# Patient Record
Sex: Male | Born: 1950
Health system: Southern US, Community
[De-identification: ages and names within clinical notes are randomized; demographics above are authoritative.]

## PROBLEM LIST (undated history)

## (undated) DIAGNOSIS — I2510 Atherosclerotic heart disease of native coronary artery without angina pectoris: Secondary | ICD-10-CM

## (undated) DIAGNOSIS — K589 Irritable bowel syndrome without diarrhea: Secondary | ICD-10-CM

## (undated) DIAGNOSIS — C801 Malignant (primary) neoplasm, unspecified: Secondary | ICD-10-CM

## (undated) DIAGNOSIS — D126 Benign neoplasm of colon, unspecified: Secondary | ICD-10-CM

## (undated) DIAGNOSIS — Z8042 Family history of malignant neoplasm of prostate: Secondary | ICD-10-CM

## (undated) DIAGNOSIS — R911 Solitary pulmonary nodule: Secondary | ICD-10-CM

## (undated) DIAGNOSIS — I10 Essential (primary) hypertension: Secondary | ICD-10-CM

## (undated) DIAGNOSIS — K648 Other hemorrhoids: Secondary | ICD-10-CM

## (undated) DIAGNOSIS — E785 Hyperlipidemia, unspecified: Secondary | ICD-10-CM

## (undated) DIAGNOSIS — Z8052 Family history of malignant neoplasm of bladder: Secondary | ICD-10-CM

## (undated) DIAGNOSIS — Z8679 Personal history of other diseases of the circulatory system: Secondary | ICD-10-CM

## (undated) DIAGNOSIS — K219 Gastro-esophageal reflux disease without esophagitis: Secondary | ICD-10-CM

## (undated) DIAGNOSIS — N400 Enlarged prostate without lower urinary tract symptoms: Secondary | ICD-10-CM

## (undated) DIAGNOSIS — R3129 Other microscopic hematuria: Secondary | ICD-10-CM

## (undated) DIAGNOSIS — N419 Inflammatory disease of prostate, unspecified: Secondary | ICD-10-CM

## (undated) HISTORY — DX: Gilbert syndrome: E80.4

## (undated) HISTORY — DX: Solitary pulmonary nodule: R91.1

## (undated) HISTORY — DX: Personal history of other diseases of the circulatory system: Z86.79

## (undated) HISTORY — DX: Family history of malignant neoplasm of bladder: Z80.52

## (undated) HISTORY — DX: Hyperlipidemia, unspecified: E78.5

## (undated) HISTORY — PX: PROSTATE BIOPSY: SHX241

## (undated) HISTORY — PX: TONSILLECTOMY AND ADENOIDECTOMY: SUR1326

## (undated) HISTORY — DX: Benign neoplasm of colon, unspecified: D12.6

## (undated) HISTORY — DX: Other microscopic hematuria: R31.29

## (undated) HISTORY — DX: Atherosclerotic heart disease of native coronary artery without angina pectoris: I25.10

## (undated) HISTORY — PX: CYSTOSCOPY: SUR368

## (undated) HISTORY — DX: Other hemorrhoids: K64.8

## (undated) HISTORY — PX: VASECTOMY: SHX75

## (undated) HISTORY — DX: Irritable bowel syndrome, unspecified: K58.9

## (undated) HISTORY — DX: Inflammatory disease of prostate, unspecified: N41.9

## (undated) HISTORY — DX: Gastro-esophageal reflux disease without esophagitis: K21.9

## (undated) HISTORY — DX: Family history of malignant neoplasm of prostate: Z80.42

---

## 2004-05-14 HISTORY — PX: COLONOSCOPY W/ POLYPECTOMY: SHX1380

## 2004-06-23 ENCOUNTER — Ambulatory Visit: Payer: Self-pay | Admitting: Family Medicine

## 2004-07-28 ENCOUNTER — Ambulatory Visit: Payer: Self-pay | Admitting: Gastroenterology

## 2004-08-03 ENCOUNTER — Encounter: Payer: Self-pay | Admitting: Internal Medicine

## 2004-08-03 ENCOUNTER — Ambulatory Visit: Payer: Self-pay | Admitting: Gastroenterology

## 2005-01-12 ENCOUNTER — Ambulatory Visit: Payer: Self-pay | Admitting: Internal Medicine

## 2005-06-08 ENCOUNTER — Ambulatory Visit: Payer: Self-pay | Admitting: Internal Medicine

## 2005-12-19 ENCOUNTER — Ambulatory Visit: Payer: Self-pay | Admitting: Internal Medicine

## 2006-02-28 ENCOUNTER — Ambulatory Visit: Payer: Self-pay | Admitting: Internal Medicine

## 2006-02-28 LAB — CONVERTED CEMR LAB
AST: 19 units/L (ref 0–37)
Chol/HDL Ratio, serum: 4.4
Cholesterol: 184 mg/dL (ref 0–200)

## 2006-04-12 ENCOUNTER — Ambulatory Visit: Payer: Self-pay | Admitting: Internal Medicine

## 2006-08-01 ENCOUNTER — Ambulatory Visit: Payer: Self-pay | Admitting: Internal Medicine

## 2006-09-24 ENCOUNTER — Ambulatory Visit: Payer: Self-pay | Admitting: Internal Medicine

## 2006-09-24 LAB — CONVERTED CEMR LAB
ALT: 33 units/L (ref 0–40)
HDL: 43.3 mg/dL (ref >39.0)
Total CHOL/HDL Ratio: 3.8
Triglycerides: 105 mg/dL (ref 0–149)

## 2007-02-24 ENCOUNTER — Ambulatory Visit: Payer: Self-pay | Admitting: Internal Medicine

## 2007-02-24 LAB — CONVERTED CEMR LAB
Glucose, Urine, Semiquant: NEGATIVE
Ketones, urine, test strip: NEGATIVE
Nitrite: NEGATIVE
Protein, U semiquant: NEGATIVE
Specific Gravity, Urine: <1.005

## 2007-06-17 ENCOUNTER — Ambulatory Visit: Payer: Self-pay | Admitting: Internal Medicine

## 2007-07-18 ENCOUNTER — Ambulatory Visit: Payer: Self-pay | Admitting: Internal Medicine

## 2007-07-18 LAB — CONVERTED CEMR LAB
Cholesterol: 166 mg/dL (ref 0–200)
HDL: 52.1 mg/dL (ref >39.0)
LDL Cholesterol: 102 mg/dL — ABNORMAL HIGH (ref 0–99)
Total CHOL/HDL Ratio: 3.2
Triglycerides: 59 mg/dL (ref 0–149)

## 2007-07-31 ENCOUNTER — Ambulatory Visit: Payer: Self-pay | Admitting: Internal Medicine

## 2007-07-31 DIAGNOSIS — E7800 Pure hypercholesterolemia, unspecified: Secondary | ICD-10-CM | POA: Insufficient documentation

## 2007-07-31 DIAGNOSIS — E785 Hyperlipidemia, unspecified: Secondary | ICD-10-CM | POA: Insufficient documentation

## 2007-07-31 DIAGNOSIS — I10 Essential (primary) hypertension: Secondary | ICD-10-CM | POA: Insufficient documentation

## 2007-07-31 LAB — CONVERTED CEMR LAB
Glucose, Urine, Semiquant: NEGATIVE
HDL goal, serum: 40 mg/dL
Ketones, urine, test strip: NEGATIVE
LDL Goal: 130 mg/dL
Nitrite: NEGATIVE
Specific Gravity, Urine: 1.02
pH: 6.5

## 2008-02-19 ENCOUNTER — Telehealth (INDEPENDENT_AMBULATORY_CARE_PROVIDER_SITE_OTHER): Payer: Self-pay | Admitting: *Deleted

## 2008-02-25 ENCOUNTER — Encounter: Payer: Self-pay | Admitting: Internal Medicine

## 2008-03-10 ENCOUNTER — Ambulatory Visit: Payer: Self-pay | Admitting: Internal Medicine

## 2008-03-10 LAB — CONVERTED CEMR LAB
Bilirubin Urine: NEGATIVE
Ketones, urine, test strip: NEGATIVE
Nitrite: NEGATIVE
Specific Gravity, Urine: 1.01
Urobilinogen, UA: 0.2

## 2008-03-12 ENCOUNTER — Telehealth (INDEPENDENT_AMBULATORY_CARE_PROVIDER_SITE_OTHER): Payer: Self-pay | Admitting: *Deleted

## 2008-03-12 ENCOUNTER — Encounter (INDEPENDENT_AMBULATORY_CARE_PROVIDER_SITE_OTHER): Payer: Self-pay | Admitting: *Deleted

## 2008-03-12 LAB — CONVERTED CEMR LAB: PSA: 13.08 ng/mL — ABNORMAL HIGH (ref 0.10–4.00)

## 2008-04-05 ENCOUNTER — Encounter: Payer: Self-pay | Admitting: Internal Medicine

## 2008-04-16 ENCOUNTER — Encounter: Payer: Self-pay | Admitting: Internal Medicine

## 2008-05-21 ENCOUNTER — Ambulatory Visit: Payer: Self-pay | Admitting: Internal Medicine

## 2008-08-26 ENCOUNTER — Ambulatory Visit: Payer: Self-pay | Admitting: Internal Medicine

## 2008-09-13 ENCOUNTER — Ambulatory Visit: Payer: Self-pay | Admitting: Internal Medicine

## 2008-09-13 LAB — CONVERTED CEMR LAB
Cholesterol: 189 mg/dL (ref 0–200)
LDL Cholesterol: 114 mg/dL — ABNORMAL HIGH (ref 0–99)
VLDL: 20.6 mg/dL (ref 0.0–40.0)

## 2008-09-14 ENCOUNTER — Encounter (INDEPENDENT_AMBULATORY_CARE_PROVIDER_SITE_OTHER): Payer: Self-pay | Admitting: *Deleted

## 2008-09-20 ENCOUNTER — Ambulatory Visit: Payer: Self-pay | Admitting: Internal Medicine

## 2009-02-04 ENCOUNTER — Ambulatory Visit: Payer: Self-pay | Admitting: Internal Medicine

## 2009-02-04 DIAGNOSIS — B009 Herpesviral infection, unspecified: Secondary | ICD-10-CM | POA: Insufficient documentation

## 2009-02-04 LAB — CONVERTED CEMR LAB
Bilirubin Urine: NEGATIVE
Glucose, Urine, Semiquant: NEGATIVE
Ketones, urine, test strip: NEGATIVE
pH: 6

## 2009-04-19 ENCOUNTER — Ambulatory Visit: Payer: Self-pay | Admitting: Family Medicine

## 2009-09-28 ENCOUNTER — Telehealth (INDEPENDENT_AMBULATORY_CARE_PROVIDER_SITE_OTHER): Payer: Self-pay | Admitting: *Deleted

## 2009-10-03 ENCOUNTER — Ambulatory Visit: Payer: Self-pay | Admitting: Internal Medicine

## 2009-10-03 LAB — CONVERTED CEMR LAB
Bilirubin Urine: NEGATIVE
Glucose, Urine, Semiquant: NEGATIVE
Specific Gravity, Urine: 1.02
WBC Urine, dipstick: NEGATIVE
pH: 5

## 2009-10-08 LAB — CONVERTED CEMR LAB
ALT: 19 units/L (ref 0–53)
AST: 21 units/L (ref 0–37)
Alkaline Phosphatase: 67 units/L (ref 39–117)
BUN: 17 mg/dL (ref 6–23)
Basophils Absolute: 0 10*3/uL (ref 0.0–0.1)
Bilirubin, Direct: 0.2 mg/dL (ref 0.0–0.3)
Cholesterol: 177 mg/dL (ref 0–200)
Creatinine, Ser: 0.9 mg/dL (ref 0.4–1.5)
GFR calc non Af Amer: 88.11 mL/min (ref >60)
Glucose, Bld: 100 mg/dL — ABNORMAL HIGH (ref 70–99)
HCT: 43.4 % (ref 39.0–52.0)
Lymphs Abs: 1.6 10*3/uL (ref 0.7–4.0)
Monocytes Absolute: 0.5 10*3/uL (ref 0.1–1.0)
Monocytes Relative: 7.7 % (ref 3.0–12.0)
Neutrophils Relative %: 63.4 % (ref 43.0–77.0)
PSA: 2.27 ng/mL (ref 0.10–4.00)
Platelets: 210 10*3/uL (ref 150.0–400.0)
Potassium: 4.2 meq/L (ref 3.5–5.1)
RDW: 12.6 % (ref 11.5–14.6)
TSH: 0.48 microintl units/mL (ref 0.35–5.50)
Total Bilirubin: 1.9 mg/dL — ABNORMAL HIGH (ref 0.3–1.2)
Total Protein: 7 g/dL (ref 6.0–8.3)

## 2009-10-14 ENCOUNTER — Encounter (INDEPENDENT_AMBULATORY_CARE_PROVIDER_SITE_OTHER): Payer: Self-pay | Admitting: *Deleted

## 2009-10-14 ENCOUNTER — Ambulatory Visit: Payer: Self-pay | Admitting: Internal Medicine

## 2009-10-14 LAB — CONVERTED CEMR LAB: OCCULT 2: NEGATIVE

## 2009-10-20 ENCOUNTER — Ambulatory Visit: Payer: Self-pay | Admitting: Internal Medicine

## 2009-10-20 DIAGNOSIS — Z860100 Personal history of colon polyps, unspecified: Secondary | ICD-10-CM | POA: Insufficient documentation

## 2009-10-20 DIAGNOSIS — Z87448 Personal history of other diseases of urinary system: Secondary | ICD-10-CM | POA: Insufficient documentation

## 2009-10-20 DIAGNOSIS — L570 Actinic keratosis: Secondary | ICD-10-CM | POA: Insufficient documentation

## 2009-10-25 ENCOUNTER — Encounter (INDEPENDENT_AMBULATORY_CARE_PROVIDER_SITE_OTHER): Payer: Self-pay | Admitting: *Deleted

## 2009-11-17 ENCOUNTER — Encounter (INDEPENDENT_AMBULATORY_CARE_PROVIDER_SITE_OTHER): Payer: Self-pay

## 2009-11-22 ENCOUNTER — Ambulatory Visit: Payer: Self-pay | Admitting: Internal Medicine

## 2009-12-06 ENCOUNTER — Ambulatory Visit: Payer: Self-pay | Admitting: Internal Medicine

## 2010-02-10 ENCOUNTER — Ambulatory Visit: Payer: Self-pay | Admitting: Internal Medicine

## 2010-02-10 LAB — CONVERTED CEMR LAB
Bilirubin Urine: NEGATIVE
Ketones, urine, test strip: NEGATIVE
Protein, U semiquant: NEGATIVE
Urobilinogen, UA: 0.2

## 2010-02-17 ENCOUNTER — Encounter (INDEPENDENT_AMBULATORY_CARE_PROVIDER_SITE_OTHER): Payer: Self-pay | Admitting: *Deleted

## 2010-04-04 ENCOUNTER — Ambulatory Visit: Payer: Self-pay | Admitting: Internal Medicine

## 2010-04-04 DIAGNOSIS — K589 Irritable bowel syndrome without diarrhea: Secondary | ICD-10-CM | POA: Insufficient documentation

## 2010-04-04 LAB — CONVERTED CEMR LAB
IgA: 326 mg/dL (ref 68–378)
Tissue Transglutaminase Ab, IgA: 6 units (ref <20)

## 2010-04-05 ENCOUNTER — Telehealth: Payer: Self-pay | Admitting: Internal Medicine

## 2010-05-19 ENCOUNTER — Ambulatory Visit
Admission: RE | Admit: 2010-05-19 | Discharge: 2010-05-19 | Payer: Self-pay | Source: Home / Self Care | Attending: Internal Medicine | Admitting: Internal Medicine

## 2010-06-14 NOTE — Letter (Signed)
Summary: [facility] Instructions  [facility]  [address] [place], [place] [address]   Phone: [phone]  Fax: [phone]       Nicholas Paul    20-Nov-1950    MRN: [id number]        Procedure Day /Date:  Tuesday 12/06/2009     Arrival Time:  3:00pm     Procedure Time:  4:00pm     Location of Procedure:                    _x _   [facility] (4th Floor)                        PREPARATION FOR COLONOSCOPY WITH MOVIPREP   Starting 5 days prior to your procedure Thursday 12/01/2009 do not eat nuts, seeds, popcorn, corn, beans, peas,  salads, or any raw vegetables.  Do not take any fiber supplements (e.g. Metamucil, Citrucel, and Benefiber).  THE DAY BEFORE YOUR PROCEDURE         DATE: Monday 7/25  1.  Drink clear liquids the entire day-NO SOLID FOOD  2.  Do not drink anything colored red or purple.  Avoid juices with pulp.  No orange juice.  3.  Drink at least 64 oz. (8 glasses) of fluid/clear liquids during the day to prevent dehydration and help the prep work efficiently.  CLEAR LIQUIDS INCLUDE: Water Jello Ice Popsicles Tea (sugar ok, no milk/cream) Powdered fruit flavored drinks Coffee (sugar ok, no milk/cream) Gatorade Juice: apple, white grape, white cranberry  Lemonade Clear bullion, consomm, broth Carbonated beverages (any kind) Strained chicken noodle soup Hard Candy                             4.  In the morning, mix first dose of MoviPrep solution:    Empty 1 Pouch A and 1 Pouch B into the disposable container    Add lukewarm drinking water to the top line of the container. Mix to dissolve    Refrigerate (mixed solution should be used within 24 hrs)  5.  Begin drinking the prep at 5:00 p.m. The MoviPrep container is divided by 4 marks.   Every 15 minutes drink the solution down to the next mark (approximately 8 oz) until the full liter is complete.   6.  Follow completed prep with 16 oz of clear liquid of your choice  (Nothing red or purple).  Continue to drink clear liquids until bedtime.  7.  Before going to bed, mix second dose of MoviPrep solution:    Empty 1 Pouch A and 1 Pouch B into the disposable container    Add lukewarm drinking water to the top line of the container. Mix to dissolve    Refrigerate  THE DAY OF YOUR PROCEDURE      DATE: Tuesday 7/26  Beginning at 11:00am  (5 hours before procedure):         1. Every 15 minutes, drink the solution down to the next mark (approx 8 oz) until the full liter is complete.  2. Follow completed prep with 16 oz. of clear liquid of your choice.    3. You may drink clear liquids until 2:00pm  (2 HOURS BEFORE PROCEDURE).   MEDICATION INSTRUCTIONS  Unless otherwise instructed, you should take regular prescription medications with a small sip of water   as early as possible the morning of your  procedure.         OTHER INSTRUCTIONS  You will need a responsible adult at least 61 years of age to accompany you and drive you home.   This person must remain in the waiting room during your procedure.  Wear loose fitting clothing that is easily removed.  Leave jewelry and other valuables at home.  However, you may wish to bring a book to read or  an iPod/MP3 player to listen to music as you wait for your procedure to start.  Remove all body piercing jewelry and leave at home.  Total time from sign-in until discharge is approximately 2-3 hours.  You should go home directly after your procedure and rest.  You can resume normal activities the  day after your procedure.  The day of your procedure you should not:   Drive   Make legal decisions   Operate machinery   Drink alcohol   Return to work  You will receive specific instructions about eating, activities and medications before you leave.    The above instructions have been reviewed and explained to me by   Ulis Rias RN  November 23, 1960 9:19 AM     I fully understand and can  verbalize these instructions _____________________________ Date _________

## 2010-06-14 NOTE — Assessment & Plan Note (Signed)
Summary: DOESNT FEEL GOOD--WANTS BP CHECKED--JUST RETURNED FROM OUT OF...   Vital Signs:  Patient profile:   60 year old male Height:      70.25 inches Weight:      177.13 pounds BMI:     25.33 Pulse rate:   84 / minute Pulse rhythm:   regular BP sitting:   134 / 84  (left arm) Cuff size:   large  Vitals Entered By: Army Fossa CMA (February 10, 2010 3:08 PM) CC: Pt here c/o lower back pain, constipation.  Comments [facility]    History of Present Illness: his chronic, well controlled constipation has been "acting up" for few weeks. He feels bloated and has occasional lower abdominal discomfort. Historically, this symptoms are related to prostatitis. He also has a low back ache bilaterally, not sleeping well, BP today seems slightly elevated compared 2 previous visits.  ROS Denies any fevers No nausea vomiting had the single episode of diarrhea today, thinks related to constipation for the last few days. Has seen blood in the toilet paper a couple of weeks ago, history of hemorrhoids, not  a new symptom. No dysuria or gross hematuria No difficulty urinating but he has developed nocturia in the last few days which is unusual for him  Current Medications (verified): 1)  Quinapril Hcl 40 Mg  Tabs (Quinapril Hcl) .... Take 1 Tab Once Daily 2)  Basa 81mg   Allergies: 1)  ! Cardura  Past History:  Past Medical History: Reviewed history from 10/20/2009 and no changes required. chronic microscopic hematuria, negative   Urology  evaluation X2 elevated liver enzymes , PMH of HYPERLIPIDEMIA (ICD-272.2):NMR 2006: 138(1712/1318), HDL 34, TG 69.LDL goal = < 100, ideally < 85. Framingham LDL goal = < 130. PROSTATITIS NOS (ICD-601.9), chronic /recurrent Colonic polyps, hx of, 3 adenomatous polyps 08/03/2004 Gilbert's Syndrome  Past Surgical History: Tonsillectomy, post op complication of  rheumatic fever with resulting murmur Vasectomy cystoscopy X ?4 & urogram,  Dr Vonita Moss colonoscopy ? polyps  07/2004, Dr Victorino Dike coloscopy again 11-2009, negative  Social History: Never Smoked Alcohol use-yes two to three beers or wine once daily ,  socially Occupation: Best boy, Occupational hygienist Married Regular exercise-yes: CVE 1-2X/ week  Physical Exam  General:  alert and well-developed.   Lungs:  Normal respiratory effort, chest expands symmetrically. Lungs are clear to auscultation, no crackles or wheezes. Heart:  Normal rate and regular rhythm. S1 and S2 normal without gallop, murmur, click, rub.S4 Abdomen:  soft, non-tender, normal bowel sounds, no distention, no masses, no guarding, no rigidity, no hepatomegaly, and no splenomegaly.   Rectal:  No external abnormalities noted. Normal sphincter tone. No rectal masses or tenderness. Prostate:  prostate is slightly enlarged but symmetric, not nodular. The left side is slightly tender Extremities:  no lower extremity edema   Impression & Recommendations:  Problem # 1:  ? of PROSTATITIS NOS (ICD-601.9)  presents with increased constipation, low back pain, occasional abdominal discomfort. Historically, the symptoms are associated with prostatitis. On exam, the left side of the prostate is indeed slightly tender Prostatitis? Plan: Treat with antibiotics, if not better will have to consider other etiologies or further workup.  Orders: UA Dipstick w/o Micro (manual) (98119)  Complete Medication List: 1)  Quinapril Hcl 40 Mg Tabs (Quinapril hcl) .... Take 1 tab once daily 2)  Basa 81mg   3)  Ciprofloxacin Hcl 500 Mg Tabs (Ciprofloxacin hcl) .... One by mouth twice a day  Patient Instructions: 1)  take plenty of  fluids 2)  Take Cipro for 10 days, avoid excessive sun exposure 3)  Call any time if you feel worse, have fever, increased abdominal pain or  if you are not better in 10 days Prescriptions: CIPROFLOXACIN HCL 500 MG TABS (CIPROFLOXACIN HCL) one by mouth twice a day  #20 x 0   Entered and  Authorized by:   Nolon Rod. Paz MD   Signed by:   Nolon Rod. Paz MD on 02/10/2010   Method used:   Electronically to        [facility]  [phone]* (retail)       [address] [place], [place]  [address]       Ph: [phone] or [phone]       Fax: [phone]   RxID:   [id number]   Laboratory Results   Urine Tests    Routine Urinalysis   Color: yellow Appearance: Clear Glucose: negative   (Normal Range: Negative) Bilirubin: negative   (Normal Range: Negative) Ketone: negative   (Normal Range: Negative) Spec. Gravity: 1.020   (Normal Range: 1.003-1.035) Blood: negative   (Normal Range: Negative) pH: 7.0   (Normal Range: 5.0-8.0) Protein: negative   (Normal Range: Negative) Urobilinogen: 0.2   (Normal Range: 0-1) Nitrite: negative   (Normal Range: Negative) Leukocyte Esterace: negative   (Normal Range: Negative)    Comments: Army Fossa CMA  February 10, 2010 3:11 PM

## 2010-06-14 NOTE — Procedures (Signed)
Summary: Colonoscopy: Adenoma   Colonoscopy  Procedure date:  08/03/2004  Findings:      Adenoma x 3 (29mm-12mm sessile polyps)     Procedures Next Due Date:    Colonoscopy: 08/2007  Patient Name: Nicholas Paul, Nicholas Paul MRN:  Procedure Procedures: Colonoscopy CPT: [address].  Personnel: Endoscopist: Ulyess Mort, MD.  Exam Location: Exam performed in Outpatient Clinic. Outpatient  Patient Consent: Procedure, Alternatives, Risks and Benefits discussed, consent obtained, from patient. Consent was obtained by the RN.  Indications  Average Risk Screening Routine.  History  Current Medications: Patient is not currently taking Coumadin.  Pre-Exam Physical: Entire physical exam was normal.  Exam Exam: Extent of exam reached: Cecum, extent intended: Cecum.  The cecum was identified by appendiceal orifice and IC valve. Colon retroflexion performed. Images were not taken. ASA Classification: II. Tolerance: good.  Monitoring: Pulse and BP monitoring, Oximetry used. Supplemental O2 given.  Colon Prep Prep results: fair, adequate exam.  Sedation Meds: Patient assessed and found to be appropriate for moderate (conscious) sedation. Fentanyl 100 mcg. given IV. Versed 10 mg. given IV.  Findings POLYP: Descending Colon, Maximum size: 6 mm. sessile polyp. Procedure:  snare with cautery, removed, retrieved, Polyp sent to pathology.  POLYP: Cecum, Maximum size: 8 mm. sessile polyp. Procedure:  snare with cautery, removed, retrieved, sent to pathology. ICD9: Colon Polyps: 211.3.  POLYP: Descending Colon, Maximum size: 12 mm. sessile polyp. Procedure:  snare with cautery, removed, retrieved, sent to pathology. ICD9: Colon Polyps: 211.3.   Assessment Abnormal examination, see findings above.  Diagnoses: 211.3: Colon Polyps.   Events  Unplanned Interventions: No intervention was required.  Unplanned Events: There were no complications. Plans Medication Plan: Await  pathology. Continue current medications.  Patient Education: Patient given standard instructions for: Polyps. Yearly hemoccult testing recommended. Patient instructed to get routine colonoscopy every 3 years.  Disposition: After procedure patient sent to recovery. After recovery patient sent home.   CC:   Marga Melnick, MD  This report was created from the original endoscopy report, which was reviewed and signed by the above listed endoscopist.

## 2010-06-14 NOTE — Progress Notes (Signed)
Summary: refill & lab order  Phone Note Refill Request Message from:  Patient on Sep 28, 2009 11:46 AM  Refills Requested: Medication #1:  QUINAPRIL HCL 40 MG  TABS take 1 tab once daily patient needs refill he knows he needs lab before refill - lab appt [id number] -need lab order pt request psa- cpx [id number] [address]  -  Initial call taken by: Okey Regal Spring,  Sep 28, 2009 11:48 AM  Follow-up for Phone Call        Lipid,Hep,BMP,CBCD,TSH,PSA, Stool Cards, Udip 272.4/401.9/601.0/v70.0 Follow-up by: Shonna Chock,  Sep 28, 2009 1:53 PM  Additional Follow-up for Phone Call Additional follow up Details #1::        lab order added to appt Additional Follow-up by: Okey Regal Spring,  Sep 28, 2009 4:29 PM    Prescriptions: QUINAPRIL HCL 40 MG  TABS (QUINAPRIL HCL) take 1 tab once daily  #90 Tablet x 0   Entered by:   Shonna Chock   Authorized by:   Marga Melnick MD   Signed by:   Shonna Chock on 09/28/2009   Method used:   Electronically to        [facility]  [phone]* (retail)       [address] [place], [place]  [address]       Ph: [phone] or [phone]       Fax: [phone]   RxID:   [id number]

## 2010-06-14 NOTE — Miscellaneous (Signed)
Summary: Lec previsit  Clinical Lists Changes  Medications: Added new medication of MOVIPREP 100 GM  SOLR (PEG-KCL-NACL-NASULF-NA ASC-C) As per prep instructions. - Signed Rx of MOVIPREP 100 GM  SOLR (PEG-KCL-NACL-NASULF-NA ASC-C) As per prep instructions.;  #1 x 0;  Signed;  Entered by: Ulis Rias RN;  Authorized by: Iva Boop MD, [facility];  Method used: [facility]  [phone]*, [address] [place], [place]  [address], Ph: [phone] or [phone], Fax: [phone] Observations: Added new observation of ALLERGY REV: Done (11/22/2009 9:00)    Prescriptions: MOVIPREP 100 GM  SOLR (PEG-KCL-NACL-NASULF-NA ASC-C) As per prep instructions.  #1 x 0   Entered by:   Ulis Rias RN   Authorized by:   Iva Boop MD, [facility]   Signed by:   Ulis Rias RN on 11/22/2009   Method used:   Electronically to        [facility]  [phone]* (retail)       [address] [place], [place]  [address]       Ph: [phone] or [phone]       Fax: [phone]   RxID:   [id number]

## 2010-06-14 NOTE — Letter (Signed)
Summary: New Patient letter  [facility]  [address] [place], [place] [address]   Phone: [phone]  Fax: [phone]       02/17/2010 MRN: [id number]  Nicholas Paul [address] [place], [place]  [address]  Dear [name],  Welcome to the Gastroenterology Division at [facility].    You are scheduled to see Dr.  Leone Payor on 04-04-10 at 8:45a.m. on the 3rd floor at [facility], [address]  We ask that you try to arrive at our office 15 minutes prior to your appointment time to allow for check-in.  We would like you to complete the enclosed self-administered evaluation form prior to your visit and bring it with you on the day of your appointment.  We will review it with you.  Also, please bring a complete list of all your medications or, if you prefer, bring the medication bottles and we will list them.  Please bring your insurance card so that we may make a copy of it.  If your insurance requires a referral to see a specialist, please bring your referral form from your primary care physician.  Co-payments are due at the time of your visit and may be paid by cash, check or credit card.     Your office visit will consist of a consult with your physician (includes a physical exam), any laboratory testing he/she may order, scheduling of any necessary diagnostic testing (e.g. x-ray, ultrasound, CT-scan), and scheduling of a procedure (e.g. Endoscopy, Colonoscopy) if required.  Please allow enough time on your schedule to allow for any/all of these possibilities.    If you cannot keep your appointment, please call [phone] to cancel or reschedule prior to your appointment date.  This allows [place] the opportunity to schedule an appointment for another patient in need of care.  If you do not cancel or reschedule by 5 p.m. the business day prior to your appointment date, you will be charged a $50.00 late cancellation/no-show fee.    Thank you for  choosing [facility] for your medical needs.  We appreciate the opportunity to care for you.  Please visit [place] at our website  to learn more about our practice.                     Sincerely,                                                             The Gastroenterology Division

## 2010-06-14 NOTE — Assessment & Plan Note (Signed)
Summary: cpx /cbs   Vital Signs:  Patient profile:   60 year old male Height:      70.25 inches Weight:      178.4 pounds Temp:     98.2 degrees F oral Resp:     16 per minute BP sitting:   112 / 70  (left arm) Cuff size:   large  Vitals Entered By: Shonna Chock (October 20, 2009 11:26 AM)  CC: CPX and discuss labs (copy given), General Medical Evaluation, Lipid Management Comments REVIEWED MED LIST, PATIENT AGREED DOSE AND INSTRUCTION CORRECT    Primary Care Provider:  Alwyn Ren  CC:  CPX and discuss labs (copy given), General Medical Evaluation, and Lipid Management.  History of Present Illness: Mr. Luvenia Heller is here for a physical; he is asymptomatic."Best I've felt in 25 years".  Lipid Management History:      Positive NCEP/ATP III risk factors include male age 22 years old or older and hypertension.  Negative NCEP/ATP III risk factors include non-diabetic, no family history for ischemic heart disease, non-tobacco-user status, no ASHD (atherosclerotic heart disease), no prior stroke/TIA, no peripheral vascular disease, and no history of aortic aneurysm.     Preventive Screening-Counseling & Management  Caffeine-Diet-Exercise     Does Patient Exercise: yes  Allergies: 1)  ! Cardura  Past History:  Past Medical History: chronic microscopic hematuria, negative   Urology  evaluation X2 elevated liver enzymes , PMH of HYPERLIPIDEMIA (ICD-272.2):NMR 2006: 138(1712/1318), HDL 34, TG 69.LDL goal = < 100, ideally < 85. Framingham LDL goal = < 130. PROSTATITIS NOS (ICD-601.9), chronic /recurrent Colonic polyps, hx of, 3 adenomatous polyps 08/03/2004 Gilbert's Syndrome  Past Surgical History: Tonsillectomy, post op complication of  rheumatic fever with resulting murmur Vasectomy cystoscopy X ?4 & urogram, Dr Vonita Moss colonoscopy ? polyps  07/2004, Dr Victorino Dike  Family History: father: bladder cancer @ 62 ,? exposed to dyes in [place] ,[place]  ,smoker brother: bladder cancer @ 57  , non smoker paternal aunt: bladder cancer mother: elevated cholesterol, HTN,smoker brother: HTN  Social History: Never Smoked Alcohol use-yes two to three beers or wine once daily as needed: socially Occupation: Best boy, Occupational hygienist Married Regular exercise-yes: CVE 1-2X/ week Does Patient Exercise:  yes  Review of Systems General:  Denies chills, fatigue, fever, sweats, and weight loss. Eyes:  Denies blurring, double vision, and vision loss-both eyes. ENT:  Complains of decreased hearing; denies difficulty swallowing, hoarseness, nasal congestion, and sinus pressure. CV:  Denies chest pain or discomfort, leg cramps with exertion, palpitations, and shortness of breath with exertion. Resp:  Denies cough, shortness of breath, and sputum productive. GI:  Complains of hemorrhoids; denies abdominal pain, bloody stools, change in bowel habits, constipation, dark tarry stools, diarrhea, and indigestion; 2006 Colonoscopy report & Path report copy provided. GU:  Denies decreased libido, discharge, dysuria, erectile dysfunction, hematuria, nocturia, urinary frequency, and urinary hesitancy; Cystoscopy <  6 months ago. MS:  Complains of joint pain; denies joint redness, joint swelling, low back pain, mid back pain, and thoracic pain; Intermittent  activity related pain L shoulder. Derm:  Denies changes in nail beds, dryness, hair loss, and lesion(s). Neuro:  Denies headaches, numbness, and tingling. Psych:  Denies anxiety and depression. Endo:  Denies cold intolerance, excessive hunger, excessive thirst, excessive urination, and heat intolerance. Heme:  Denies abnormal bruising and bleeding. Allergy:  Denies itching eyes and sneezing.  Physical Exam  General:  well-nourished; alert,appropriate and cooperative throughout examination Head:  Normocephalic and atraumatic without  obvious abnormalities. Keratoses of scalp Eyes:  No corneal or conjunctival inflammation noted. Perrla. Funduscopic  exam benign, without hemorrhages, exudates or papilledema. Ears:  External ear exam shows no significant lesions or deformities.  Otoscopic examination reveals clear canals, tympanic membranes are intact bilaterally without bulging, retraction, inflammation or discharge. Hearing is grossly normal bilaterally. Nose:  External nasal examination shows no deformity or inflammation. Nasal mucosa are pink and moist without lesions or exudates. Mouth:  Oral mucosa and oropharynx without lesions or exudates.  Teeth in good repair. Neck:  No deformities, masses, or tenderness noted. Lungs:  Normal respiratory effort, chest expands symmetrically. Lungs are clear to auscultation, no crackles or wheezes. Heart:  Normal rate and regular rhythm. S1 and S2 normal without gallop, murmur, click, rub.S4 Abdomen:  Bowel sounds positive,abdomen soft and non-tender without masses, organomegaly or hernias noted. Rectal:  Referred for overdue colonoscopy Genitalia:  Dr Vonita Moss Prostate:  Dr Vonita Moss Msk:  No deformity or scoliosis noted of thoracic or lumbar spine.   Pulses:  R and L carotid,radial,dorsalis pedis and posterior tibial pulses are full and equal bilaterally Extremities:  No clubbing, cyanosis, edema, or deformity noted with normal full range of motion of all joints.   Neurologic:  alert & oriented X3 and DTRs symmetrical and normal.   Skin:  See scalp Cervical Nodes:  No lymphadenopathy noted Axillary Nodes:  No palpable lymphadenopathy Psych:  memory intact for recent and remote, normally interactive, and good eye contact.     Impression & Recommendations:  Problem # 1:  ROUTINE GENERAL MEDICAL EXAM@HEALTH  CARE FACL (ICD-V70.0)  Orders: EKG w/ Interpretation ([address]) Gastroenterology Referral (GI)  Problem # 2:  COLONIC POLYPS, HX OF (ICD-V12.72)  Colonoscopy overdue, SOC  Orders: Gastroenterology Referral (GI)  Problem # 3:  ACTINIC KERATOSIS (ICD-702.0)  Problem # 4:  HYPERTENSION,  ESSENTIAL NOS (ICD-401.9)  Controlled His updated medication list for this problem includes:    Quinapril Hcl 40 Mg Tabs (Quinapril hcl) .Marland Kitchen... Take 1 tab once daily  Orders: EKG w/ Interpretation ([address])  Problem # 5:  HYPERLIPIDEMIA (ICD-272.4)  Problem # 6:  HEMATURIA, MICROSCOPIC, HX OF (ICD-V13.09) in context of FH bladder cancer; negative Cystoscopy X 4. As per Dr Vonita Moss  Complete Medication List: 1)  Quinapril Hcl 40 Mg Tabs (Quinapril hcl) .... Take 1 tab once daily 2)  Basa 81mg   3)  Valacyclovir Hcl 1 Gm Tabs (Valacyclovir hcl) .... 2 two times a day x 1 day as needed  Lipid Assessment/Plan:      Based on NCEP/ATP III, the patient's risk factor category is "2 or more risk factors and a calculated 10 year CAD risk of < 20%".  The patient's lipid goals are as follows: Total cholesterol goal is 200; LDL cholesterol goal is 100; HDL cholesterol goal is 40; Triglyceride goal is 150.  His LDL cholesterol goal has been met.    Patient Instructions: 1)  See Dr Terri Piedra for scalp lesion.It is important that you exercise regularly at least 20 minutes 5 times a week. If you develop chest pain, have severe difficulty breathing, or feel very tired , stop exercising immediately and seek medical attention.Take an  81 mg coated Aspirin every day. Prescriptions: QUINAPRIL HCL 40 MG  TABS (QUINAPRIL HCL) take 1 tab once daily  #90 Tablet x 3   Entered and Authorized by:   Marga Melnick MD   Signed by:   Marga Melnick MD on 10/20/2009   Method used:   Faxed to .[facility] KitchenMarland Kitchen  [facility]  [phone]* (retail)       [address] [place], [place]  [address]       Ph: [phone] or [phone]       Fax: [phone]   RxID:   [id number]

## 2010-06-14 NOTE — Procedures (Signed)
Summary: Colonoscopy  Patient: Nicholas Paul Note: All result statuses are Final unless otherwise noted.  Tests: (1) Colonoscopy (COL)   COL Colonoscopy           DONE     [facility]     [address]     [place], [place]  [address]           COLONOSCOPY PROCEDURE REPORT           PATIENT:  [name], [name]  MR#:  [id number]     BIRTHDATE:  10/21/1950, 59 yrs. old  GENDER:  male     ENDOSCOPIST:  Iva Boop, MD, [facility]           PROCEDURE DATE:  12/06/2009     PROCEDURE:  Colonoscopy 295[address]     ASA CLASS:  Class II     INDICATIONS:  surveillance and high-risk screening, history of     pre-cancerous (adenomatous) colon polyps Three (3) adenomas     removed in 3/06, largest was 12 mm     MEDICATIONS:   Fentanyl 75 mcg IV, Versed 8 mg IV           DESCRIPTION OF PROCEDURE:   After the risks benefits and     alternatives of the procedure were thoroughly explained, informed     consent was obtained.  Digital rectal exam was performed and     revealed no abnormalities and normal prostate.   The LB CF-[id number]     [id number] endoscope was introduced through the anus and advanced to     the cecum, which was identified by both the appendix and ileocecal     valve, without limitations.  The quality of the prep was     excellent, using MoviPrep.  The instrument was then slowly     withdrawn as the colon was fully examined.     Insertion: 3:38 minutes Withdrawal: 9:36 minutes     <<PROCEDUREIMAGES>>           FINDINGS:  A normal appearing cecum, ileocecal valve, and     appendiceal orifice were identified. The ascending, hepatic     flexure, transverse, splenic flexure, descending, sigmoid colon,     and rectum appeared unremarkable.   Retroflexed views in the     rectum revealed internal hemorrhoids.    The scope was then     withdrawn from the patient and the procedure completed.           COMPLICATIONS:  None     ENDOSCOPIC IMPRESSION:     1) Normal colon, excellent prep     2) Internal hemorrhoids in the rectum     3) Personal history of adenoma removal (3, max size 12mm) 3/06           REPEAT EXAM:  In 5 year(s) for routine screening colonoscopy.           Iva Boop, MD, [facility]           CC:  Pecola Lawless, MD and The Patient           n.     eSIGNED:   Iva Boop at 12/06/2009 04:55 PM           [name], [name] [id number]  Note: An exclamation mark (!) indicates a result that was not dispersed into the flowsheet. Document Creation Date: 12/06/2009 4:55 PM _______________________________________________________________________  (1) Order result status: Final Collection or observation date-time: 12/06/2009 16:44 Requested  date-time:  Receipt date-time:  Reported date-time:  Referring Physician:   Ordering Physician: Stan Head [id number]) Specimen Source:  Source: Launa Grill Order Number: [id number] Lab site:   Appended Document: Colonoscopy    Clinical Lists Changes  Observations: Added new observation of COLONNXTDUE: 11/2014 (12/06/2009 17:00)      Appended Document: Colonoscopy   Colonoscopy  Procedure date:  12/03/2009  Findings:      ENDOSCOPIC IMPRESSION:     1) Normal colon, excellent prep     2) Internal hemorrhoids in the rectum     3) Personal history of adenoma removal (3, max size 12mm) 3/06  Procedures Next Due Date:    Colonoscopy: 12/2014

## 2010-06-14 NOTE — Letter (Signed)
Summary: Results Follow up Letter  [facility]  [address] [place], [place] [address]   Phone: [phone]  Fax: [phone]    10/14/2009 MRN: [id number]  Nicholas Paul [address] [place], [place]  [address]  Dear Mr. APPLEYARD,  The following are the results of your recent test(s):  Test         Result    Pap Smear:        Normal _____  Not Normal _____ Comments: ______________________________________________________ Cholesterol: LDL(Bad cholesterol):         Your goal is less than:         HDL (Good cholesterol):       Your goal is more than: Comments:  ______________________________________________________ Mammogram:        Normal _____  Not Normal _____ Comments:  ___________________________________________________________________ Hemoccult:        Normal __X___  Not normal _______ Comments:    _____________________________________________________________________ Other Tests:    We routinely do not discuss normal results over the telephone.  If you desire a copy of the results, or you have any questions about this information we can discuss them at your next office visit.   Sincerely,

## 2010-06-14 NOTE — Progress Notes (Signed)
Summary: celiac testing negative   Phone Note Outgoing Call   Summary of Call: celiac testing is negative no new recommendations Iva Boop MD, [facility]  April 05, 2010 5:23 PM   Follow-up for Phone Call        pt notified.  Pt states he will call us with an update to let Dr. Leone Payor know how well his advice is working! Follow-up by: Francee Piccolo CMA Duncan Dull),  April 10, 2010 4:56 PM

## 2010-06-14 NOTE — Letter (Signed)
Summary: Previsit letter  [facility]  [address] [place], [place] [address]   Phone: [phone]  Fax: [phone]       10/25/2009 MRN: [id number]  Nicholas Paul [address] [place], [place]  [address]  Dear [name],  Welcome to the Gastroenterology Division at [facility].    You are scheduled to see a nurse for your pre-procedure visit on 11/22/2009 at 9:00AM on the 3rd floor at [facility], [address]  We ask that you try to arrive at our office 15 minutes prior to your appointment time to allow for check-in.  Your nurse visit will consist of discussing your medical and surgical history, your immediate family medical history, and your medications.    Please bring a complete list of all your medications or, if you prefer, bring the medication bottles and we will list them.  We will need to be aware of both prescribed and over the counter drugs.  We will need to know exact dosage information as well.  If you are on blood thinners (Coumadin, Plavix, Aggrenox, Ticlid, etc.) please call our office today/prior to your appointment, as we need to consult with your physician about holding your medication.   Please be prepared to read and sign documents such as consent forms, a financial agreement, and acknowledgement forms.  If necessary, and with your consent, a friend or relative is welcome to sit-in on the nurse visit with you.  Please bring your insurance card so that we may make a copy of it.  If your insurance requires a referral to see a specialist, please bring your referral form from your primary care physician.  No co-pay is required for this nurse visit.     If you cannot keep your appointment, please call [phone] to cancel or reschedule prior to your appointment date.  This allows [place] the opportunity to schedule an appointment for another patient in need of care.    Thank you for choosing [facility] for your medical  needs.  We appreciate the opportunity to care for you.  Please visit [place] at our website  to learn more about our practice.                     Sincerely.                                                                                                                   The Gastroenterology Division

## 2010-06-14 NOTE — Assessment & Plan Note (Signed)
Summary: abd pain--ch.    History of Present Illness Visit Type: new patient  Primary GI MD: Stan Head MD [facility] Primary Provider: Marga Melnick, MD  Requesting Provider: na Chief Complaint: lower abd pain  History of Present Illness:   60 yo wm with sudden onset of bloating and sharp pain in abdomen (late August). He had alternation of diarrhea and constipation with it. He remained on his Metamucil which has helped his constipation since his 20's. He felt spasms but not pain in lwer abdomen. Some sharp pains at times also. These were new symptoms and it persisted over 8 weeks. At this time he does feel like it has resolved. In tha past he has had a prostatitis that has "thrown my system off", and other even minor ailments will lead to alteration in bowels. He took cipro (Rx in October)  for possible prostatitis with resolution of sxs over time (low back pain remniscent of prior prostatitis). He has tred gluten-free for a week at his sisters urging (she has similar sxs to him but sounds like she was negative for celiac). Chronic bloating and some constipation.     GI Review of Systems    Reports abdominal pain, acid reflux, bloating, heartburn, loss of appetite, and  nausea.     Location of  Abdominal pain: lower abdomen.    Denies belching, chest pain, dysphagia with liquids, dysphagia with solids, vomiting, vomiting blood, weight loss, and  weight gain.      Reports change in bowel habits, constipation, diarrhea, and  irritable bowel syndrome.     Denies anal fissure, black tarry stools, diverticulosis, fecal incontinence, heme positive stool, hemorrhoids, jaundice, light color stool, liver problems, rectal bleeding, and  rectal pain.    Clinical Reports Reviewed:  Colonoscopy:  12/03/2009:  ENDOSCOPIC IMPRESSION:     1) Normal colon, excellent prep     2) Internal hemorrhoids in the rectum     3) Personal history of adenoma removal (3, max size 12mm) 3/06  08/03/2004:   Adenoma x 3 (50mm-12mm sessile polyps)   Current Medications (verified): 1)  Quinapril Hcl 40 Mg  Tabs (Quinapril Hcl) .... Take 1 Tab Once Daily 2)  Aspirin 81 Mg Tabs (Aspirin) .... Take 1 Tablet By Mouth Once A Day 3)  Metamucil 30.9 % Powd (Psyllium) .... Two Teaspoons By Mouth in The Morning and Two Teaspoons By Mouth At Bedtime 4)  Pepcid Ac Maximum Strength 20 Mg Tabs (Famotidine) .... One By Mouth As Needed  Allergies (verified): 1)  ! Cardura  Past History:  Past Medical History: chronic microscopic hematuria, negative   Urology  evaluation X2 elevated liver enzymes , PMH of HYPERLIPIDEMIA (ICD-272.2):NMR 2006: 138(1712/1318), HDL 34, TG 69.LDL goal = < 100, ideally < 85. [place] LDL goal = < 130. PROSTATITIS NOS (ICD-601.9), chronic /recurrent Colonic polyps, hx of, 3 adenomatous polyps 08/03/2004 Gilbert's Syndrome Hemorrhoids Hypertension Irritable Bowel Syndrome GERD   Past Surgical History: Tonsillectomy, post op complication of  rheumatic fever with resulting murmur Vasectomy cystoscopy X ?4 & urogram, Dr Vonita Moss colonoscopy ? polyps  07/2004, Dr Victorino Dike colonoscopy again 11-2009, negative  Family History: father: bladder cancer @ 20 ,? exposed to dyes in [place] ,[place]  ,smoker brother: bladder cancer @ 3 , non smoker paternal aunt: bladder cancer mother: elevated cholesterol, HTN,smoker brother: HTN No FH of Colon Cancer:  Social History: Never Smoked Alcohol use-yes two to three beers or wine once daily ,  socially Occupation: Best boy, Occupational hygienist Married 2  Childern  Regular exercise-yes: CVE 1-2X/ week Daily Caffeine Use: 2 daily   Vital Signs:  Patient profile:   60 year old male Height:      70.25 inches Weight:      177 pounds BMI:     25.31 BSA:     1.99 Pulse rate:   84 / minute Pulse rhythm:   regular BP sitting:   128 / 80  (left arm) Cuff size:   regular  Vitals Entered By: Ok Anis CMA (April 04, 2010 8:44  AM)  Physical Exam  General:  Well developed, well nourished, no acute distress. Eyes:  anictreric Lungs:  clear ant Heart:  S1S2 no murmur Abdomen:  soft and nontender without HSM/mass   Impression & Recommendations:  Problem # 1:  IRRITABLE BOWEL SYNDROME (ICD-564.1) Assessment Deteriorated Longstanding hx since 20's, generally helped by fiber supplements. He has had a recent flare. He was helped by Abx (cipro) for suspected prostatitis. I ? if he could have had some small intestinal bacterial overgrowth. Other possibilities are celiac disease and ? FODMAPS (Fermentable oligo-, di- and momosaccharides and polyols)  Tx plan is to add MiraLax +/- Align depending upon response to MiraLax. He will look at [facility] foods and see about reducing them. If this not effective is to call back.  Orders: TLB-IgA (Immunoglobulin A) (82784-IGA) T-Tissue Transglutamase Ab IgA ([id number])  Patient Instructions: 1)  Add MiraLax 1 dose each day (dose = tablespoon or capful in 6-8 oz water or other liquid) to your regimen. You may need to reduce your metamucil. 2)  If this is not helpful enough after about 1 month then take Align 1 each day also for 1 month. If that helps then you may continue daily. 3)  Look up FODMAPS diet on Google and read about that, you could also benefit from eliminating some of those foods. 4)  Any ?'s call back with a message for Dr. Leone Payor 5)  Your physician has requested that you have the following labwork done today: TTG antibody and IgA level to evaluate for celiac disease. 6)  We will call with results. 7)  Copy sent to : Marga Melnick, MD 8)  The medication list was reviewed and reconciled.  All changed / newly prescribed medications were explained.  A complete medication list was provided to the patient / caregiver.

## 2010-06-15 NOTE — Assessment & Plan Note (Signed)
Summary: FLU SHOT//PH   Nurse Visit  CC: Flu shot./kb   Allergies: 1)  ! Cardura  Immunizations Administered:  Influenza Vaccine # 1:    Vaccine Type: Fluvax Non-MCR    Site: right deltoid    Mfr: Sanofi Pasteur    Dose: 0.5 ml    Route: IM    Given by: Lucious Groves CMA    Exp. Date: 11/11/2010    Lot #: [id number]    VIS given: 12/06/09 version given May 19, 2010.  Orders Added: 1)  Influenza Vaccine NON MCR [00028]

## 2010-07-14 ENCOUNTER — Encounter: Payer: Self-pay | Admitting: Internal Medicine

## 2010-07-14 ENCOUNTER — Ambulatory Visit (INDEPENDENT_AMBULATORY_CARE_PROVIDER_SITE_OTHER): Payer: No Typology Code available for payment source | Admitting: Internal Medicine

## 2010-07-14 DIAGNOSIS — R3129 Other microscopic hematuria: Secondary | ICD-10-CM | POA: Insufficient documentation

## 2010-07-14 DIAGNOSIS — R319 Hematuria, unspecified: Secondary | ICD-10-CM

## 2010-07-14 DIAGNOSIS — M545 Low back pain, unspecified: Secondary | ICD-10-CM

## 2010-07-14 DIAGNOSIS — R5383 Other fatigue: Secondary | ICD-10-CM

## 2010-07-14 DIAGNOSIS — M2550 Pain in unspecified joint: Secondary | ICD-10-CM

## 2010-07-14 DIAGNOSIS — R5381 Other malaise: Secondary | ICD-10-CM | POA: Insufficient documentation

## 2010-07-14 LAB — CONVERTED CEMR LAB
Bilirubin Urine: NEGATIVE
Ketones, urine, test strip: NEGATIVE
Urobilinogen, UA: 0.2

## 2010-07-16 LAB — CONVERTED CEMR LAB
Eosinophils Absolute: 0.1 10*3/uL (ref 0.0–0.7)
Lymphocytes Relative: 29 % (ref 12–46)
Lymphs Abs: 2.1 10*3/uL (ref 0.7–4.0)
MCV: 86.4 fL (ref 78.0–100.0)
Monocytes Relative: 8 % (ref 3–12)
Neutro Abs: 4.5 10*3/uL (ref 1.7–7.7)
Neutrophils Relative %: 62 % (ref 43–77)
RBC: 4.87 M/uL (ref 4.22–5.81)
TSH: 0.554 microintl units/mL (ref 0.350–4.500)
WBC: 7.2 10*3/uL (ref 4.0–10.5)

## 2010-07-20 NOTE — Assessment & Plan Note (Signed)
Summary: fatigue,bodyache/cbs   Vital Signs:  Patient profile:   60 year old male Weight:      179.6 pounds BMI:     25.68 Temp:     97.9 degrees F oral Pulse rate:   72 / minute Resp:     14 per minute BP sitting:   122 / 80  (left arm) Cuff size:   regular  Vitals Entered By: Shonna Chock CMA (July 14, 2010 1:22 PM) CC: Fatigue and bodyaches x 2  months-? Prostate infection , Back pain, URI symptoms   Primary Care Provider:  Marga Melnick, MD   CC:  Fatigue and bodyaches x 2  months-? Prostate infection , Back pain, and URI symptoms.  History of Present Illness:    Onset 8 weeks ago as ST followed by URI symptoms; he now has LBP, arthralgias(mainly  L hip & neck) & fatigue. He reports chills, but denies fever, loss of sensation, fecal incontinence, urinary incontinence, urinary retention, and dysuria.  The  aching pain is located in the mid low back.  & does not radiate  The pain has no trigger. The pain is made better by NSAID medications.  He  reports intermittent  fatigue especially with moderate exertion.  The patient also reports night sweats.  The patient denies weight loss, exertional chest pain, dyspnea, cough, and hemoptysis.  Other symptoms include daytime sleepiness.  The patient denies the following symptoms: leg swelling, orthopnea, PND, melena, adenopathy, severe snoring, hair , nail and skin changes.  He now denies purulent nasal discharge, sore throat, and earache.  The patient denies dyspnea and wheezing.  The patient denies headache.  The patient denies the following risk factors for Strep sinusitis: unilateral facial pain and tooth pain.  He believes this constellation recurs annually & is due to Prostatitis  Current Medications (verified): 1)  Quinapril Hcl 40 Mg  Tabs (Quinapril Hcl) .... Take 1 Tab Once Daily 2)  Aspirin 81 Mg Tabs (Aspirin) .... Take 1 Tablet By Mouth Once A Day 3)  Metamucil 30.9 % Powd (Psyllium) .... Two Teaspoons By Mouth in The Morning and  Two Teaspoons By Mouth At Bedtime 4)  Pepcid Ac Maximum Strength 20 Mg Tabs (Famotidine) .... One By Mouth As Needed  Allergies: 1)  ! Cardura  Physical Exam  General:  Appears tired but in no acute distress; alert,appropriate and cooperative throughout examination Eyes:  No corneal or conjunctival inflammation noted. No icterus Ears:  External ear exam shows no significant lesions or deformities.  Otoscopic examination reveals clear canals, tympanic membranes are intact bilaterally without bulging, retraction, inflammation or discharge. Hearing is grossly normal bilaterally. Nose:  External nasal examination shows no deformity or inflammation. Nasal mucosa are pink and moist without lesions or exudates. Mouth:  Oral mucosa and oropharynx without lesions or exudates.  Teeth in good repair. Mild pharyngeal erythema.   Neck:  No deformities, masses, or tenderness noted. Supple Lungs:  Normal respiratory effort, chest expands symmetrically. Lungs are clear to auscultation, no crackles or wheezes. Heart:  Normal rate and regular rhythm. S1 and S2 normal without gallop, murmur, click, rub . S4 with slurring Abdomen:  Bowel sounds positive,abdomen soft and non-tender without masses, organomegaly or hernias noted. Rectal:  No external abnormalities noted. Normal sphincter tone. No rectal masses or tenderness. Genitalia:  Testes bilaterally descended without nodularity, tenderness or masses. No scrotal masses or lesions. No penis lesions or urethral discharge. Vasectomy scar tissue L > R Prostate:  Prostate gland firm  and smooth,ULN  enlargement, w/o nodularity, tenderness, mass, asymmetry or induration. Msk:  No deformity or scoliosis noted of thoracic or lumbar spine.   NO flank tenderness Extremities:  No clubbing, cyanosis, edema, or deformity noted with normal full range of motion of all joints.  Neg SLR Neurologic:  alert & oriented X3, strength normal in all extremities, gait normal, and DTRs  symmetrical and normal.   Skin:  Intact without suspicious lesions or rashes. No jaundice Cervical Nodes:  No lymphadenopathy noted Axillary Nodes:  No palpable lymphadenopathy Inguinal Nodes:  No significant adenopathy Psych:  memory intact for recent and remote, normally interactive, and good eye contact.     Impression & Recommendations:  Problem # 1:  LOW BACK PAIN SYNDROME (ICD-724.2)  R/O Prostatitis  His updated medication list for this problem includes:    Aspirin 81 Mg Tabs (Aspirin) .Marland Kitchen... Take 1 tablet by mouth once a day  Orders: Venipuncture (91478) TLB-Sedimentation Rate (ESR) (85652-ESR)  Problem # 2:  FATIGUE (ICD-780.79)  Orders: Venipuncture (29562) TLB-CBC Platelet - w/Differential (85025-CBCD) TLB-TSH (Thyroid Stimulating Hormone) (84443-TSH)  Problem # 3:  ARTHRALGIA (ICD-719.40)  Orders: Venipuncture (13086) TLB-Sedimentation Rate (ESR) (85652-ESR)  Problem # 4:  MICROSCOPIC HEMATURIA (ICD-599.72)  His updated medication list for this problem includes:    Ciprofloxacin Hcl 500 Mg Tabs (Ciprofloxacin hcl) .Marland Kitchen... 1 two times a day  Complete Medication List: 1)  Quinapril Hcl 40 Mg Tabs (Quinapril hcl) .... Take 1 tab once daily 2)  Aspirin 81 Mg Tabs (Aspirin) .... Take 1 tablet by mouth once a day 3)  Metamucil 30.9 % Powd (Psyllium) .... Two teaspoons by mouth in the morning and two teaspoons by mouth at bedtime 4)  Pepcid Ac Maximum Strength 20 Mg Tabs (Famotidine) .... One by mouth as needed 5)  Ciprofloxacin Hcl 500 Mg Tabs (Ciprofloxacin hcl) .Marland Kitchen.. 1 two times a day  Other Orders: UA Dipstick w/o Micro (manual) (57846) Specimen Handling (99000) T-Culture, Urine ([id number])  Patient Instructions: 1)  Call if you change your mind about pain meds. 2)  Drink as much NON dairy  fluid as you can tolerate for the next few days.Spicey food & alcohol may aggravate your symptoms. Prescriptions: CIPROFLOXACIN HCL 500 MG TABS (CIPROFLOXACIN HCL) 1  two times a day  #20 x 0   Entered and Authorized by:   Marga Melnick MD   Signed by:   Marga Melnick MD on 07/14/2010   Method used:   Electronically to        [facility]  [phone]* (retail)       [address] [place], [place]  [address]       Ph: [phone] or [phone]       Fax: [phone]   RxID:   [id number]    Orders Added: 1)  UA Dipstick w/o Micro (manual) [81002] 2)  Specimen Handling [99000] 3)  T-Culture, Urine [[id number]] 4)  Est. Patient Level IV [42706] 5)  Venipuncture [23762] 6)  TLB-CBC Platelet - w/Differential [85025-CBCD] 7)  TLB-TSH (Thyroid Stimulating Hormone) [84443-TSH] 8)  TLB-Sedimentation Rate (ESR) [85652-ESR]    Laboratory Results   Urine Tests    Routine Urinalysis   Color: lt. yellow Appearance: Clear Glucose: negative   (Normal Range: Negative) Bilirubin: negative   (Normal Range: Negative) Ketone: negative   (Normal Range: Negative) Spec. Gravity: 1.020   (Normal Range: 1.003-1.035) Blood: trace-lysed   (Normal Range: Negative) pH: 6.0   (Normal Range: 5.0-8.0)  Protein: negative   (Normal Range: Negative) Urobilinogen: 0.2   (Normal Range: 0-1) Nitrite: negative   (Normal Range: Negative) Leukocyte Esterace: negative   (Normal Range: Negative)       Appended Document: fatigue,bodyache/cbs

## 2010-09-27 ENCOUNTER — Telehealth: Payer: Self-pay | Admitting: Internal Medicine

## 2010-09-27 NOTE — Telephone Encounter (Signed)
Lipid,Hep,BMP,CBCD,TSH,PSA, Stool Cards, Udip 272.4/401.9/601.0/v70.0 Copied from 09/28/09 phone note (requesting orders)

## 2010-09-27 NOTE — Telephone Encounter (Signed)
Patient has appt cpx [id number] - lab appt [phone] - need lab order

## 2010-09-28 NOTE — Telephone Encounter (Signed)
Lab order added to appt °

## 2010-10-19 ENCOUNTER — Other Ambulatory Visit: Payer: Self-pay | Admitting: Internal Medicine

## 2010-10-19 MED ORDER — QUINAPRIL HCL 40 MG PO TABS: 40.0000 mg | ORAL_TABLET | Freq: Every day | ORAL | Status: DC

## 2010-10-19 NOTE — Telephone Encounter (Signed)
Rx sent to pharmacy   

## 2010-12-27 ENCOUNTER — Other Ambulatory Visit: Payer: Self-pay | Admitting: Internal Medicine

## 2010-12-27 DIAGNOSIS — Z0000 Encounter for general adult medical examination without abnormal findings: Secondary | ICD-10-CM

## 2010-12-27 DIAGNOSIS — I10 Essential (primary) hypertension: Secondary | ICD-10-CM

## 2010-12-27 DIAGNOSIS — E785 Hyperlipidemia, unspecified: Secondary | ICD-10-CM

## 2010-12-27 DIAGNOSIS — N410 Acute prostatitis: Secondary | ICD-10-CM

## 2010-12-28 ENCOUNTER — Other Ambulatory Visit (INDEPENDENT_AMBULATORY_CARE_PROVIDER_SITE_OTHER): Payer: No Typology Code available for payment source

## 2010-12-28 DIAGNOSIS — E785 Hyperlipidemia, unspecified: Secondary | ICD-10-CM

## 2010-12-28 DIAGNOSIS — I10 Essential (primary) hypertension: Secondary | ICD-10-CM

## 2010-12-28 DIAGNOSIS — Z0000 Encounter for general adult medical examination without abnormal findings: Secondary | ICD-10-CM

## 2010-12-28 DIAGNOSIS — N410 Acute prostatitis: Secondary | ICD-10-CM

## 2010-12-28 LAB — CBC WITH DIFFERENTIAL/PLATELET
Basophils Absolute: 0 10*3/uL (ref 0.0–0.1)
Basophils Relative: 0.6 % (ref 0.0–3.0)
HCT: 43.9 % (ref 39.0–52.0)
Hemoglobin: 14.9 g/dL (ref 13.0–17.0)
Lymphocytes Relative: 28.8 % (ref 12.0–46.0)
Lymphs Abs: 1.7 10*3/uL (ref 0.7–4.0)
Monocytes Relative: 8.3 % (ref 3.0–12.0)
Neutro Abs: 3.7 10*3/uL (ref 1.4–7.7)
RBC: 4.79 Mil/uL (ref 4.22–5.81)
RDW: 12.5 % (ref 11.5–14.6)

## 2010-12-28 LAB — HEPATIC FUNCTION PANEL
Albumin: 3.9 g/dL (ref 3.5–5.2)
Total Bilirubin: 1.3 mg/dL — ABNORMAL HIGH (ref 0.3–1.2)

## 2010-12-28 LAB — POCT URINALYSIS DIPSTICK
Bilirubin, UA: NEGATIVE
Glucose, UA: NEGATIVE
Ketones, UA: NEGATIVE
Leukocytes, UA: NEGATIVE
Protein, UA: NEGATIVE
Spec Grav, UA: 1.02

## 2010-12-28 LAB — BASIC METABOLIC PANEL
CO2: 28 mEq/L (ref 19–32)
GFR: 83.58 mL/min (ref >60.00)
Glucose, Bld: 106 mg/dL — ABNORMAL HIGH (ref 70–99)
Potassium: 4.3 mEq/L (ref 3.5–5.1)
Sodium: 136 mEq/L (ref 135–145)

## 2010-12-28 LAB — LIPID PANEL
HDL: 55.9 mg/dL (ref >39.00)
Total CHOL/HDL Ratio: 3
Triglycerides: 57 mg/dL (ref 0.0–149.0)
VLDL: 11.4 mg/dL (ref 0.0–40.0)

## 2010-12-28 NOTE — Progress Notes (Signed)
Labs only

## 2011-01-04 ENCOUNTER — Encounter: Payer: Self-pay | Admitting: Internal Medicine

## 2011-01-04 ENCOUNTER — Other Ambulatory Visit (INDEPENDENT_AMBULATORY_CARE_PROVIDER_SITE_OTHER): Payer: No Typology Code available for payment source

## 2011-01-04 ENCOUNTER — Ambulatory Visit (INDEPENDENT_AMBULATORY_CARE_PROVIDER_SITE_OTHER): Payer: No Typology Code available for payment source | Admitting: Internal Medicine

## 2011-01-04 DIAGNOSIS — Z860100 Personal history of colon polyps, unspecified: Secondary | ICD-10-CM

## 2011-01-04 DIAGNOSIS — I10 Essential (primary) hypertension: Secondary | ICD-10-CM

## 2011-01-04 DIAGNOSIS — E785 Hyperlipidemia, unspecified: Secondary | ICD-10-CM

## 2011-01-04 DIAGNOSIS — Z0000 Encounter for general adult medical examination without abnormal findings: Secondary | ICD-10-CM

## 2011-01-04 DIAGNOSIS — Z1211 Encounter for screening for malignant neoplasm of colon: Secondary | ICD-10-CM

## 2011-01-04 DIAGNOSIS — Z23 Encounter for immunization: Secondary | ICD-10-CM

## 2011-01-04 LAB — HEMOCCULT GUIAC POC 1CARD (OFFICE)
Card #2 Fecal Occult Blod, POC: NEGATIVE
Card #3 Fecal Occult Blood, POC: NEGATIVE
Fecal Occult Blood, POC: NEGATIVE

## 2011-01-04 MED ORDER — TETANUS-DIPHTH-ACELL PERTUSSIS 5-2.5-18.5 LF-MCG/0.5 IM SUSP
0.5000 mL | Freq: Once | INTRAMUSCULAR | Status: AC
  Administered 2011-01-04: 0.5 mL via INTRAMUSCULAR

## 2011-01-04 MED ORDER — QUINAPRIL HCL 40 MG PO TABS: 40.0000 mg | ORAL_TABLET | Freq: Every day | ORAL | Status: DC

## 2011-01-04 NOTE — Progress Notes (Signed)
Labs only

## 2011-01-04 NOTE — Patient Instructions (Addendum)
Preventive Health Care: Exercise at least 30-45 minutes a day,  3-4 days a week.  Eat a low-fat diet with lots of fruits and vegetables, up to 7-9 servings per day. Consume less than 40 grams of sugar per day from foods & drinks with High Fructose Corn Sugar as #1,2,3 or # 4 on label. Follow the low carb nutrition program in [employer] as closely as possible to prevent Diabetes progression & complications. White carbohydrates (potatoes, rice, bread, and pasta) have a high spike of sugar and a high load of sugar. For example a  baked potato has a cup of sugar and a  french fry  2 teaspoons of sugar. Yams, wild  rice, whole grained bread &  wheat pasta have been much lower spike and load of  sugar. Portions should be the size of a deck of cards or your palm.  Please  schedule fasting Labs in 4 months : Lipids,A1c ( 272.4, 790.29).

## 2011-01-04 NOTE — Progress Notes (Signed)
Subjective:    Patient ID: Nicholas Paul, male    DOB: Nov 18, 1950, 60 y.o.   MRN: [id number]  HPI  Nicholas Paul  is here for a physical; he has no acute issues.      Review of Systems Patient reports no vision changes,anorexia, weight change, fever ,adenopathy, persistant / recurrent hoarseness, swallowing issues, chest pain,palpitations, edema,persistant / recurrent cough, hemoptysis, dyspnea(rest, exertional, paroxysmal nocturnal), gastrointestinal  bleeding (melena, rectal bleeding), abdominal pain, excessive heart burn, GU symptoms( dysuria, hematuria, pyuria, voiding/incontinence  issues) syncope, focal weakness, memory loss,numbness & tingling, skin/hair/nail changes,depression, anxiety, abnormal bruising/bleeding,or  musculoskeletal symptoms/signs.  He has had chronic hearing loss.     Objective:   Physical Exam Gen.: Healthy and well-nourished in appearance. Alert, appropriate and cooperative throughout exam. Head: Normocephalic without obvious abnormalities Eyes: No corneal or conjunctival inflammation noted. Pupils equal round reactive to light and accommodation. Fundal exam is benign without hemorrhages, exudate, papilledema. Extraocular motion intact. Vision grossly normal with lenses. Ears: External  ear exam reveals no significant lesions or deformities. Canals clear .TMs normal. Hearing is grossly decreased  normal bilaterally. Nose: External nasal exam reveals no deformity or inflammation. Nasal mucosa are pink and moist. No lesions or exudates noted.   Mouth: Oral mucosa and oropharynx reveal no lesions or exudates. Teeth in good repair. Neck: No deformities, masses, or tenderness noted. Range of motion &. Thyroid normal. Lungs: Normal respiratory effort; chest expands symmetrically. Lungs are clear to auscultation without rales, wheezes, or increased work of breathing. Heart: Normal rate and rhythm. Normal S1 and S2. No gallop, click, or rub. S4 w/o  murmur. Abdomen: Bowel  sounds normal; abdomen soft and nontender. No masses, organomegaly or hernias noted. Genitalia/DRE: done in April 2012.                                                                                   Musculoskeletal/extremities: No deformity or scoliosis noted of  the thoracic or lumbar spine. No clubbing, cyanosis, edema, or deformity noted. Range of motion  normal .Tone & strength  normal.Joints normal. Nail health  good. Vascular: Carotid, radial artery, dorsalis pedis and  posterior tibial pulses are full and equal. No bruits present. Neurologic: Alert and oriented x3. Deep tendon reflexes symmetrical and normal.          Skin: Intact without suspicious lesions or rashes. Lymph: No cervical, axillary  lymphadenopathy present. Psych: Mood and affect are normal. Normally interactive                                                                                         Assessment & Plan:  #1 comprehensive physical exam; no acute findings #2 see Problem List with Assessments & Recommendations  #3 EKG suggests slight decrease in T voltage diffusely. He is asymptomatic exercising and a high level.  He states he gets his heart rate up to 150+, without symptoms  Plan: see Orders

## 2011-04-30 ENCOUNTER — Encounter: Payer: Self-pay | Admitting: Internal Medicine

## 2011-04-30 ENCOUNTER — Ambulatory Visit (INDEPENDENT_AMBULATORY_CARE_PROVIDER_SITE_OTHER): Payer: No Typology Code available for payment source | Admitting: Internal Medicine

## 2011-04-30 VITALS — BP 136/90 | HR 72 | Temp 98.1°F | Wt 178.0 lb

## 2011-04-30 DIAGNOSIS — M545 Low back pain, unspecified: Secondary | ICD-10-CM

## 2011-04-30 DIAGNOSIS — N390 Urinary tract infection, site not specified: Secondary | ICD-10-CM

## 2011-04-30 DIAGNOSIS — I10 Essential (primary) hypertension: Secondary | ICD-10-CM

## 2011-04-30 DIAGNOSIS — N410 Acute prostatitis: Secondary | ICD-10-CM

## 2011-04-30 LAB — POCT URINALYSIS DIPSTICK
Bilirubin, UA: NEGATIVE
Ketones, UA: NEGATIVE
Protein, UA: NEGATIVE
Spec Grav, UA: 1.01
pH, UA: 6

## 2011-04-30 MED ORDER — CIPROFLOXACIN HCL 500 MG PO TABS: 500.0000 mg | ORAL_TABLET | Freq: Two times a day (BID) | ORAL | Status: DC

## 2011-04-30 MED ORDER — AMLODIPINE BESYLATE 5 MG PO TABS: 5.0000 mg | ORAL_TABLET | Freq: Every day | ORAL | Status: DC

## 2011-04-30 NOTE — Progress Notes (Signed)
Subjective:    Patient ID: Nicholas Paul, male    DOB: 1950/11/01, 60 y.o.   MRN: [id number]  HPI BACK PAIN: Location: LS spine  Quality: deep aching  nset: 3 weeks ago after getting constipation Worse with: no exacerbating factors  Better with: no Rx  Radiation: pressure in testicles Trauma: no Red Flags Fecal/urinary incontinence: no  Numbness/Weakness: no  Fever/chills/sweats: no  Unexplained weight loss: no  No relief with bedrest: no, it is better overnight but recurs with mobilization  h/o cancer/immunosuppression: no  PMH of osteoporosis or chronic steroid use: no , but similar symptoms with Prostatitis  HYPERTENSION:elevation recently Disease Monitoring  Blood pressure range: 141/90- 148 /95  Chest pain: no   Dyspnea: no   Claudication: no   Medication compliance: yes   Lightheadedness: no   Urinary frequency: no   Edema: no     Preventitive Healthcare:  Exercise: yes, gym 1-2X / week   Diet Pattern: no plan  Salt Restriction: yes, no added salt        Review of Systems  He's noted some pressure, but he denies hematuria, dysuria, or pyuria. He does describe decreased libido. He has not had significant muscle weakness. He is inquiring about screening for testosterone deficiency     Objective:   Physical Exam Gen.: Healthy and well-nourished in appearance. Alert, appropriate and cooperative throughout exam.   Eyes: No corneal or conjunctival inflammation noted.  Neck: No deformities, masses, or tenderness noted. Range of motion normal.  Lungs: Normal respiratory effort; chest expands symmetrically. Lungs are clear to auscultation without rales, wheezes, or increased work of breathing. Heart: Normal rate and rhythm. Normal S1 and S2. No gallop, click, or rub. S 4 w/o  murmur. Abdomen: Bowel sounds normal; abdomen soft and nontender. No masses, organomegaly or hernias noted. Genitalia/DRE:  varices are present in the left  scrotum   ; prostate is  enlarged and firm and tender to palpation. No definite nodularity is present.                                                                        Musculoskeletal/extremities: No deformity or scoliosis noted of  the thoracic or lumbar spine. No clubbing, cyanosis, edema, or deformity noted. Range of motion  normal .Tone & strength  normal.Joints normal. Nail health  good. He is able to lie flat and sit up without help. Straight leg raising is negative to 90 Vascular: Carotid, radial artery, dorsalis pedis and  posterior tibial pulses are full and equal. No bruits present. Neurologic: Alert and oriented x3. Deep tendon reflexes symmetrical and normal. Gait, heel and toe walking, her normal period          Skin: Intact without suspicious lesions or rashes. Lymph: No cervical, axillary, or inguinal lymphadenopathy present. Psych: Mood and affect are normal. Normally interactive  Assessment & Plan: #  #1   #1 clinical prostatitis; ciprofloxacin will be prescribed. Forced oral hydration is recommended. Alcohol and spicy foods should be avoided.  #2 decreased libido; rule out testosterone deficiency. Fasting labs will be ordered.

## 2011-04-30 NOTE — Assessment & Plan Note (Signed)
He believes the recent elevation of blood pressure is due to the possible prostatitis. This has been a recurrent pattern the past. If blood pressure fails to drop  after any prostatitis is treated; a calcium channel blocker will be added. Goals and risks discussed.

## 2011-04-30 NOTE — Patient Instructions (Addendum)
Blood Pressure Goal  Ideally is an AVERAGE < 135/85. This AVERAGE should be calculated from @ least 5-7 BP readings taken @ different times of day on different days of week. You should not respond to isolated BP readings , but rather the AVERAGE for that week . Amlodipine 5 mg will be added to ACE-I  if  blood pressure fails to return to normal limits. Please  schedule fasting Labs : testosterone. PLEASE BRING THESE INSTRUCTIONS TO FOLLOW UP  LAB APPOINTMENT.This will guarantee correct labs are drawn, eliminating need for repeat blood sampling ( needle sticks ! ). Diagnoses /Codes: decreased libido

## 2011-05-02 ENCOUNTER — Telehealth: Payer: Self-pay

## 2011-05-02 ENCOUNTER — Observation Stay (HOSPITAL_COMMUNITY)
Admission: EM | Admit: 2011-05-02 | Discharge: 2011-05-03 | Disposition: A | Discharge status: Home / Self Care | Payer: No Typology Code available for payment source | Attending: Emergency Medicine | Admitting: Emergency Medicine

## 2011-05-02 ENCOUNTER — Emergency Department (HOSPITAL_COMMUNITY): Payer: No Typology Code available for payment source

## 2011-05-02 ENCOUNTER — Encounter (HOSPITAL_COMMUNITY): Payer: Self-pay | Admitting: *Deleted

## 2011-05-02 ENCOUNTER — Emergency Department (INDEPENDENT_AMBULATORY_CARE_PROVIDER_SITE_OTHER)
Admission: EM | Admit: 2011-05-02 | Discharge: 2011-05-02 | Disposition: A | Discharge status: Nursing Home (Includes ICF) | Payer: No Typology Code available for payment source | Source: Home / Self Care | Attending: Family Medicine | Admitting: Family Medicine

## 2011-05-02 ENCOUNTER — Other Ambulatory Visit: Payer: Self-pay

## 2011-05-02 DIAGNOSIS — K5900 Constipation, unspecified: Secondary | ICD-10-CM | POA: Insufficient documentation

## 2011-05-02 DIAGNOSIS — R0789 Other chest pain: Secondary | ICD-10-CM

## 2011-05-02 DIAGNOSIS — E785 Hyperlipidemia, unspecified: Secondary | ICD-10-CM | POA: Insufficient documentation

## 2011-05-02 DIAGNOSIS — I10 Essential (primary) hypertension: Secondary | ICD-10-CM | POA: Insufficient documentation

## 2011-05-02 DIAGNOSIS — I2510 Atherosclerotic heart disease of native coronary artery without angina pectoris: Secondary | ICD-10-CM

## 2011-05-02 DIAGNOSIS — R109 Unspecified abdominal pain: Secondary | ICD-10-CM | POA: Insufficient documentation

## 2011-05-02 DIAGNOSIS — R079 Chest pain, unspecified: Principal | ICD-10-CM | POA: Insufficient documentation

## 2011-05-02 HISTORY — DX: Essential (primary) hypertension: I10

## 2011-05-02 LAB — CBC
HCT: 41.2 % (ref 39.0–52.0)
Hemoglobin: 14.9 g/dL (ref 13.0–17.0)
RBC: 4.81 MIL/uL (ref 4.22–5.81)
WBC: 7.1 10*3/uL (ref 4.0–10.5)

## 2011-05-02 LAB — POCT I-STAT, CHEM 8
BUN: 12 mg/dL (ref 6–23)
Chloride: 104 mEq/L (ref 96–112)
Creatinine, Ser: 1.2 mg/dL (ref 0.50–1.35)
Hemoglobin: 14.6 g/dL (ref 13.0–17.0)
Potassium: 4 mEq/L (ref 3.5–5.1)
Sodium: 141 mEq/L (ref 135–145)

## 2011-05-02 LAB — POCT I-STAT TROPONIN I: Troponin i, poc: 0 ng/mL (ref 0.00–0.08)

## 2011-05-02 MED ORDER — ASPIRIN 81 MG PO CHEW
324.0000 mg | CHEWABLE_TABLET | Freq: Once | ORAL | Status: AC
  Administered 2011-05-02: 324 mg via ORAL

## 2011-05-02 MED ORDER — DEXTROSE 5 % IV SOLN: INTRAVENOUS | Status: DC

## 2011-05-02 MED ORDER — SODIUM CHLORIDE 0.9 % IV SOLN
INTRAVENOUS | Status: DC
  Administered 2011-05-02: 15:00:00 via INTRAVENOUS

## 2011-05-02 MED ORDER — ASPIRIN 81 MG PO CHEW
CHEWABLE_TABLET | ORAL | Status: AC
  Filled 2011-05-02: qty 4

## 2011-05-02 NOTE — ED Notes (Signed)
EKG done on arrival to the treatment room

## 2011-05-02 NOTE — ED Notes (Signed)
Pt states he was sitting in his office this am and started having chest tightness.  Denies any radiation, nausea or sob.  States he just didn't feel right.  Saw Dr. Alwyn Ren on Monday and was put on Cipro for Prostatitis.  States he checked his BP at work and it was high.  Was given a rx for Norvasc but was told not to start it until he took the Cipro and see if his BP continued to be elevated.  Pt's color good, skin warm and dry.

## 2011-05-02 NOTE — ED Provider Notes (Signed)
9:27 PM Patient is in CDU under observation, chest pain protocol.  This is a shared visit with Dr Denton Lank and Jaci Carrel, PA-C.  Patient is chest pain free at present.  Plan is for coronary CT in the morning.  Patient has no needs at this time. On exam, pt is A&Ox4, NAD, RRR, no m/r/g, CTAB, abd soft, NT, extremities without edema, distal pulses intact and equal bilaterally.    12:14 AM Patient is on chest pain protocol overnight with coronary CT planned for morning.  Dr. Roselyn Bering assumes care of patient overnight.    Dillard Cannon [place], [place] 05/03/11 [phone]

## 2011-05-02 NOTE — ED Notes (Signed)
CDU unable to take patient at the time. Nurse to call back.

## 2011-05-02 NOTE — ED Notes (Signed)
[facility] here to transport pt 

## 2011-05-02 NOTE — ED Provider Notes (Signed)
History     CSN: [id number] Arrival date & time: 05/02/2011  2:18 PM   First MD Initiated Contact with Patient 05/02/11 1425      Chief Complaint  Patient presents with  . Chest Pain    (Consider location/radiation/quality/duration/timing/severity/associated sxs/prior treatment) Patient is a 60 y.o. male presenting with chest pain. The history is provided by the patient.  Chest Pain The chest pain began 6 - 12 hours ago. Chest pain occurs constantly. The chest pain is unchanged. The severity of the pain is mild. The quality of the pain is described as tightness. The pain does not radiate. Chest pain is worsened by deep breathing. Primary symptoms comment: chest tightness -left sided. Associated symptoms comments: H/o elevated ldl chol, high bp recently elevated.Marland Kitchen He tried nothing for the symptoms.     Past Medical History  Diagnosis Date  . Hyperlipidemia   . Hematuria, microscopic   . Hypertension     Past Surgical History  Procedure Date  . Vasectomy   . Cystoscopy     X4 , Dr Vonita Moss  . Tonsillectomy and adenoidectomy   . Colonoscopy w/ polypectomy 2006    ? hyperplastic    Family History  Problem Relation Age of Onset  . Cancer Father     bladder cancer  . Cancer Brother     bladder cancer  . Hypertension Brother   . Cancer Paternal Aunt     bladder cancer  . Hyperlipidemia Mother   . Hypertension Mother     History  Substance Use Topics  . Smoking status: Never Smoker   . Smokeless tobacco: Not on file  . Alcohol Use: 7.2 oz/week    5 Glasses of wine, 7 Cans of beer per week      Review of Systems  Constitutional: Negative.   HENT: Negative.   Respiratory: Negative.   Cardiovascular: Positive for chest pain.  Gastrointestinal: Negative.     Allergies  Doxazosin mesylate  Home Medications   Current Outpatient Rx  Name Route Sig Dispense Refill  . ASPIRIN 81 MG PO TABS Oral Take 81 mg by mouth daily.      Marland Kitchen CIPROFLOXACIN HCL 500 MG PO  TABS Oral Take 1 tablet (500 mg total) by mouth 2 (two) times daily. 20 tablet 0  . FAMOTIDINE 10 MG PO CHEW Oral Chew 10 mg by mouth as needed.      . OMEGA 3 PO Oral Take by mouth daily.      . PSYLLIUM 58.6 % PO PACK Oral Take 1 packet by mouth daily.      Marland Kitchen AMLODIPINE BESYLATE 5 MG PO TABS Oral Take 1 tablet (5 mg total) by mouth daily. 30 tablet 11    Fill this if blood pressure fails to return to nor ...  . QUINAPRIL HCL 40 MG PO TABS Oral Take 1 tablet (40 mg total) by mouth at bedtime. 90 tablet 3    BP 159/91  Pulse 76  Temp(Src) 98.4 F (36.9 C) (Oral)  Resp 18  SpO2 97%  Physical Exam  Nursing note and vitals reviewed. Constitutional: He is oriented to person, place, and time. He appears well-developed and well-nourished.  HENT:  Head: Normocephalic.  Mouth/Throat: Oropharynx is clear and moist.  Neck: Normal range of motion.  Cardiovascular: Normal rate, regular rhythm, normal heart sounds and intact distal pulses.   Pulmonary/Chest: Effort normal and breath sounds normal.  Abdominal: Soft. Bowel sounds are normal. He exhibits no distension. There is no tenderness.  There is no guarding.  Musculoskeletal: He exhibits no edema.  Neurological: He is alert and oriented to person, place, and time.  Skin: Skin is warm and dry.    ED Course  Procedures (including critical care time)  Labs Reviewed - No data to display No results found.   1. Chest pain, atypical       MDM  ecg-- wnl.        Barkley Bruns, MD 05/02/11 [phone]

## 2011-05-02 NOTE — ED Provider Notes (Signed)
History     CSN: [id number] Arrival date & time: 05/02/2011  3:45 PM   First MD Initiated Contact with Patient 05/02/11 1613      Chief Complaint  Patient presents with  . Chest Pain    (Consider location/radiation/quality/duration/timing/severity/associated sxs/prior treatment) HPI Comments: Patient states he's been having constant chest pressure since earlier this afternoon.  The pressure was relieved by one sublingual nitroglycerin and 4 baby aspirin given to him by Bryson Ha on transfer from an urgent care clinic.  Patient denies having a cardiac history, workup, or cardiologist.  He states he does not smoke cigarettes and does not have any comorbidities.   Patient is a 60 y.o. male presenting with chest pain. The history is provided by the patient.  Chest Pain The chest pain began 6 - 12 hours ago. Chest pain occurs constantly. The chest pain is resolved. At its most intense, the pain is at 4/10. The severity of the pain is mild. The quality of the pain is described as pressure-like. The pain does not radiate. Primary symptoms include abdominal pain (pt states he has been constipated d/t his prostatitis ). Pertinent negatives for primary symptoms include no fever, no fatigue, no syncope, no shortness of breath, no cough, no palpitations, no nausea, no vomiting, no dizziness and no altered mental status.  Pertinent negatives for associated symptoms include no claudication, no diaphoresis, no lower extremity edema, no near-syncope, no numbness, no orthopnea, no paroxysmal nocturnal dyspnea and no weakness. He tried aspirin and nitroglycerin for the symptoms. Risk factors include male gender.  His past medical history is significant for hyperlipidemia and hypertension.  Procedure history is negative for cardiac catheterization, echocardiogram, persantine thallium, stress echo, stress thallium and exercise treadmill test.     Past Medical History  Diagnosis Date  . Hyperlipidemia   .  Hematuria, microscopic   . Hypertension     Past Surgical History  Procedure Date  . Vasectomy   . Cystoscopy     X4 , Dr Vonita Moss  . Tonsillectomy and adenoidectomy   . Colonoscopy w/ polypectomy 2006    ? hyperplastic    Family History  Problem Relation Age of Onset  . Cancer Father     bladder cancer  . Cancer Brother     bladder cancer  . Hypertension Brother   . Cancer Paternal Aunt     bladder cancer  . Hyperlipidemia Mother   . Hypertension Mother     History  Substance Use Topics  . Smoking status: Never Smoker   . Smokeless tobacco: Not on file  . Alcohol Use: 7.2 oz/week    5 Glasses of wine, 7 Cans of beer per week      Review of Systems  Constitutional: Negative for fever, diaphoresis and fatigue.  HENT: Negative for ear pain, congestion, sore throat, rhinorrhea, neck pain and voice change.   Eyes: Negative for photophobia and visual disturbance.  Respiratory: Negative for cough, choking, chest tightness, shortness of breath and stridor.   Cardiovascular: Positive for chest pain (Pressure more then pain). Negative for palpitations, orthopnea, claudication, leg swelling, syncope and near-syncope.  Gastrointestinal: Positive for abdominal pain (pt states he has been constipated d/t his prostatitis ) and constipation. Negative for nausea and vomiting.  Musculoskeletal: Negative for gait problem.  Skin: Negative for pallor.  Neurological: Negative for dizziness, syncope, facial asymmetry, speech difficulty, weakness, light-headedness, numbness and headaches.  Psychiatric/Behavioral: Negative for altered mental status.  All other systems reviewed and are negative.  Allergies  Doxazosin mesylate  Home Medications   Current Outpatient Rx  Name Route Sig Dispense Refill  . AMLODIPINE BESYLATE 5 MG PO TABS Oral Take 5 mg by mouth daily.      . ASPIRIN 81 MG PO TABS Oral Take 81 mg by mouth daily.      Marland Kitchen CIPROFLOXACIN HCL 500 MG PO TABS Oral Take 500  mg by mouth 2 (two) times daily.      Marland Kitchen FAMOTIDINE 10 MG PO CHEW Oral Chew 10 mg by mouth as needed. For heartburn.    . OMEGA-3-ACID ETHYL ESTERS 1 G PO CAPS Oral Take 1 g by mouth daily.      . PSYLLIUM 58.6 % PO PACK Oral Take 1 packet by mouth daily.      . QUINAPRIL HCL 40 MG PO TABS Oral Take 40 mg by mouth at bedtime.        BP 128/72  Pulse 56  Temp(Src) 98.1 F (36.7 C) (Oral)  Resp 13  Ht 5\' 9"  (1.753 m)  Wt 178 lb (80.74 kg)  BMI 26.29 kg/m2  SpO2 99%  Physical Exam  Nursing note and vitals reviewed. Constitutional: He is oriented to person, place, and time. He appears well-developed and well-nourished. No distress.  HENT:  Head: Normocephalic and atraumatic.  Eyes: Conjunctivae and EOM are normal.       Normal appearance  Neck: Normal range of motion. Neck supple. No JVD present. Carotid bruit is not present.  Cardiovascular: Normal rate, regular rhythm, normal heart sounds and intact distal pulses.   Pulmonary/Chest: Effort normal and breath sounds normal. No respiratory distress. He exhibits no tenderness.  Abdominal: Soft. Bowel sounds are normal. He exhibits no distension. There is tenderness (mild). There is no guarding.  Musculoskeletal: He exhibits no edema and no tenderness.       2+ Dorsalis Pedis pulses  Neurological: He is alert and oriented to person, place, and time.  Skin: Skin is warm and dry. No rash noted. He is not diaphoretic.  Psychiatric: He has a normal mood and affect. His behavior is normal.    ED Course  Procedures (including critical care time)   Date: 05/02/2011  Rate: 57  Rhythm: normal sinus rhythm  QRS Axis: normal  Intervals: normal  ST/T Wave abnormalities: normal  Conduction Disutrbances:none  Narrative Interpretation:   Old EKG Reviewed: unchanged    Labs Reviewed  I-STAT TROPONIN I  I-STAT TROPONIN I  I-STAT TROPONIN I  I-STAT, CHEM 8  CBC   No results found.   No diagnosis found.  Pt H&P, lab results  discussed with Dr. Denton Lank who agrees with my plan to transfer to the CDU for further cardiac wk up on the CP pain protocol   MDM  Chest Pressure        [place], [place] 05/02/11 1831

## 2011-05-02 NOTE — ED Notes (Signed)
C/o SSCP onset while sitting at deck. Pt from [facility] via [facility]. Given NTG SL x1 enroute with relief of CP.  Given ASA 324mg  at [facility].

## 2011-05-02 NOTE — ED Notes (Signed)
[facility] notified of need for transport to the ED. 

## 2011-05-02 NOTE — Telephone Encounter (Signed)
Patient called c/o chest tightness (onset this am) and elevated B/P 164/99. Patient states " I just feel weird" , patient was informed to seek medical attention at Urgent Care or Emergency Room. Patient denied blurred vision, dizziness or faint feeling. Patient agreed to being seen at Urgent Care

## 2011-05-03 ENCOUNTER — Emergency Department (HOSPITAL_COMMUNITY): Payer: No Typology Code available for payment source

## 2011-05-03 ENCOUNTER — Other Ambulatory Visit: Payer: Self-pay | Admitting: Internal Medicine

## 2011-05-03 DIAGNOSIS — E290 Testicular hyperfunction: Secondary | ICD-10-CM

## 2011-05-03 MED ORDER — NITROGLYCERIN 0.4 MG SL SUBL
0.4000 mg | SUBLINGUAL_TABLET | Freq: Once | SUBLINGUAL | Status: AC
  Administered 2011-05-03: 0.4 mg via SUBLINGUAL

## 2011-05-03 MED ORDER — METOPROLOL TARTRATE 1 MG/ML IV SOLN
5.0000 mg | Freq: Once | INTRAVENOUS | Status: AC
  Administered 2011-05-03: 5 mg via INTRAVENOUS

## 2011-05-03 MED ORDER — METOPROLOL TARTRATE 25 MG PO TABS
50.0000 mg | ORAL_TABLET | Freq: Once | ORAL | Status: AC
  Administered 2011-05-03: 50 mg via ORAL
  Filled 2011-05-03: qty 2

## 2011-05-03 MED ORDER — METOPROLOL TARTRATE 25 MG PO TABS: 100.0000 mg | ORAL_TABLET | Freq: Once | ORAL | Status: DC

## 2011-05-03 MED ORDER — NITROGLYCERIN 0.4 MG SL SUBL
SUBLINGUAL_TABLET | SUBLINGUAL | Status: AC
  Administered 2011-05-03: 0.4 mg via SUBLINGUAL
  Filled 2011-05-03: qty 25

## 2011-05-03 MED ORDER — IOHEXOL 350 MG/ML SOLN
80.0000 mL | Freq: Once | INTRAVENOUS | Status: AC | PRN
  Administered 2011-05-03: 80 mL via INTRAVENOUS

## 2011-05-03 MED ORDER — METOPROLOL TARTRATE 1 MG/ML IV SOLN
INTRAVENOUS | Status: AC
  Administered 2011-05-03: 5 mg via INTRAVENOUS
  Filled 2011-05-03: qty 10

## 2011-05-03 NOTE — ED Notes (Signed)
Pa has reviewed results with pt. Discharge pending

## 2011-05-03 NOTE — ED Provider Notes (Signed)
8:00 AM Patient is in the CDU under the chest pain protocol. He experienced chest tightness yesterday that began while getting ready for work and was constant until he received nitroglycerin at urgent care. Since that time there is been no chest pain. He denies any complaints currently. Alert and oriented x3. Lungs CTAB. Heart RRR without murmur. Abdomen soft nontender nondistended. Distal pulses 2+.   9:51 AM Coronary CT with evidence of 50% eccentric soft plaque stenosis of right coronary artery with otherwise patent vessels and a calcium score of 0. There is also a 4 mm pulmonary nodule noted.  I spoke with Dr. Patty Sermons regarding this patient's history and CT results and he feels that an outpatient followup is appropriate. I have discussed the CT results with the patient regarding both the coronary artery disease and the pulmonary nodule. He voices understanding of the plan to followup with Dr. Patty Sermons and will also speak with his primary doctor today regarding his visit to the emergency department. The patient is a nonsmoker and does not appear to have risk factors for bronchogenic carcinoma; I have advised him that the recommendation without risk factors is that there is no followup necessary. However, I have also advised him that the recommendation when there are risk factors is a repeat scan in one year and that he should discuss this with his doctor to ensure that he is following the appropriate recommendation. He will be discharged home.  Elwyn Reach [place], [place] 05/03/11 1004

## 2011-05-03 NOTE — ED Notes (Signed)
Pt education on ct study provided. Questions answered. Agreeable to study

## 2011-05-03 NOTE — ED Notes (Signed)
Patient transported to CT. accompained by rn

## 2011-05-04 ENCOUNTER — Other Ambulatory Visit: Payer: No Typology Code available for payment source

## 2011-05-04 DIAGNOSIS — E290 Testicular hyperfunction: Secondary | ICD-10-CM

## 2011-05-07 LAB — TESTOSTERONE, FREE, TOTAL, SHBG
Sex Hormone Binding: 27 nmol/L (ref 13–71)
Testosterone: 447.14 ng/dL (ref 250–890)

## 2011-05-09 NOTE — ED Provider Notes (Signed)
Medical screening examination/treatment/procedure(s) were conducted as a shared visit with non-physician practitioner(s) and myself.  I personally evaluated the patient during the encounter   Suzi Roots, MD 05/09/11 [phone]

## 2011-05-09 NOTE — ED Provider Notes (Signed)
Medical screening examination/treatment/procedure(s) were conducted as a shared visit with non-physician practitioner(s) and myself.  I personally evaluated the patient during the encounter   Suzi Roots, MD 05/09/11 [phone]

## 2011-05-09 NOTE — ED Provider Notes (Signed)
Medical screening examination/treatment/procedure(s) were conducted as a shared visit with non-physician practitioner(s) and myself.  I personally evaluated the patient during the encounter   Nicholas Paul E Ariyonna Twichell, MD 05/09/11 0752 

## 2011-05-21 ENCOUNTER — Ambulatory Visit (INDEPENDENT_AMBULATORY_CARE_PROVIDER_SITE_OTHER): Payer: No Typology Code available for payment source | Admitting: Nurse Practitioner

## 2011-05-21 ENCOUNTER — Other Ambulatory Visit: Payer: Self-pay | Admitting: *Deleted

## 2011-05-21 ENCOUNTER — Encounter: Payer: Self-pay | Admitting: Nurse Practitioner

## 2011-05-21 VITALS — BP 122/90 | HR 86 | Ht 69.5 in | Wt 176.0 lb

## 2011-05-21 DIAGNOSIS — I10 Essential (primary) hypertension: Secondary | ICD-10-CM

## 2011-05-21 DIAGNOSIS — E785 Hyperlipidemia, unspecified: Secondary | ICD-10-CM

## 2011-05-21 DIAGNOSIS — R079 Chest pain, unspecified: Secondary | ICD-10-CM | POA: Insufficient documentation

## 2011-05-21 DIAGNOSIS — R911 Solitary pulmonary nodule: Secondary | ICD-10-CM

## 2011-05-21 DIAGNOSIS — J984 Other disorders of lung: Secondary | ICD-10-CM

## 2011-05-21 MED ORDER — HYDROCHLOROTHIAZIDE 12.5 MG PO CAPS: 12.5000 mg | ORAL_CAPSULE | Freq: Every day | ORAL | Status: DC

## 2011-05-21 MED ORDER — ROSUVASTATIN CALCIUM 5 MG PO TABS: 5.0000 mg | ORAL_TABLET | Freq: Every day | ORAL | Status: DC

## 2011-05-21 NOTE — Assessment & Plan Note (Signed)
Will start on low dose Crestor at 5 mg each night. Recheck labs in 4 weeks.

## 2011-05-21 NOTE — Assessment & Plan Note (Signed)
No further chest pain/tightness. Coronary CT showing soft plaque in the RCA and otherwise ok. Calcium score was 0. Will treat risk factors.

## 2011-05-21 NOTE — Patient Instructions (Addendum)
We are going to start you on cholesterol medicine - Crestor 5 mg each night.  I am adding HCTZ 12.5 mg daily to help with your blood pressure.  Check your blood pressures at home.  I want to see you in a month with fasting labs.  We are going to get an ultrasound of your heart.   Call the [facility] office at [phone] if you have any questions, problems or concerns.

## 2011-05-21 NOTE — Assessment & Plan Note (Signed)
Will need follow up in one year. He is aware.

## 2011-05-21 NOTE — Assessment & Plan Note (Addendum)
Diastolic blood pressure remains up. I have added HCTZ 12.5 mg daily. He will continue to monitor at home. Will also check an echo looking for LVH. Has had history of rheumatic fever as a child. No murmur on exam. Patient is agreeable to this plan and will call if any problems develop in the interim.

## 2011-05-21 NOTE — Progress Notes (Signed)
Nicholas Paul Date of Birth: [birth year] Medical Record #[id number]  History of Present Illness: Nicholas Paul is seen today for a follow up visit. He is seen for Dr. Patty Sermons. He was in the ER last month after some atypical chest pain. He had been having issues with constipation and possible prostatitis. Blood pressure had been trending up. He had been treated with Norvasc and Cipro.  His evaluation in the ER showed negative enzymes. Coronary CT shows a soft plaque in the RCA and is otherwise normal. He was also noted to have a 4 mm pulmonary nodule. Will need follow up scan in one year.  Currently doing well. Has no complaints. Diastolic blood pressure remains elevated. Has not returned to the gym. Not on cholesterol medicine. No further chest pain.   Current Outpatient Prescriptions on File Prior to Visit  Medication Sig Dispense Refill  . amLODipine (NORVASC) 5 MG tablet Take 5 mg by mouth daily.        Marland Kitchen aspirin 81 MG tablet Take 81 mg by mouth daily.        . famotidine (PEPCID AC) 10 MG chewable tablet Chew 10 mg by mouth as needed. For heartburn.      . omega-3 acid ethyl esters (LOVAZA) 1 G capsule Take 1 g by mouth daily.        . psyllium (METAMUCIL) 58.6 % packet Take 1 packet by mouth daily.        . quinapril (ACCUPRIL) 40 MG tablet Take 40 mg by mouth at bedtime.        Marland Kitchen DISCONTD: amLODipine (NORVASC) 5 MG tablet Take 1 tablet (5 mg total) by mouth daily.  30 tablet  11    Allergies  Allergen Reactions  . Doxazosin Mesylate     REACTION: edema on the generic    Past Medical History  Diagnosis Date  . Hyperlipidemia   . Hematuria, microscopic   . Hypertension     Past Surgical History  Procedure Date  . Vasectomy   . Cystoscopy     X4 , Dr Vonita Moss  . Tonsillectomy and adenoidectomy   . Colonoscopy w/ polypectomy 2006    ? hyperplastic    History  Smoking status  . Never Smoker   Smokeless tobacco  . Not on file    History  Alcohol Use  . 7.2  oz/week  . 5 Glasses of wine, 7 Cans of beer per week    Family History  Problem Relation Age of Onset  . Cancer Father     bladder cancer  . Cancer Brother     bladder cancer  . Hypertension Brother   . Cancer Paternal Aunt     bladder cancer  . Hyperlipidemia Mother   . Hypertension Mother     Review of Systems: The review of systems is per the HPI. Blood pressure diary from home is reviewed. All other systems were reviewed and are negative.  Physical Exam: BP 122/90  Pulse 86  Ht 5' 9.5" (1.765 m)  Wt 176 lb (79.833 kg)  BMI 25.62 kg/m2 Patient is very pleasant and in no acute distress. Skin is warm and dry. Color is normal.  HEENT is unremarkable. Normocephalic/atraumatic. PERRL. Sclera are nonicteric. Neck is supple. No masses. No JVD. Lungs are clear. Cardiac exam shows a regular rate and rhythm. No murmur. Abdomen is soft. Extremities are without edema. Gait and ROM are intact. No gross neurologic deficits noted.  LABORATORY DATA:   Assessment / Plan:

## 2011-05-28 ENCOUNTER — Ambulatory Visit: Payer: No Typology Code available for payment source

## 2011-06-05 ENCOUNTER — Encounter: Payer: Self-pay | Admitting: Internal Medicine

## 2011-06-05 ENCOUNTER — Ambulatory Visit (INDEPENDENT_AMBULATORY_CARE_PROVIDER_SITE_OTHER): Payer: No Typology Code available for payment source | Admitting: Internal Medicine

## 2011-06-05 VITALS — BP 122/80 | HR 88 | Ht 69.5 in | Wt 177.0 lb

## 2011-06-05 DIAGNOSIS — K589 Irritable bowel syndrome without diarrhea: Secondary | ICD-10-CM

## 2011-06-05 NOTE — Progress Notes (Signed)
  Subjective:    Patient ID: Nicholas Paul, male    DOB: [birth year], 61 y.o.   MRN: [id number]  HPI Severe peiumbilical pain in December. Also some lower back. He saw Dr. Alwyn Ren and ? Of prostatitis. Cipro x 10 days and ended on 12/18/ Was better in lower back but still having abdominal pain and BP was elevated. Constant upper abdominal pressure - constipated some, BP was 170/95 and higher pressure in chest. Went to urgent care and then was admitted. Had a coronary CT with a plaque bt no causative lesions. BP has been elevated some, still. Had cardiology follow-up - amlodipine was started. In general he is getting better overall. BP better.   Has not had any heartburn or indigestion.   Review of Systems Back pain as above   Has an old friend and business partner dying of pancreatic cancer - on his mind Objective:   Physical Exam General:  NAD Eyes:   anicteric Lungs:  clear Heart:  S1S2 no rubs, murmurs or gallops Abdomen:  soft and nontender, BS+     Data Reviewed:  Cardiac CT, recent labs in December, CBC metabolic panel normal       Assessment & Plan:

## 2011-06-05 NOTE — Patient Instructions (Signed)
Follow-up to see Dr. Leone Payor as needed. Sounds like things are getting better, if recurrent problems or symptoms please give [place] a call back.

## 2011-06-05 NOTE — Assessment & Plan Note (Signed)
It sounds like this flared up, though I can't really sort things out. We know he has this in the background, he may have had a gastroenteritis with prolonged reaction. I'm not certain why the blood pressure would be related, it could have been reactive. At this point he is better so we will observe and he will followup as needed. He was reassured, I don't think any testing is needed.

## 2011-06-20 ENCOUNTER — Ambulatory Visit (INDEPENDENT_AMBULATORY_CARE_PROVIDER_SITE_OTHER): Payer: No Typology Code available for payment source | Admitting: Nurse Practitioner

## 2011-06-20 ENCOUNTER — Encounter: Payer: Self-pay | Admitting: Nurse Practitioner

## 2011-06-20 ENCOUNTER — Telehealth: Payer: Self-pay | Admitting: *Deleted

## 2011-06-20 ENCOUNTER — Ambulatory Visit (HOSPITAL_COMMUNITY): Payer: No Typology Code available for payment source | Attending: Cardiovascular Disease | Admitting: Radiology

## 2011-06-20 ENCOUNTER — Other Ambulatory Visit: Payer: No Typology Code available for payment source | Admitting: *Deleted

## 2011-06-20 VITALS — BP 120/82 | HR 80 | Ht 69.5 in | Wt 174.0 lb

## 2011-06-20 DIAGNOSIS — I10 Essential (primary) hypertension: Secondary | ICD-10-CM | POA: Insufficient documentation

## 2011-06-20 DIAGNOSIS — R079 Chest pain, unspecified: Secondary | ICD-10-CM

## 2011-06-20 DIAGNOSIS — R899 Unspecified abnormal finding in specimens from other organs, systems and tissues: Secondary | ICD-10-CM

## 2011-06-20 DIAGNOSIS — R072 Precordial pain: Secondary | ICD-10-CM

## 2011-06-20 DIAGNOSIS — E785 Hyperlipidemia, unspecified: Secondary | ICD-10-CM | POA: Insufficient documentation

## 2011-06-20 DIAGNOSIS — R911 Solitary pulmonary nodule: Secondary | ICD-10-CM

## 2011-06-20 LAB — BASIC METABOLIC PANEL
BUN: 10 mg/dL (ref 6–23)
CO2: 30 mEq/L (ref 19–32)
Calcium: 9.2 mg/dL (ref 8.4–10.5)
Chloride: 100 mEq/L (ref 96–112)
Creatinine, Ser: 0.9 mg/dL (ref 0.4–1.5)
GFR: 87.61 mL/min (ref >60.00)
Glucose, Bld: 100 mg/dL — ABNORMAL HIGH (ref 70–99)
Potassium: 4.1 mEq/L (ref 3.5–5.1)
Sodium: 137 mEq/L (ref 135–145)

## 2011-06-20 LAB — HEPATIC FUNCTION PANEL
ALT: 38 U/L (ref 0–53)
AST: 26 U/L (ref 0–37)
Albumin: 4.3 g/dL (ref 3.5–5.2)
Alkaline Phosphatase: 71 U/L (ref 39–117)
Bilirubin, Direct: 0.2 mg/dL (ref 0.0–0.3)
Total Bilirubin: 1.8 mg/dL — ABNORMAL HIGH (ref 0.3–1.2)
Total Protein: 7.9 g/dL (ref 6.0–8.3)

## 2011-06-20 LAB — LIPID PANEL
Cholesterol: 143 mg/dL (ref 0–200)
HDL: 57.9 mg/dL (ref >39.00)
LDL Cholesterol: 75 mg/dL (ref 0–99)
Total CHOL/HDL Ratio: 2
Triglycerides: 50 mg/dL (ref 0.0–149.0)
VLDL: 10 mg/dL (ref 0.0–40.0)

## 2011-06-20 MED ORDER — ROSUVASTATIN CALCIUM 5 MG PO TABS: 5.0000 mg | ORAL_TABLET | Freq: Every day | ORAL | Status: DC

## 2011-06-20 NOTE — Telephone Encounter (Signed)
Message copied by Burnell Blanks on Wed Jun 20, 2011  3:58 PM ------      Message from: [name]      Created: Wed Jun 20, 2011  1:48 PM       Ok to report. Labs are satisfactory.  Total bili up a little but has been elevated in the past. AST and ALT are normal. Stay on same medicines. Need to recheck HPF in about 2 months. Decrease alcohol intake.

## 2011-06-20 NOTE — Assessment & Plan Note (Signed)
No further chest pain. Will manage risk factors. Now on statin therapy and antihypertensives.

## 2011-06-20 NOTE — Telephone Encounter (Signed)
Message copied by Burnell Blanks on Wed Jun 20, 2011  3:57 PM ------      Message from: Cassell Clement      Created: Wed Jun 20, 2011  2:03 PM       2D echo is good.  Normal EF. Mild diastolic dysfunction.

## 2011-06-20 NOTE — Patient Instructions (Signed)
I think you are doing well.  Stay on your current medicines.  We will be calling you in a few days with your labs and your ultrasound report.  We will see you in 4 months.   Call the [facility] office at [phone] if you have any questions, problems or concerns.

## 2011-06-20 NOTE — Assessment & Plan Note (Signed)
Blood pressure diary from home is reviewed. He has adequate control. Will continue with his current RX. Will see what the echo shows. Will tentatively see him back in 4 months. Patient is agreeable to this plan and will call if any problems develop in the interim.

## 2011-06-20 NOTE — Assessment & Plan Note (Signed)
He is now on Crestor. Checking fasting labs today.

## 2011-06-20 NOTE — Telephone Encounter (Signed)
Advised of labs and echo. Scheduled 2 month check

## 2011-06-20 NOTE — Progress Notes (Signed)
Nicholas Paul Date of Birth: Jan 15, 1951 Medical Record #[id number]  History of Present Illness: Nicholas Paul is seen today for a one month check. He is seen for Dr. Patty Sermons. He had a recent ER visit for atypical chest pain. Now felt to be more GI related. Did have a coronary CT showing a soft plaque in the RCA and a 4mm pulmonary nodule. Needs repeat CT of the chest in December. At his last visit, we started statin and HCTZ.  He comes back today. He is doing well. Blood pressure has come down nicely. He is exercising without problems. No recurrent chest pain. Feels good on his medicines. Has had his echo this morning looking for LVH and he reports a history of rheumatic fever as a child. He has had a follow up visit with Dr. Leone Payor.   Current Outpatient Prescriptions on File Prior to Visit  Medication Sig Dispense Refill  . amLODipine (NORVASC) 5 MG tablet Take 5 mg by mouth daily.        Marland Kitchen aspirin 81 MG tablet Take 81 mg by mouth daily.        . Coenzyme Q10 (CO Q 10 PO) Take 1 capsule by mouth daily.      . famotidine (PEPCID AC) 10 MG chewable tablet Chew 10 mg by mouth as needed. For heartburn.      . hydrochlorothiazide (MICROZIDE) 12.5 MG capsule Take 1 capsule (12.5 mg total) by mouth daily.  30 capsule  6  . omega-3 acid ethyl esters (LOVAZA) 1 G capsule Take 1 g by mouth daily.        . psyllium (METAMUCIL) 58.6 % packet Take 2 packets by mouth 2 (two) times daily.       . quinapril (ACCUPRIL) 40 MG tablet Take 40 mg by mouth at bedtime.        Marland Kitchen DISCONTD: rosuvastatin (CRESTOR) 5 MG tablet Take 1 tablet (5 mg total) by mouth at bedtime.  30 tablet  11  . DISCONTD: amLODipine (NORVASC) 5 MG tablet Take 1 tablet (5 mg total) by mouth daily.  30 tablet  11    Allergies  Allergen Reactions  . Doxazosin Mesylate     REACTION: edema on the generic    Past Medical History  Diagnosis Date  . Hyperlipidemia   . Hematuria, microscopic   . Hypertension   . Prostatitis   .  Chest pain     ER visit 12/12 with coronary CT showing 50% soft stenosis in the RCA with otherwise patent vessels and calcium score of 0  . Pulmonary nodule     Noted on CT scan in 12/12. Needs follow up in one year.   . H/O: rheumatic fever     noted after tonsillectomy  . Adenomatous colon polyp   . Gilbert's syndrome   . Internal hemorrhoids   . IBS (irritable bowel syndrome)   . GERD (gastroesophageal reflux disease)   . Coronary artery disease     Past Surgical History  Procedure Date  . Vasectomy   . Cystoscopy     X4 , Dr Vonita Moss  . Tonsillectomy and adenoidectomy   . Colonoscopy w/ polypectomy 12/06/2009    internal hemorrhoids    History  Smoking status  . Never Smoker   Smokeless tobacco  . Not on file    History  Alcohol Use  . 7.2 oz/week  . 5 Glasses of wine, 7 Cans of beer per week    Family History  Problem  Relation Age of Onset  . Cancer Father     bladder cancer  . Cancer Brother     bladder cancer  . Hypertension Brother   . Cancer Paternal Aunt     bladder cancer  . Hyperlipidemia Mother   . Hypertension Mother     Review of Systems: The review of systems is per the HPI.  All other systems were reviewed and are negative.  Physical Exam: BP 120/82  Pulse 80  Ht 5' 9.5" (1.765 m)  Wt 174 lb (78.926 kg)  BMI 25.33 kg/m2 Patient is very pleasant and in no acute distress. Skin is warm and dry. Color is normal.  HEENT is unremarkable. Normocephalic/atraumatic. PERRL. Sclera are nonicteric. Neck is supple. No masses. No JVD. Lungs are clear. Cardiac exam shows a regular rate and rhythm. Abdomen is soft. Extremities are without edema. Gait and ROM are intact. No gross neurologic deficits noted.   LABORATORY DATA:   Assessment / Plan:

## 2011-06-20 NOTE — Assessment & Plan Note (Signed)
Patient is aware that he needs follow up in December.

## 2011-07-30 ENCOUNTER — Ambulatory Visit (INDEPENDENT_AMBULATORY_CARE_PROVIDER_SITE_OTHER): Payer: No Typology Code available for payment source | Admitting: Internal Medicine

## 2011-07-30 ENCOUNTER — Encounter: Payer: Self-pay | Admitting: Internal Medicine

## 2011-07-30 VITALS — BP 122/84 | HR 73 | Wt 177.0 lb

## 2011-07-30 DIAGNOSIS — R911 Solitary pulmonary nodule: Secondary | ICD-10-CM

## 2011-07-30 DIAGNOSIS — K589 Irritable bowel syndrome without diarrhea: Secondary | ICD-10-CM

## 2011-07-30 DIAGNOSIS — I10 Essential (primary) hypertension: Secondary | ICD-10-CM

## 2011-07-30 DIAGNOSIS — E785 Hyperlipidemia, unspecified: Secondary | ICD-10-CM

## 2011-07-30 MED ORDER — SILDENAFIL CITRATE 100 MG PO TABS
100.0000 mg | ORAL_TABLET | Freq: Every day | ORAL | Status: DC | PRN
Start: 2011-07-30 — End: 2016-12-14

## 2011-07-30 NOTE — Assessment & Plan Note (Signed)
The concept of a "doubling time" of 40-400 days was discussed. He is at low risk; limited CT without contrast will be performed in December 2013. If the nodule is unchanged at that time; no further followup is necessary.

## 2011-07-30 NOTE — Assessment & Plan Note (Signed)
He is essentially asymptomatic at present. If he has significant symptoms sublingual generic Levsin would be prescribed

## 2011-07-30 NOTE — Assessment & Plan Note (Signed)
LDL is almost ideal at 75; no change in Crestor indicated.

## 2011-07-30 NOTE — Patient Instructions (Addendum)
Preventive Health Care: Exercise at least 30-45 minutes a day,  3-4 days a week.  Eat a low-fat diet with lots of fruits and vegetables, up to 7-9 servings per day. Consume less than 40 grams of sugar per day from foods & drinks with High Fructose Corn Sugar as # 1,2,3 or # 4 on label. Alcohol If you drink, do it moderately,less than 9 drinks per week, preferably less than 6 @ most. Blood Pressure Goal  Ideally is an AVERAGE < 135/85. This AVERAGE should be calculated from @ least 5-7 BP readings taken @ different times of day on different days of week. You should not respond to isolated BP readings , but rather the AVERAGE for that week  The total bilirubin elevation is called Gilbert's Syndrome & is not significant. It is not indicative of liver disease; the total bilirubin will simply fluctuate slightly without any associated clinical  symptoms or signs.

## 2011-07-30 NOTE — Assessment & Plan Note (Signed)
Blood pressure is well-controlled; no change in medicines indicated. Meds may be weaned in the future if the blood pressure is significantly lower than the present average of 120-125/88

## 2011-07-30 NOTE — Progress Notes (Signed)
  Subjective:    Patient ID: Nicholas Paul, male    DOB: October 12, 1950, 61 y.o.   MRN: [id number]  HPI He is here to discuss his recent medical history and his medication regimen. Hospital records, cardiology office visit follow up, imaging and labs were reviewed.  Blood pressure is well controlled at this time an average of 120-125/75-85. His symptoms of irritable bowel have also resolved.  Labs 06/20/11 revealed glucose of 100, HDL 58 and LDL of 75.  CT angiogram of the heart 05/03/11 revealed the 50% soft plaque in the right coronary artery and a 4 mm right middle lobe pulmonary nodule. He has never smoked and is therefore at low risk in relationship to the nodule    Review of Systems On 06/20/11 the total bilirubin was 1.8. AST and ALT were  normal. This represents Gilbert's syndrome which is of no concern. This is unrelated to alcohol intake  Essentially asymptomatic except for minor erectile symptoms. His testosterone values were normal 05/04/11     Objective:   Physical Exam He appears healthy and well-nourished; he is in no acute distress  No scleral icterus is present  No carotid bruits are present.  Heart rhythm and rate are normal with no significant murmurs or gallops. S 4  Chest is clear with no increased work of breathing  There is no evidence of aortic aneurysm or renal artery bruits  No organomegaly or masses are noted  He has no clubbing or edema.   Pedal pulses are intact   No ischemic skin changes are present ; no jaundice is present        Assessment & Plan:

## 2011-08-21 ENCOUNTER — Telehealth: Payer: Self-pay | Admitting: Cardiology

## 2011-08-21 ENCOUNTER — Other Ambulatory Visit (INDEPENDENT_AMBULATORY_CARE_PROVIDER_SITE_OTHER): Payer: No Typology Code available for payment source

## 2011-08-21 DIAGNOSIS — R079 Chest pain, unspecified: Secondary | ICD-10-CM

## 2011-08-21 DIAGNOSIS — E785 Hyperlipidemia, unspecified: Secondary | ICD-10-CM

## 2011-08-21 DIAGNOSIS — I10 Essential (primary) hypertension: Secondary | ICD-10-CM

## 2011-08-21 DIAGNOSIS — R911 Solitary pulmonary nodule: Secondary | ICD-10-CM

## 2011-08-21 DIAGNOSIS — R899 Unspecified abnormal finding in specimens from other organs, systems and tissues: Secondary | ICD-10-CM

## 2011-08-21 LAB — BASIC METABOLIC PANEL
BUN: 13 mg/dL (ref 6–23)
CO2: 30 mEq/L (ref 19–32)
Calcium: 8.7 mg/dL (ref 8.4–10.5)
Chloride: 102 mEq/L (ref 96–112)
Creatinine, Ser: 1.1 mg/dL (ref 0.4–1.5)
GFR: 71.39 mL/min (ref >60.00)
Glucose, Bld: 108 mg/dL — ABNORMAL HIGH (ref 70–99)
Potassium: 4.3 mEq/L (ref 3.5–5.1)
Sodium: 138 mEq/L (ref 135–145)

## 2011-08-21 LAB — HEPATIC FUNCTION PANEL
ALT: 27 U/L (ref 0–53)
AST: 21 U/L (ref 0–37)
Albumin: 4 g/dL (ref 3.5–5.2)
Alkaline Phosphatase: 71 U/L (ref 39–117)
Bilirubin, Direct: 0.1 mg/dL (ref 0.0–0.3)
Total Bilirubin: 1.2 mg/dL (ref 0.3–1.2)
Total Protein: 7 g/dL (ref 6.0–8.3)

## 2011-08-21 LAB — LIPID PANEL
Cholesterol: 146 mg/dL (ref 0–200)
HDL: 51.6 mg/dL (ref >39.00)
LDL Cholesterol: 80 mg/dL (ref 0–99)
Total CHOL/HDL Ratio: 3
Triglycerides: 72 mg/dL (ref 0.0–149.0)
VLDL: 14.4 mg/dL (ref 0.0–40.0)

## 2011-08-21 NOTE — Telephone Encounter (Signed)
Message copied by Burnell Blanks on Tue Aug 21, 2011  3:21 PM ------      Message from: [name]      Created: Tue Aug 21, 2011  1:15 PM       Ok to report. Labs are satisfactory.  Stay on same medicines. Recheck BMET/HPF/LIPIDS  in 6 months.       Bilirubin now normal with cutting back on his alcohol intake.

## 2011-08-21 NOTE — Telephone Encounter (Signed)
Advised patient of labs.  Dr Alwyn Ren PCP will be checking labs in August at CPE

## 2011-10-16 ENCOUNTER — Ambulatory Visit: Payer: No Typology Code available for payment source | Admitting: Cardiology

## 2011-12-17 ENCOUNTER — Other Ambulatory Visit: Payer: Self-pay | Admitting: Nurse Practitioner

## 2011-12-31 ENCOUNTER — Telehealth: Payer: Self-pay | Admitting: Internal Medicine

## 2011-12-31 DIAGNOSIS — E785 Hyperlipidemia, unspecified: Secondary | ICD-10-CM

## 2011-12-31 DIAGNOSIS — I10 Essential (primary) hypertension: Secondary | ICD-10-CM

## 2011-12-31 DIAGNOSIS — Z0000 Encounter for general adult medical examination without abnormal findings: Secondary | ICD-10-CM

## 2011-12-31 DIAGNOSIS — T887XXA Unspecified adverse effect of drug or medicament, initial encounter: Secondary | ICD-10-CM

## 2011-12-31 NOTE — Telephone Encounter (Signed)
Please verify where patient would like to get labs (here or [facility]) and I will place future orders

## 2011-12-31 NOTE — Telephone Encounter (Signed)
lmovm for patient to call office. °

## 2011-12-31 NOTE — Telephone Encounter (Signed)
Pt would like to have labs done prior to his CPE. He is calling his insurance co to find out if they will cover them. Patient states he would like to have them done before the physical whether his insurance pays for it or not. What labs will he need?

## 2012-01-02 NOTE — Telephone Encounter (Signed)
Pt returned call. Will come to our office for labs.

## 2012-01-02 NOTE — Telephone Encounter (Signed)
Orders placed.

## 2012-01-17 ENCOUNTER — Other Ambulatory Visit: Payer: Self-pay | Admitting: Internal Medicine

## 2012-02-19 ENCOUNTER — Other Ambulatory Visit: Payer: Self-pay | Admitting: *Deleted

## 2012-02-20 ENCOUNTER — Other Ambulatory Visit: Payer: Self-pay | Admitting: Cardiology

## 2012-02-20 NOTE — Telephone Encounter (Signed)
New problem:  Need the nurse to call to discuss medication - zocor

## 2012-02-25 ENCOUNTER — Other Ambulatory Visit: Payer: Self-pay | Admitting: *Deleted

## 2012-02-25 ENCOUNTER — Telehealth: Payer: Self-pay | Admitting: *Deleted

## 2012-02-25 DIAGNOSIS — E785 Hyperlipidemia, unspecified: Secondary | ICD-10-CM

## 2012-02-25 MED ORDER — ATORVASTATIN CALCIUM 10 MG PO TABS: 10.0000 mg | ORAL_TABLET | Freq: Every day | ORAL | Status: DC

## 2012-02-25 NOTE — Telephone Encounter (Signed)
t/w pt changed med from crestor 5 to lipitor 10mg  per Lawson Fiscal flp/lft 04/14/2012 pt aware

## 2012-02-28 ENCOUNTER — Telehealth: Payer: Self-pay | Admitting: Cardiology

## 2012-02-28 NOTE — Telephone Encounter (Signed)
New Problem:    Patient has both medications , Crestor and Lipitor, filled at his pharmacy, Crestor had finally been approved, and would like to know which one to take.  Please call back.

## 2012-03-03 ENCOUNTER — Encounter: Payer: Self-pay | Admitting: Internal Medicine

## 2012-03-03 ENCOUNTER — Ambulatory Visit (INDEPENDENT_AMBULATORY_CARE_PROVIDER_SITE_OTHER): Payer: BC Managed Care – PPO | Admitting: Internal Medicine

## 2012-03-03 ENCOUNTER — Other Ambulatory Visit (INDEPENDENT_AMBULATORY_CARE_PROVIDER_SITE_OTHER): Payer: BC Managed Care – PPO

## 2012-03-03 VITALS — BP 126/74 | HR 84 | Temp 98.2°F | Wt 176.0 lb

## 2012-03-03 DIAGNOSIS — I10 Essential (primary) hypertension: Secondary | ICD-10-CM

## 2012-03-03 DIAGNOSIS — K5900 Constipation, unspecified: Secondary | ICD-10-CM

## 2012-03-03 DIAGNOSIS — Z0000 Encounter for general adult medical examination without abnormal findings: Secondary | ICD-10-CM

## 2012-03-03 DIAGNOSIS — N419 Inflammatory disease of prostate, unspecified: Secondary | ICD-10-CM

## 2012-03-03 DIAGNOSIS — T887XXA Unspecified adverse effect of drug or medicament, initial encounter: Secondary | ICD-10-CM

## 2012-03-03 DIAGNOSIS — E785 Hyperlipidemia, unspecified: Secondary | ICD-10-CM

## 2012-03-03 DIAGNOSIS — M545 Low back pain, unspecified: Secondary | ICD-10-CM

## 2012-03-03 LAB — POCT URINALYSIS DIPSTICK
Blood, UA: NEGATIVE
Glucose, UA: NEGATIVE
Nitrite, UA: NEGATIVE
Protein, UA: NEGATIVE
Spec Grav, UA: 1.01
Urobilinogen, UA: 0.2

## 2012-03-03 LAB — LIPID PANEL
Cholesterol: 134 mg/dL (ref 0–200)
HDL: 55.4 mg/dL (ref >39.00)
LDL Cholesterol: 69 mg/dL (ref 0–99)
Total CHOL/HDL Ratio: 2
Triglycerides: 50 mg/dL (ref 0.0–149.0)
VLDL: 10 mg/dL (ref 0.0–40.0)

## 2012-03-03 LAB — HEPATIC FUNCTION PANEL
ALT: 21 U/L (ref 0–53)
AST: 22 U/L (ref 0–37)
Bilirubin, Direct: 0.2 mg/dL (ref 0.0–0.3)
Total Bilirubin: 1.3 mg/dL — ABNORMAL HIGH (ref 0.3–1.2)

## 2012-03-03 LAB — CBC WITH DIFFERENTIAL/PLATELET
Basophils Absolute: 0.1 10*3/uL (ref 0.0–0.1)
Eosinophils Absolute: 0.1 10*3/uL (ref 0.0–0.7)
HCT: 43.2 % (ref 39.0–52.0)
Lymphocytes Relative: 31.1 % (ref 12.0–46.0)
Lymphs Abs: 1.6 10*3/uL (ref 0.7–4.0)
MCHC: 33.7 g/dL (ref 30.0–36.0)
Monocytes Relative: 7.8 % (ref 3.0–12.0)
Neutro Abs: 3 10*3/uL (ref 1.4–7.7)
Platelets: 197 10*3/uL (ref 150.0–400.0)
RDW: 12.4 % (ref 11.5–14.6)

## 2012-03-03 LAB — BASIC METABOLIC PANEL
BUN: 14 mg/dL (ref 6–23)
CO2: 30 mEq/L (ref 19–32)
Calcium: 9 mg/dL (ref 8.4–10.5)
GFR: 86.33 mL/min (ref >60.00)
Glucose, Bld: 98 mg/dL (ref 70–99)

## 2012-03-03 LAB — TSH: TSH: 0.56 u[IU]/mL (ref 0.35–5.50)

## 2012-03-03 MED ORDER — CIPROFLOXACIN HCL 500 MG PO TABS: 500.0000 mg | ORAL_TABLET | Freq: Two times a day (BID) | ORAL | Status: DC

## 2012-03-03 NOTE — Patient Instructions (Addendum)
If you activate My Chart; the results can be released to you as soon as they populate from the lab. If you choose not to use this program; the labs have to be reviewed, copied & mailed   causing a delay in getting the results to you. 

## 2012-03-03 NOTE — Progress Notes (Signed)
  Subjective:    Patient ID: Nicholas Paul, male    DOB: April 20, 1951, 61 y.o.   MRN: [id number]  HPI BACK PAIN: Onset:  About 8 weeks ago  Location: low back pain across lower back  Quality: dull pain   Worse with: with golf and after waking up from sleep     Better with: ibuprofen & with BM . He took one wife's Cipro last night & has had 5 BMS since Radiation: none Trauma: none Best sitting/standing/leaning forward: no    Review of Systems  Pt reports having dull chest pressure in context of constipation & bloating. It is not exertional or radiating to left arm or back.It is worse with thorax rotation.  Pt reports having leg cramps. Pt reports leg cramps relieved when not taking Crestor for 5 days. Leg cramps started after restarting Crestor from Cardiology. He was never on Lipitor. Fecal/urinary incontinence: no Numbness/Weakness: no Fever/chills/sweats: no  Hematuria/pyuria/dysuria: no Night pain:no Unexplained weight loss: no No relief with bedrest: no  h/o cancer/immunosuppression: no PMH of osteoporosis or chronic steroid use: no      Objective:   Physical Exam Gen.: Healthy and well-nourished in appearance. Alert, appropriate and cooperative throughout exam. Head: Normocephalic without obvious abnormalities Eyes: No corneal or conjunctival inflammation noted. Abdomen: Bowel sounds normal; abdomen soft and nontender. No masses, organomegaly or hernias noted.                                                                               Musculoskeletal/extremities: No deformity or scoliosis noted of  the thoracic or lumbar spine. No clubbing, cyanosis, edema, or deformity noted. Range of motion  normal .Tone & strength  normal.Joints normal. Nail health  Good.  Clinically no definite costochondritis is present; there is no pain with compression of the thorax Genitalia normal except for left varices . The prostate exhibits some mild asymmetry with induration on and tenderness  on the right. No nodules are palpable Neurologic: Alert and oriented x3. Deep tendon reflexes symmetrical and normal.          Skin: Intact without suspicious lesions or rashes. Lymph: No cervical, axillary, or inguinal lymphadenopathy present. Psych: Mood and affect are normal. Normally interactive                                                                                         Assessment & Plan:  #1 low back pain, probably referable to acute prostatitis  #2 constipation and bloating dramatically responsive to one dose of Cipro  #3 dyslipidemia with muscle pain on low-dose Crestor. CK should be monitored when symptoms are active. Vitamin D level should also be assessed.

## 2012-03-10 ENCOUNTER — Encounter: Payer: Self-pay | Admitting: Internal Medicine

## 2012-03-10 ENCOUNTER — Ambulatory Visit (INDEPENDENT_AMBULATORY_CARE_PROVIDER_SITE_OTHER): Payer: BC Managed Care – PPO | Admitting: Internal Medicine

## 2012-03-10 VITALS — BP 126/82 | HR 75 | Temp 98.2°F | Resp 12 | Ht 70.0 in | Wt 176.0 lb

## 2012-03-10 DIAGNOSIS — Z0000 Encounter for general adult medical examination without abnormal findings: Secondary | ICD-10-CM

## 2012-03-10 DIAGNOSIS — I10 Essential (primary) hypertension: Secondary | ICD-10-CM

## 2012-03-10 NOTE — Patient Instructions (Addendum)
Preventive Health Care: Exercise at least 30-45 minutes a day,  3-4 days a week.  Eat a low-fat diet with lots of fruits and vegetables, up to 7-9 servings per day.  Consume less than 40 grams of sugar per day from foods & drinks with High Fructose Corn Sugar as #1,2,3 or # 4 on label. Health Care Power of Attorney & Living Will. Complete if not in place ; these place you in charge of your health care decisions. Take the EKG to any emergency room or preop visits. There are nonspecific changes; as long as there is no new change these are not clinically significant

## 2012-03-10 NOTE — Progress Notes (Signed)
  Subjective:    Patient ID: Nicholas Paul, male    DOB: 04-05-1951, 61 y.o.   MRN: [id number]  HPI   [name] is here for a physical;acute issues include intermittent discomfort in his legs and knees down, especially after exercise such as playing golf.      Review of Systems  Based on the advanced cholesterol testing his LDL goal is less than 100, ideally less than 70. He has not been exercising for 2 months. He's on no specific diet program; he does have evidence of some coronary artery on CT. The leg discomfort is typically in the morning in both legs; he denies claudication. He also denies chest pain, palpitations, dyspnea, or edema     Objective:   Physical Exam Gen.: Healthy and well-nourished in appearance. Alert, appropriate and cooperative throughout exam. Head: Normocephalic without obvious abnormalities Eyes: No corneal or conjunctival inflammation noted. Pupils equal round reactive to light and accommodation. Fundal exam is benign without hemorrhages, exudate, papilledema. Extraocular motion intact. Vision grossly normal with lenses. Ears: External  ear exam reveals no significant lesions or deformities. Canals clear .TMs normal. Hearing is grossly decreased bilaterally. Nose: External nasal exam reveals no deformity or inflammation. Nasal mucosa are pink and moist. No lesions or exudates noted.   Mouth: Oral mucosa and oropharynx reveal no lesions or exudates. Teeth in good repair. Neck: No deformities, masses, or tenderness noted. Range of motion & Thyroid normal. Lungs: Normal respiratory effort; chest expands symmetrically. Lungs are clear to auscultation without rales, wheezes, or increased work of breathing. Heart: Normal rate and rhythm. Normal S1 and S2. No gallop, click, or rub. S4 w/o murmur. Abdomen: Bowel sounds normal; abdomen soft and nontender. No masses, organomegaly or hernias noted. Genitalia: recently completed                                                                                  Musculoskeletal/extremities: No deformity or scoliosis noted of  the thoracic or lumbar spine. No clubbing, cyanosis, edema, or deformity noted. Range of motion  normal .Tone & strength  normal.Joints normal. Nail health  Good. Homan's negative Vascular: Carotid, radial artery, dorsalis pedis and  posterior tibial pulses are full and equal. No bruits present. Neurologic: Alert and oriented x3. Deep tendon reflexes symmetrical and normal.          Skin: Intact without suspicious lesions or rashes. Lymph: No cervical, axillary lymphadenopathy present. Psych: Mood and affect are normal. Normally interactive                                                                                         Assessment & Plan:  #1 comprehensive physical exam; no acute findings #2 myalgias Plan: see Orders

## 2012-03-11 LAB — CK: Total CK: 141 U/L (ref 7–232)

## 2012-03-14 ENCOUNTER — Telehealth: Payer: Self-pay | Admitting: *Deleted

## 2012-03-14 NOTE — Telephone Encounter (Signed)
pt stayed on crestor 5 mg, cholesteral way down want, checked at Dr. Caryl Never office, wants to know if he has to come back in december for repeat labs since didnt change meds and due to good labs

## 2012-03-14 NOTE — Telephone Encounter (Signed)
pt is staying on crestor lab appt cancelled for dec per Norma Fredrickson, NP

## 2012-03-15 NOTE — Telephone Encounter (Signed)
Does not need recheck in December if he is going to stay on Crestor.

## 2012-04-14 ENCOUNTER — Other Ambulatory Visit: Payer: No Typology Code available for payment source

## 2012-05-16 ENCOUNTER — Other Ambulatory Visit: Payer: Self-pay | Admitting: Internal Medicine

## 2012-05-21 ENCOUNTER — Ambulatory Visit (INDEPENDENT_AMBULATORY_CARE_PROVIDER_SITE_OTHER): Payer: BC Managed Care – PPO

## 2012-05-21 DIAGNOSIS — Z23 Encounter for immunization: Secondary | ICD-10-CM

## 2012-06-14 ENCOUNTER — Other Ambulatory Visit: Payer: Self-pay | Admitting: Internal Medicine

## 2012-07-03 ENCOUNTER — Other Ambulatory Visit: Payer: Self-pay | Admitting: Nurse Practitioner

## 2012-07-14 ENCOUNTER — Other Ambulatory Visit: Payer: Self-pay | Admitting: Nurse Practitioner

## 2012-08-25 ENCOUNTER — Other Ambulatory Visit: Payer: Self-pay | Admitting: Nurse Practitioner

## 2012-08-25 ENCOUNTER — Other Ambulatory Visit: Payer: Self-pay | Admitting: Internal Medicine

## 2012-10-27 ENCOUNTER — Other Ambulatory Visit: Payer: Self-pay | Admitting: *Deleted

## 2012-10-27 MED ORDER — HYDROCHLOROTHIAZIDE 12.5 MG PO CAPS: ORAL_CAPSULE | ORAL | Status: DC

## 2012-10-28 ENCOUNTER — Other Ambulatory Visit: Payer: Self-pay | Admitting: *Deleted

## 2012-10-28 DIAGNOSIS — I10 Essential (primary) hypertension: Secondary | ICD-10-CM

## 2012-10-28 MED ORDER — AMLODIPINE BESYLATE 5 MG PO TABS: ORAL_TABLET | ORAL | Status: DC

## 2012-10-28 NOTE — Telephone Encounter (Signed)
Refill for 90day supply of Norvasc sent to [facility] on [address]

## 2012-12-04 ENCOUNTER — Other Ambulatory Visit: Payer: Self-pay | Admitting: Cardiology

## 2012-12-16 ENCOUNTER — Other Ambulatory Visit: Payer: Self-pay | Admitting: Cardiology

## 2012-12-31 ENCOUNTER — Encounter: Payer: Self-pay | Admitting: Nurse Practitioner

## 2012-12-31 ENCOUNTER — Ambulatory Visit (INDEPENDENT_AMBULATORY_CARE_PROVIDER_SITE_OTHER): Payer: BC Managed Care – PPO | Admitting: Nurse Practitioner

## 2012-12-31 VITALS — BP 130/88 | HR 60 | Ht 69.0 in | Wt 174.4 lb

## 2012-12-31 DIAGNOSIS — E785 Hyperlipidemia, unspecified: Secondary | ICD-10-CM

## 2012-12-31 DIAGNOSIS — I10 Essential (primary) hypertension: Secondary | ICD-10-CM

## 2012-12-31 LAB — BASIC METABOLIC PANEL
BUN: 11 mg/dL (ref 6–23)
CO2: 31 mEq/L (ref 19–32)
Calcium: 9.3 mg/dL (ref 8.4–10.5)
Chloride: 101 mEq/L (ref 96–112)
Creatinine, Ser: 0.9 mg/dL (ref 0.4–1.5)
GFR: 86.1 mL/min (ref >60.00)
Glucose, Bld: 103 mg/dL — ABNORMAL HIGH (ref 70–99)
Potassium: 4.5 mEq/L (ref 3.5–5.1)
Sodium: 135 mEq/L (ref 135–145)

## 2012-12-31 LAB — CBC WITH DIFFERENTIAL/PLATELET
Basophils Absolute: 0 10*3/uL (ref 0.0–0.1)
Basophils Relative: 0.4 % (ref 0.0–3.0)
Eosinophils Absolute: 0 10*3/uL (ref 0.0–0.7)
Eosinophils Relative: 0.5 % (ref 0.0–5.0)
HCT: 43.6 % (ref 39.0–52.0)
Hemoglobin: 15.2 g/dL (ref 13.0–17.0)
Lymphocytes Relative: 27.8 % (ref 12.0–46.0)
Lymphs Abs: 1.7 10*3/uL (ref 0.7–4.0)
MCHC: 34.9 g/dL (ref 30.0–36.0)
MCV: 89.1 fl (ref 78.0–100.0)
Monocytes Absolute: 0.4 10*3/uL (ref 0.1–1.0)
Monocytes Relative: 6.6 % (ref 3.0–12.0)
Neutro Abs: 3.9 10*3/uL (ref 1.4–7.7)
Neutrophils Relative %: 64.7 % (ref 43.0–77.0)
Platelets: 193 10*3/uL (ref 150.0–400.0)
RBC: 4.89 Mil/uL (ref 4.22–5.81)
RDW: 12.1 % (ref 11.5–14.6)
WBC: 6.1 10*3/uL (ref 4.5–10.5)

## 2012-12-31 LAB — HEPATIC FUNCTION PANEL
ALT: 18 U/L (ref 0–53)
AST: 20 U/L (ref 0–37)
Albumin: 4 g/dL (ref 3.5–5.2)
Alkaline Phosphatase: 59 U/L (ref 39–117)
Bilirubin, Direct: 0.2 mg/dL (ref 0.0–0.3)
Total Bilirubin: 1.5 mg/dL — ABNORMAL HIGH (ref 0.3–1.2)
Total Protein: 7.3 g/dL (ref 6.0–8.3)

## 2012-12-31 LAB — LIPID PANEL
Cholesterol: 181 mg/dL (ref 0–200)
HDL: 53.2 mg/dL (ref >39.00)
LDL Cholesterol: 113 mg/dL — ABNORMAL HIGH (ref 0–99)
Total CHOL/HDL Ratio: 3
Triglycerides: 72 mg/dL (ref 0.0–149.0)
VLDL: 14.4 mg/dL (ref 0.0–40.0)

## 2012-12-31 MED ORDER — QUINAPRIL HCL 40 MG PO TABS: 40.0000 mg | ORAL_TABLET | Freq: Every day | ORAL | Status: DC

## 2012-12-31 MED ORDER — AMLODIPINE BESYLATE 5 MG PO TABS: ORAL_TABLET | ORAL | Status: DC

## 2012-12-31 MED ORDER — HYDROCHLOROTHIAZIDE 12.5 MG PO CAPS: 12.5000 mg | ORAL_CAPSULE | Freq: Every day | ORAL | Status: DC

## 2012-12-31 NOTE — Progress Notes (Signed)
Nicholas Paul Date of Birth: 06-12-1950 Medical Record #[id number]  History of Present Illness: Nicholas Paul is seen back today for a follow up visit. Seen for Nicholas Paul. Has had prior coronary CT showing soft plaque in the RCA and a 4 mm pulmonary nodule back in 2012 and a calcium score of 0. Has HTN and HLD.   Last seen in February of 2013 - was doing well.   Comes back today. Here alone. Doing ok. Remains very active. Exercising regularly by going to the gym and running. No chest pain. Has some left sided chest pain with movement - primarily swinging the golf club but absolutely no exertional pain. Not short of breath. Tolerating his medicines. BP averaging 130/80's at home. Has stopped his Crestor about 6 months ago due to myalgias (and from the advice of his dentist) - he is fasting today.   Current Outpatient Prescriptions  Medication Sig Dispense Refill  . amLODipine (NORVASC) 5 MG tablet TAKE 1 TABLET BY MOUTH ONCE DAILY  90 tablet  5  . aspirin 81 MG tablet Take 81 mg by mouth daily.        . Coenzyme Q10 (CO Q 10 PO) Take 1 capsule by mouth daily.      . hydrochlorothiazide (MICROZIDE) 12.5 MG capsule Take 1 capsule (12.5 mg total) by mouth daily.  15 capsule  0  . Omega-3 Fatty Acids (OMEGA 3 PO) Take by mouth daily.      . psyllium (METAMUCIL) 58.6 % packet Take 2 packets by mouth 2 (two) times daily.       . quinapril (ACCUPRIL) 40 MG tablet TAKE 1 TABLET BY MOUTH AT BEDTIME  90 tablet  2  . CRESTOR 5 MG tablet TAKE 1 TABLET BY MOUTH AT BEDTIME  30 tablet  9  . famotidine (PEPCID AC) 10 MG chewable tablet Chew 10 mg by mouth as needed. For heartburn.       No current facility-administered medications for this visit.    Allergies  Allergen Reactions  . Doxazosin Mesylate     REACTION: edema on the generic    Past Medical History  Diagnosis Date  . Hyperlipidemia   . Hematuria, microscopic   . Hypertension   . Prostatitis   . Chest pain     ER visit 12/12 with  coronary CT showing 50% soft stenosis in the RCA with otherwise patent vessels and calcium score of 0  . Pulmonary nodule     4 mm RML nodule on CT scan in 12/12. Needs follow up in one year.   . H/O: rheumatic fever     noted after tonsillectomy  . Adenomatous colon polyp   . Gilbert's syndrome   . Internal hemorrhoids   . IBS (irritable bowel syndrome)     Dr Nicholas Paul  . GERD (gastroesophageal reflux disease)   . Coronary artery disease     soft plaque with 50% narrowing R mid coronary artery    Past Surgical History  Procedure Laterality Date  . Vasectomy    . Cystoscopy      X4 , Dr Nicholas Paul; last 2011  . Tonsillectomy and adenoidectomy    . Colonoscopy w/ polypectomy  12/06/2009    internal hemorrhoids    History  Smoking status  . Never Smoker   Smokeless tobacco  . Not on file    History  Alcohol Use  . 7.2 oz/week  . 5 Glasses of wine, 7 Cans of beer per week  Family History  Problem Relation Age of Onset  . Cancer Father     bladder cancer  . Cancer Brother     bladder cancer  . Hypertension Brother   . Cancer Paternal Aunt     bladder cancer  . Hyperlipidemia Mother   . Hypertension Mother   . Stroke Neg Hx   . Diabetes Neg Hx     Review of Systems: The review of systems is per the HPI.  All other systems were reviewed and are negative.  Physical Exam: BP 130/88  Pulse 60  Ht 5\' 9"  (1.753 m)  Wt 174 lb 6.4 oz (79.107 kg)  BMI 25.74 kg/m2 Patient is very pleasant and in no acute distress. Skin is warm and dry. Color is normal.  HEENT is unremarkable. Normocephalic/atraumatic. PERRL. Sclera are nonicteric. Neck is supple. No masses. No JVD. Lungs are clear. Cardiac exam shows a regular rate and rhythm. Abdomen is soft. Extremities are without edema. Gait and ROM are intact. No gross neurologic deficits noted.  LABORATORY DATA:  Lab Results  Component Value Date   WBC 5.1 03/03/2012   HGB 14.6 03/03/2012   HCT 43.2 03/03/2012   PLT 197.0  03/03/2012   GLUCOSE 98 03/03/2012   CHOL 134 03/03/2012   TRIG 50.0 03/03/2012   HDL 55.40 03/03/2012   LDLCALC 69 03/03/2012   ALT 21 03/03/2012   AST 22 03/03/2012   NA 139 03/03/2012   K 4.0 03/03/2012   CL 104 03/03/2012   CREATININE 0.9 03/03/2012   BUN 14 03/03/2012   CO2 30 03/03/2012   TSH 0.56 03/03/2012   PSA 1.96 12/28/2010   Echo Study Conclusions from February 2013  - Left ventricle: The cavity size was normal. Wall thickness was increased in a pattern of mild LVH. Systolic function was normal. The estimated ejection fraction was in the range of 55% to 60%. Wall motion was normal; there were no regional wall motion abnormalities. Doppler parameters are consistent with abnormal left ventricular relaxation (grade 1 diastolic dysfunction). - Atrial septum: No defect or patent foramen ovale was identified.  CARDIAC CT IMPRESSION FROM 2012:  1. Right coronary artery disease. The patient's total coronary artery calcium score is zero. 2. 50% or slightly greater proximal to mid right coronary artery stenosis due to eccentric soft plaque. Post stenotic dilation of the right coronary artery. 3. Right coronary artery dominance. 4. Diminutive circumflex artery. 5. 4 mm right middle lobe pulmonary nodule. See followup recommendations above.  Report was called to Nicholas Paul at 05/03/2011 on 0923 hours.     Assessment / Plan:  1. CAD - needs CV risk factor modification - no symptoms reported.   2. HTN - BP looks good. Meds refilled today.   3. HLD - needs fasting labs today. He is agreeable to trying a different statin if needed (which I think is indicated given the past cardiac CT findings) - may have to do low dose just 3 times a week.   4. Lung nodule - no risk factors noted (no smoking, no lung cancer history, etc). No further follow up is felt to be needed. Discussed with Nicholas Paul and he is in agreement.   Will see him back in a year.   Patient is  agreeable to this plan and will call if any problems develop in the interim.   Nicholas Macadamia, RN, ANP-C Nicholas Paul [address] [place], [place]  [address]

## 2012-12-31 NOTE — Patient Instructions (Addendum)
We will check labs today  Stay on your current medicines  Stay active  We will see you in a year  Call the [facility] office at [phone] if you have any questions, problems or concerns.

## 2013-01-06 ENCOUNTER — Telehealth: Payer: Self-pay | Admitting: *Deleted

## 2013-01-06 DIAGNOSIS — I10 Essential (primary) hypertension: Secondary | ICD-10-CM

## 2013-01-06 DIAGNOSIS — E785 Hyperlipidemia, unspecified: Secondary | ICD-10-CM

## 2013-01-06 MED ORDER — ATORVASTATIN CALCIUM 10 MG PO TABS: ORAL_TABLET | ORAL | Status: DC

## 2013-01-06 NOTE — Telephone Encounter (Signed)
Patient called back for his lab results. Informed patient of results and Thamas Jaegers, NP, advisement as follows: "Ok to report. Labs are satisfactory but would like to see his lipids better - especially would like to get the LDL lower - would he consider Lipitor 10 mg M-W-F only and recheck his labs (CMET/HPF) in 6 weeks".  Patient agreed to treatment plan. Rx called in to [facility]. Patient will come back first week of October to repeat labs.

## 2013-03-19 ENCOUNTER — Other Ambulatory Visit: Payer: Self-pay

## 2013-07-13 ENCOUNTER — Other Ambulatory Visit: Payer: Self-pay | Admitting: Nurse Practitioner

## 2013-08-28 ENCOUNTER — Other Ambulatory Visit: Payer: Self-pay

## 2013-08-28 ENCOUNTER — Other Ambulatory Visit: Payer: Self-pay | Admitting: Nurse Practitioner

## 2013-08-28 MED ORDER — AMLODIPINE BESYLATE 5 MG PO TABS: ORAL_TABLET | ORAL | Status: DC

## 2013-10-19 ENCOUNTER — Other Ambulatory Visit: Payer: Self-pay

## 2013-10-19 ENCOUNTER — Other Ambulatory Visit: Payer: Self-pay | Admitting: Nurse Practitioner

## 2013-10-19 DIAGNOSIS — E785 Hyperlipidemia, unspecified: Secondary | ICD-10-CM

## 2013-10-19 MED ORDER — HYDROCHLOROTHIAZIDE 12.5 MG PO CAPS: ORAL_CAPSULE | ORAL | Status: DC

## 2013-10-19 MED ORDER — QUINAPRIL HCL 40 MG PO TABS: ORAL_TABLET | ORAL | Status: DC

## 2013-10-19 MED ORDER — AMLODIPINE BESYLATE 5 MG PO TABS: ORAL_TABLET | ORAL | Status: DC

## 2013-10-19 MED ORDER — ATORVASTATIN CALCIUM 10 MG PO TABS: ORAL_TABLET | ORAL | Status: DC

## 2013-11-18 ENCOUNTER — Other Ambulatory Visit: Payer: Self-pay | Admitting: Cardiology

## 2013-11-18 ENCOUNTER — Other Ambulatory Visit: Payer: Self-pay | Admitting: Nurse Practitioner

## 2013-11-24 ENCOUNTER — Other Ambulatory Visit: Payer: Self-pay | Admitting: Nurse Practitioner

## 2013-11-24 ENCOUNTER — Other Ambulatory Visit: Payer: Self-pay | Admitting: Cardiology

## 2013-12-03 ENCOUNTER — Other Ambulatory Visit: Payer: Self-pay

## 2013-12-03 DIAGNOSIS — E785 Hyperlipidemia, unspecified: Secondary | ICD-10-CM

## 2013-12-03 MED ORDER — ATORVASTATIN CALCIUM 10 MG PO TABS: ORAL_TABLET | ORAL | Status: DC

## 2013-12-03 MED ORDER — HYDROCHLOROTHIAZIDE 12.5 MG PO CAPS: ORAL_CAPSULE | ORAL | Status: DC

## 2013-12-03 MED ORDER — QUINAPRIL HCL 40 MG PO TABS: ORAL_TABLET | ORAL | Status: DC

## 2013-12-29 ENCOUNTER — Telehealth: Payer: Self-pay | Admitting: Nurse Practitioner

## 2013-12-29 NOTE — Telephone Encounter (Signed)
New message     Patient calling would like to have lab work drawn today  For upcoming appt on Friday

## 2013-12-30 ENCOUNTER — Ambulatory Visit: Payer: BC Managed Care – PPO | Admitting: Nurse Practitioner

## 2014-01-01 ENCOUNTER — Encounter: Payer: Self-pay | Admitting: Nurse Practitioner

## 2014-01-01 ENCOUNTER — Ambulatory Visit (INDEPENDENT_AMBULATORY_CARE_PROVIDER_SITE_OTHER): Payer: No Typology Code available for payment source | Admitting: Nurse Practitioner

## 2014-01-01 VITALS — BP 118/84 | HR 91 | Ht 69.5 in | Wt 177.8 lb

## 2014-01-01 DIAGNOSIS — E785 Hyperlipidemia, unspecified: Secondary | ICD-10-CM

## 2014-01-01 DIAGNOSIS — I10 Essential (primary) hypertension: Secondary | ICD-10-CM

## 2014-01-01 MED ORDER — ATORVASTATIN CALCIUM 10 MG PO TABS: ORAL_TABLET | ORAL | Status: DC

## 2014-01-01 MED ORDER — HYDROCHLOROTHIAZIDE 12.5 MG PO CAPS: ORAL_CAPSULE | ORAL | Status: DC

## 2014-01-01 MED ORDER — AMLODIPINE BESYLATE 5 MG PO TABS: ORAL_TABLET | ORAL | Status: DC

## 2014-01-01 MED ORDER — QUINAPRIL HCL 40 MG PO TABS: ORAL_TABLET | ORAL | Status: DC

## 2014-01-01 NOTE — Patient Instructions (Addendum)
Stay on your current medicines  Stay active  Check fasting labs next Thursday  See me in one year  The phone number for [facility] is [phone]  Call the [place] office at [phone] if you have any questions, problems or concerns.

## 2014-01-01 NOTE — Progress Notes (Signed)
Nicholas Paul Date of Birth: October 17, 1950 Medical Record #[id number]  History of Present Illness: Nicholas Paul is seen back today for a follow up visit. This is a one year check. Seen for Dr. Mare Ferrari. Has had prior coronary CT showing soft plaque in the RCA in 2012 and a calcium score of 0. Has HTN and HLD. Has had a past lung nodule noted(4 mm) as well by CT in 2012 - but with no risk factors, no further follow up felt to be needed per radiology and after discussion with Dr. Mare Ferrari.   Last seen in August of 2014. Was doing well. Restarted his statin therapy.   Comes back today. Here alone. He is quite jovial. Doing well. No chest pain. Not short of breath.  Feels good. Has not been taking his Lipitor regularly - has missed some doses due to recent vacation, etc. He has not been as active due to some knee issues - may need arthroscopy. Not fasting today. He is happy with how he is doing. His BP is great at home.    Current Outpatient Prescriptions  Medication Sig Dispense Refill  . amLODipine (NORVASC) 5 MG tablet TAKE 1 TABLET BY MOUTH ONCE DAILY  90 tablet  3  . aspirin 81 MG tablet Take 81 mg by mouth daily.        Marland Kitchen atorvastatin (LIPITOR) 10 MG tablet Take Lipitor 10 mg by mouth on Mondays, Wednesdays, and Fridays only.  45 tablet  3  . Coenzyme Q10 (CO Q 10 PO) Take 1 capsule by mouth daily.      . famotidine (PEPCID AC) 10 MG chewable tablet Chew 10 mg by mouth as needed. For heartburn.      . hydrochlorothiazide (MICROZIDE) 12.5 MG capsule TAKE 1 CAPSULE BY MOUTH ONCE DAILY  90 capsule  3  . Omega-3 Fatty Acids (OMEGA 3 PO) Take by mouth daily.      . psyllium (METAMUCIL) 58.6 % packet Take 2 packets by mouth 2 (two) times daily.       . quinapril (ACCUPRIL) 40 MG tablet TAKE 1 TABLET BY MOUTH ONCE DAILY  90 tablet  3   No current facility-administered medications for this visit.    Allergies  Allergen Reactions  . Doxazosin Mesylate     REACTION: edema on the generic     Past Medical History  Diagnosis Date  . Hyperlipidemia   . Hematuria, microscopic   . Hypertension   . Prostatitis   . Chest pain     ER visit 12/12 with coronary CT showing 50% soft stenosis in the RCA with otherwise patent vessels and calcium score of 0  . Pulmonary nodule     4 mm RML nodule on CT scan in 12/12. Needs follow up in one year.   . H/O: rheumatic fever     noted after tonsillectomy  . Adenomatous colon polyp   . Gilbert's syndrome   . Internal hemorrhoids   . IBS (irritable bowel syndrome)     Dr Carlean Purl  . GERD (gastroesophageal reflux disease)   . Coronary artery disease     soft plaque with 50% narrowing R mid coronary artery    Past Surgical History  Procedure Laterality Date  . Vasectomy    . Cystoscopy      X4 , Dr Terance Hart; last 2011  . Tonsillectomy and adenoidectomy    . Colonoscopy w/ polypectomy  12/06/2009    internal hemorrhoids    History  Smoking status  .  Never Smoker   Smokeless tobacco  . Not on file    History  Alcohol Use  . 7.2 oz/week  . 5 Glasses of wine, 7 Cans of beer per week    Family History  Problem Relation Age of Onset  . Cancer Father     bladder cancer  . Cancer Brother     bladder cancer  . Hypertension Brother   . Cancer Paternal Aunt     bladder cancer  . Hyperlipidemia Mother   . Hypertension Mother   . Stroke Neg Hx   . Diabetes Neg Hx     Review of Systems: The review of systems is per the HPI.  All other systems were reviewed and are negative.  Physical Exam: BP 118/84  Pulse 91  Ht 5' 9.5" (1.765 m)  Wt 177 lb 12.8 oz (80.65 kg)  BMI 25.89 kg/m2  SpO2 95% Patient is very pleasant and in no acute distress. Skin is warm and dry. Color is normal.  HEENT is unremarkable. Normocephalic/atraumatic. PERRL. Sclera are nonicteric. Neck is supple. No masses. No JVD. Lungs are clear. Cardiac exam shows a regular rate and rhythm. Abdomen is soft. Extremities are without edema. Gait and ROM are  intact. No gross neurologic deficits noted.  Wt Readings from Last 3 Encounters:  01/01/14 177 lb 12.8 oz (80.65 kg)  12/31/12 174 lb 6.4 oz (79.107 kg)  03/10/12 176 lb (79.833 kg)    LABORATORY DATA/PROCEDURES:  Lab Results  Component Value Date   WBC 6.1 12/31/2012   HGB 15.2 12/31/2012   HCT 43.6 12/31/2012   PLT 193.0 12/31/2012   GLUCOSE 103* 12/31/2012   CHOL 181 12/31/2012   TRIG 72.0 12/31/2012   HDL 53.20 12/31/2012   LDLCALC 113* 12/31/2012   ALT 18 12/31/2012   AST 20 12/31/2012   NA 135 12/31/2012   K 4.5 12/31/2012   CL 101 12/31/2012   CREATININE 0.9 12/31/2012   BUN 11 12/31/2012   CO2 31 12/31/2012   TSH 0.56 03/03/2012   PSA 1.96 12/28/2010    BNP (last 3 results) No results found for this basename: PROBNP,  in the last 8760 hours   Assessment / Plan: 1. Coronary plaque on past CT - managed with CV risk factor modification.   2. HTN - BP is doing well. No change in current therapy. I have refilled his medicine today.   3. HLD -  Needs fasting labs. Arranged for next Thursday per his request.   See back in a year. Discussed PCP today.   Patient is agreeable to this plan and will call if any problems develop in the interim.   Burtis Junes, RN, [place] [address] [place], [place]  [address] [phone]

## 2014-01-07 ENCOUNTER — Encounter: Payer: Self-pay | Admitting: Nurse Practitioner

## 2014-01-07 ENCOUNTER — Other Ambulatory Visit (INDEPENDENT_AMBULATORY_CARE_PROVIDER_SITE_OTHER): Payer: No Typology Code available for payment source

## 2014-01-07 DIAGNOSIS — I10 Essential (primary) hypertension: Secondary | ICD-10-CM

## 2014-01-07 DIAGNOSIS — E785 Hyperlipidemia, unspecified: Secondary | ICD-10-CM

## 2014-01-07 LAB — LIPID PANEL
Cholesterol: 159 mg/dL (ref 0–200)
HDL: 56.9 mg/dL (ref >39.00)
LDL Cholesterol: 81 mg/dL (ref 0–99)
NonHDL: 102.1
Total CHOL/HDL Ratio: 3
Triglycerides: 104 mg/dL (ref 0.0–149.0)
VLDL: 20.8 mg/dL (ref 0.0–40.0)

## 2014-01-07 LAB — HEPATIC FUNCTION PANEL
ALT: 22 U/L (ref 0–53)
AST: 20 U/L (ref 0–37)
Albumin: 4.1 g/dL (ref 3.5–5.2)
Alkaline Phosphatase: 68 U/L (ref 39–117)
Bilirubin, Direct: 0.1 mg/dL (ref 0.0–0.3)
Total Bilirubin: 1.7 mg/dL — ABNORMAL HIGH (ref 0.2–1.2)
Total Protein: 7.5 g/dL (ref 6.0–8.3)

## 2014-01-07 LAB — BASIC METABOLIC PANEL
BUN: 8 mg/dL (ref 6–23)
CO2: 30 mEq/L (ref 19–32)
Calcium: 9.2 mg/dL (ref 8.4–10.5)
Chloride: 100 mEq/L (ref 96–112)
Creatinine, Ser: 1 mg/dL (ref 0.4–1.5)
GFR: 79.91 mL/min (ref >60.00)
Glucose, Bld: 101 mg/dL — ABNORMAL HIGH (ref 70–99)
Potassium: 3.9 mEq/L (ref 3.5–5.1)
Sodium: 136 mEq/L (ref 135–145)

## 2014-05-14 ENCOUNTER — Other Ambulatory Visit: Payer: Self-pay | Admitting: Nurse Practitioner

## 2014-05-15 ENCOUNTER — Other Ambulatory Visit: Payer: Self-pay | Admitting: Cardiology

## 2014-05-21 ENCOUNTER — Ambulatory Visit (INDEPENDENT_AMBULATORY_CARE_PROVIDER_SITE_OTHER): Payer: No Typology Code available for payment source | Admitting: Medical

## 2014-05-21 ENCOUNTER — Encounter: Payer: Self-pay | Admitting: Medical

## 2014-05-21 ENCOUNTER — Ambulatory Visit: Payer: No Typology Code available for payment source | Admitting: Internal Medicine

## 2014-05-21 VITALS — BP 139/77 | HR 83 | Temp 97.8°F | Ht 69.5 in | Wt 181.2 lb

## 2014-05-21 DIAGNOSIS — J0100 Acute maxillary sinusitis, unspecified: Secondary | ICD-10-CM

## 2014-05-21 MED ORDER — AMOXICILLIN-POT CLAVULANATE 875-125 MG PO TABS: 1.0000 | ORAL_TABLET | Freq: Two times a day (BID) | ORAL | Status: DC

## 2014-05-21 MED ORDER — FLUTICASONE PROPIONATE 50 MCG/ACT NA SUSP: 2.0000 | Freq: Every day | NASAL | Status: DC

## 2014-05-21 MED ORDER — BENZONATATE 100 MG PO CAPS: 100.0000 mg | ORAL_CAPSULE | Freq: Three times a day (TID) | ORAL | Status: DC | PRN

## 2014-05-21 NOTE — Patient Instructions (Addendum)
Your appear to have a sinus infection. I am prescribing augmentin  antibiotic for the infection. To help with the nasal congestion I prescribed  flonase nasal steroid. For your associated cough, I prescribed cough medicine benzonatate.  Rest, hydrate, tylenol for fever.  Follow up in 7 days or as needed.  Opened to check which pharmacy sent med to.

## 2014-05-21 NOTE — Assessment & Plan Note (Signed)
Your appear to have a sinus infection. I am prescribing augmentin  antibiotic for the infection. To help with the nasal congestion I prescribed  flonase nasal steroid. For your associated cough, I prescribed cough medicine benzonatate.

## 2014-05-21 NOTE — Progress Notes (Signed)
Subjective:    Patient ID: Nicholas Paul, male    DOB: Jan 29, 1951, 64 y.o.   MRN: [id number]  HPI   Pt in today reporting  cough, nasal congestion and runny nose for 5  Days. Wife was sick first.  Otc mucinex and tylenol cold. Mild st this am and left ear faint pain. Mild sinus pressure and teeth sensiitve 3 days ago. Lt ear pain  Associated symptoms( below yes or no)  Fever-no Chills-yes Chest congestion-no Sneezing- yes Itching eyes-yes, some faint Sore throat- yes, mild. Post-nasal drainage-yes, mild Wheezing-No Purulent  Nasal drainage-yes Fatigue-mild  Non smoker.  Past Medical History  Diagnosis Date  . Hyperlipidemia   . Hematuria, microscopic   . Hypertension   . Prostatitis   . Chest pain     ER visit 12/12 with coronary CT showing 50% soft stenosis in the RCA with otherwise patent vessels and calcium score of 0  . Pulmonary nodule     4 mm RML nodule on CT scan in 12/12. Needs follow up in one year.   . H/O: rheumatic fever     noted after tonsillectomy  . Adenomatous colon polyp   . Gilbert's syndrome   . Internal hemorrhoids   . IBS (irritable bowel syndrome)     Dr Carlean Purl  . GERD (gastroesophageal reflux disease)   . Coronary artery disease     soft plaque with 50% narrowing R mid coronary artery    History   Social History  . Marital Status: Married    Spouse Name: N/A    Number of Children: 2  . Years of Education: N/A   Occupational History  .     Social History Main Topics  . Smoking status: Never Smoker   . Smokeless tobacco: Not on file  . Alcohol Use: 7.2 oz/week    5 Glasses of wine, 7 Cans of beer per week  . Drug Use: No  . Sexual Activity: Yes   Other Topics Concern  . Not on file   Social History Narrative    Past Surgical History  Procedure Laterality Date  . Vasectomy    . Cystoscopy      X4 , Dr Terance Hart; last 2011  . Tonsillectomy and adenoidectomy    . Colonoscopy w/ polypectomy  12/06/2009    internal  hemorrhoids    Family History  Problem Relation Age of Onset  . Cancer Father     bladder cancer  . Cancer Brother     bladder cancer  . Hypertension Brother   . Cancer Paternal Aunt     bladder cancer  . Hyperlipidemia Mother   . Hypertension Mother   . Stroke Neg Hx   . Diabetes Neg Hx     Allergies  Allergen Reactions  . Doxazosin Mesylate     REACTION: edema on the generic    Current Outpatient Prescriptions on File Prior to Visit  Medication Sig Dispense Refill  . amLODipine (NORVASC) 5 MG tablet TAKE 1 TABLET BY MOUTH ONCE DAILY 90 tablet 3  . aspirin 81 MG tablet Take 81 mg by mouth daily.      Marland Kitchen atorvastatin (LIPITOR) 10 MG tablet TAKE 10 MG BY MOUTH ON MONDAYS, WEDNESDAYS, AND FRIDAYS ONLY. 45 tablet 0  . Coenzyme Q10 (CO Q 10 PO) Take 1 capsule by mouth daily.    . famotidine (PEPCID AC) 10 MG chewable tablet Chew 10 mg by mouth as needed. For heartburn.    . hydrochlorothiazide (  MICROZIDE) 12.5 MG capsule TAKE 1 CAPSULE BY MOUTH ONCE DAILY 90 capsule 0  . Omega-3 Fatty Acids (OMEGA 3 PO) Take by mouth daily.    . psyllium (METAMUCIL) 58.6 % packet Take 2 packets by mouth 2 (two) times daily.     . quinapril (ACCUPRIL) 40 MG tablet TAKE 1 TABLET BY MOUTH ONCE DAILY 90 tablet 3   No current facility-administered medications on file prior to visit.    BP 139/77 mmHg  Pulse 83  Temp(Src) 97.8 F (36.6 C) (Oral)  Ht 5' 9.5" (1.765 m)  Wt 181 lb 3.2 oz (82.192 kg)  BMI 26.38 kg/m2  SpO2 99%       Review of Systems  Constitutional: Positive for chills. Negative for fever and fatigue.  HENT: Positive for ear pain, sinus pressure, sneezing and sore throat. Negative for congestion, drooling, postnasal drip and tinnitus.   Respiratory: Positive for cough. Negative for chest tightness, shortness of breath and wheezing.   Cardiovascular: Negative for chest pain and palpitations.  Musculoskeletal: Negative for back pain.  Neurological: Positive for headaches.         Faint mild ha now.  Hematological: Negative for adenopathy. Does not bruise/bleed easily.  Psychiatric/Behavioral: Negative for behavioral problems and confusion.       Objective:   Physical Exam   General  Mental Status - Alert. General Appearance - Well groomed. Not in acute distress.  Skin Rashes- No Rashes.  HEENT Head- Normal. Ear Auditory Canal - Left- Normal. Right - Normal.Tympanic Membrane- Left- mild central redness. Right- Normal. Eye Sclera/Conjunctiva- Left- Normal. Right- Normal. Nose & Sinuses Nasal Mucosa- Left-  Boggy and Congested. Right-  Boggy and  Congested.Bilateral maxillary mild pressure but  frontal sinus pressure. Mouth & Throat Lips: Upper Lip- Normal: no dryness, cracking, pallor, cyanosis, or vesicular eruption. Lower Lip-Normal: no dryness, cracking, pallor, cyanosis or vesicular eruption. Buccal Mucosa- Bilateral- No Aphthous ulcers. Oropharynx- No Discharge or Erythema. Tonsils: Characteristics- Bilateral- No Erythema or Congestion. Size/Enlargement- Bilateral- No enlargement. Discharge- bilateral-None.  Neck Neck- Supple. No Masses.   Chest and Lung Exam Auscultation: Breath Sounds:-Clear even and unlabored.  Cardiovascular Auscultation:Rythm- Regular, rate and rhythm. Murmurs & Other Heart Sounds:Ausculatation of the heart reveal- No Murmurs.  Lymphatic Head & Neck General Head & Neck Lymphatics: Bilateral: Description- No Localized lymphadenopathy.         Assessment & Plan:

## 2014-05-21 NOTE — Progress Notes (Signed)
Pre visit review using our clinic review tool, if applicable. No additional management support is needed unless otherwise documented below in the visit note. 

## 2014-05-27 ENCOUNTER — Ambulatory Visit (INDEPENDENT_AMBULATORY_CARE_PROVIDER_SITE_OTHER): Payer: No Typology Code available for payment source | Admitting: Internal Medicine

## 2014-05-27 ENCOUNTER — Encounter: Payer: Self-pay | Admitting: Internal Medicine

## 2014-05-27 VITALS — BP 130/90 | HR 86 | Temp 98.1°F | Ht 70.0 in | Wt 180.2 lb

## 2014-05-27 DIAGNOSIS — J0110 Acute frontal sinusitis, unspecified: Secondary | ICD-10-CM

## 2014-05-27 DIAGNOSIS — Z23 Encounter for immunization: Secondary | ICD-10-CM

## 2014-05-27 MED ORDER — PREDNISONE 20 MG PO TABS: 20.0000 mg | ORAL_TABLET | Freq: Two times a day (BID) | ORAL | Status: DC

## 2014-05-27 NOTE — Patient Instructions (Signed)

## 2014-05-27 NOTE — Progress Notes (Signed)
Pre visit review using our clinic review tool, if applicable. No additional management support is needed unless otherwise documented below in the visit note. 

## 2014-05-27 NOTE — Progress Notes (Signed)
   Subjective:    Patient ID: Nicholas Paul, male    DOB: [birth year], 64 y.o.   MRN: [id number]  HPI  Symptoms began 8 days ago as head congestion and nasal obstruction. He also had purulent drainage. This was associated with loss of smell.  The acute process was associated with fatigue and halitosis. He describes some pressure in the left ear greater than the right. Initially he also had dental pain.  He was seen and placed on Augmentin for 10 days. He was also given Flonase and Tessalon Perles.  The congestion & pressure in his head and ears is slightly better overnight but progresses through the day  He's had a nonproductive cough. He is not had the flu shot.  Review of Systems  He does not have associated shortness of breath or wheezing.  Fever initially present has resolved with antibiotics.  He has no extrinsic symptoms of itchy, watery eyes, sneezing.      Objective:   Physical Exam  Pertinent or positive findings include: There is marked erythema of the nares, particularly of the right nasal septum. There are diffuse iatrogenic abrasions over the face and thorax related to dermatologic procedures.   General appearance:good health ;well nourished; no acute distress or increased work of breathing is present.  No  lymphadenopathy about the head, neck, or axilla noted.  Eyes: No conjunctival inflammation or lid edema is present. There is no scleral icterus. Ears:  External ear exam shows no significant lesions or deformities.  Otoscopic examination reveals clear canals, tympanic membranes are intact bilaterally without bulging, retraction, inflammation or discharge. Nose:  External nasal examination shows no deformity or inflammation. No septal dislocation or deviation.No obstruction to airflow.  Oral exam: Dental hygiene is good; lips and gums are healthy appearing.There is no oropharyngeal erythema or exudate noted.  Neck:  No deformities, thyromegaly, masses, or  tenderness noted.   Supple with full range of motion without pain.  Heart:  Normal rate and regular rhythm. S1 and S2 normal without gallop, murmur, click, rub or other extra sounds.  Lungs:Chest clear to auscultation; no wheezes, rhonchi,rales ,or rubs present.No increased work of breathing.   Extremities:  No cyanosis, edema, or clubbing  noted  Skin: Warm & dry w/o jaundice or tenting.      Assessment & Plan:  #1 rhinosinusitis without significant bronchitis  Plan: Nasal hygiene interventions discussed. See prescription medications

## 2014-06-22 ENCOUNTER — Other Ambulatory Visit: Payer: Self-pay | Admitting: Cardiology

## 2014-08-30 ENCOUNTER — Encounter: Payer: Self-pay | Admitting: Internal Medicine

## 2014-08-30 ENCOUNTER — Other Ambulatory Visit (INDEPENDENT_AMBULATORY_CARE_PROVIDER_SITE_OTHER): Payer: No Typology Code available for payment source

## 2014-08-30 ENCOUNTER — Ambulatory Visit (INDEPENDENT_AMBULATORY_CARE_PROVIDER_SITE_OTHER): Payer: No Typology Code available for payment source | Admitting: Internal Medicine

## 2014-08-30 VITALS — BP 128/86 | HR 61 | Temp 98.0°F | Resp 15 | Ht 70.0 in | Wt 182.0 lb

## 2014-08-30 DIAGNOSIS — Z0189 Encounter for other specified special examinations: Secondary | ICD-10-CM | POA: Diagnosis not present

## 2014-08-30 DIAGNOSIS — Z0000 Encounter for general adult medical examination without abnormal findings: Secondary | ICD-10-CM

## 2014-08-30 DIAGNOSIS — M545 Low back pain, unspecified: Secondary | ICD-10-CM

## 2014-08-30 DIAGNOSIS — E785 Hyperlipidemia, unspecified: Secondary | ICD-10-CM

## 2014-08-30 DIAGNOSIS — Z860100 Personal history of colon polyps, unspecified: Secondary | ICD-10-CM

## 2014-08-30 LAB — HEPATIC FUNCTION PANEL
ALK PHOS: 73 U/L (ref 39–117)
ALT: 24 U/L (ref 0–53)
AST: 21 U/L (ref 0–37)
Albumin: 4.3 g/dL (ref 3.5–5.2)
BILIRUBIN DIRECT: 0.4 mg/dL — AB (ref 0.0–0.3)
TOTAL PROTEIN: 6.9 g/dL (ref 6.0–8.3)
Total Bilirubin: 2.1 mg/dL — ABNORMAL HIGH (ref 0.2–1.2)

## 2014-08-30 LAB — URINALYSIS
Bilirubin Urine: NEGATIVE
Ketones, ur: NEGATIVE
Leukocytes, UA: NEGATIVE
NITRITE: NEGATIVE
SPECIFIC GRAVITY, URINE: 1.02 (ref 1.000–1.030)
TOTAL PROTEIN, URINE-UPE24: NEGATIVE
URINE GLUCOSE: NEGATIVE
Urobilinogen, UA: 0.2 (ref 0.0–1.0)
pH: 5.5 (ref 5.0–8.0)

## 2014-08-30 LAB — CBC WITH DIFFERENTIAL/PLATELET
BASOS ABS: 0 10*3/uL (ref 0.0–0.1)
BASOS PCT: 0.3 % (ref 0.0–3.0)
Eosinophils Absolute: 0 10*3/uL (ref 0.0–0.7)
Eosinophils Relative: 0.4 % (ref 0.0–5.0)
HCT: 41.2 % (ref 39.0–52.0)
Hemoglobin: 14.4 g/dL (ref 13.0–17.0)
LYMPHS PCT: 28.5 % (ref 12.0–46.0)
Lymphs Abs: 1.9 10*3/uL (ref 0.7–4.0)
MCHC: 34.9 g/dL (ref 30.0–36.0)
MCV: 87 fl (ref 78.0–100.0)
Monocytes Absolute: 0.5 10*3/uL (ref 0.1–1.0)
Monocytes Relative: 6.9 % (ref 3.0–12.0)
Neutro Abs: 4.2 10*3/uL (ref 1.4–7.7)
Neutrophils Relative %: 63.9 % (ref 43.0–77.0)
PLATELETS: 196 10*3/uL (ref 150.0–400.0)
RBC: 4.73 Mil/uL (ref 4.22–5.81)
RDW: 12.7 % (ref 11.5–15.5)
WBC: 6.6 10*3/uL (ref 4.0–10.5)

## 2014-08-30 LAB — BASIC METABOLIC PANEL
BUN: 14 mg/dL (ref 6–23)
CALCIUM: 9.3 mg/dL (ref 8.4–10.5)
CO2: 29 mEq/L (ref 19–32)
Chloride: 103 mEq/L (ref 96–112)
Creatinine, Ser: 0.93 mg/dL (ref 0.40–1.50)
GFR: 86.71 mL/min (ref >60.00)
Glucose, Bld: 90 mg/dL (ref 70–99)
POTASSIUM: 4.1 meq/L (ref 3.5–5.1)
Sodium: 138 mEq/L (ref 135–145)

## 2014-08-30 LAB — TSH: TSH: 0.46 u[IU]/mL (ref 0.35–4.50)

## 2014-08-30 LAB — HEMOGLOBIN A1C: HEMOGLOBIN A1C: 5.4 % (ref 4.6–6.5)

## 2014-08-30 LAB — PSA: PSA: 2.11 ng/mL (ref 0.10–4.00)

## 2014-08-30 NOTE — Progress Notes (Signed)
Pre visit review using our clinic review tool, if applicable. No additional management support is needed unless otherwise documented below in the visit note. 

## 2014-08-30 NOTE — Progress Notes (Signed)
Subjective:    Patient ID: Nicholas Paul, male    DOB: Jul 14, 1950, 64 y.o.   MRN: [id number]  HPI He is here for a physical;acute issues include LBP for > a week.   These are symptoms he's had the past with prostatitis. He experiences nocturia 1-2 times per night but no other GU symptoms. He has been compliant with his medications; he did not tolerate Crestor 5 mg daily due to myalgias in the lower extremities. He has no symptoms at this time with 10 mg of atorvastatin Monday, Wednesday, Friday. Lipids in 8/15 were on atorvastatin for almost a year; these were at goal.  BP averages 125/82. He is on a heart healthy low-salt diet. He's exercising mainly playing golf without cardio pulmonary symptoms  He did have a negative colonoscopy 2011 but had 3 adenomas in 2006. Follow-up was recommended this year. He has no active GI symptoms except for occasional hemorrhoidal bleed.    Review of Systems   Chest pain, palpitations, tachycardia, exertional dyspnea, paroxysmal nocturnal dyspnea, claudication or edema are absent.  Unexplained weight loss, abdominal pain, significant dyspepsia, dysphagia, melena, rectal bleeding, or persistently small caliber stools are denied.  Dysuria, pyuria, hematuria, frequency, or polyuria are denied.  There is no numbness, tingling, or weakness in extremities.   No loss of control of bladder or bowels. Radicular type pain absent.      Objective:   Physical Exam Gen.: Adequately nourished in appearance. Alert, appropriate and cooperative throughout exam. BMI: 26.11 Head: Normocephalic without obvious abnormalities; pattern alopecia  Eyes: Ptosis bilaterally.No corneal or conjunctival inflammation noted. Pupils equal round reactive to light and accommodation. Extraocular motion intact.  Ears: External  ear exam reveals no significant lesions or deformities. Canals clear .TMs normal. Hearing is grossly decreased bilaterally. Nose: External nasal exam  reveals no deformity or inflammation. Nasal mucosa are pink and moist. No lesions or exudates noted.   Mouth: Oral mucosa and oropharynx reveal no lesions or exudates. Teeth in good repair. Neck: No deformities, masses, or tenderness noted. Range of motion & Thyroid normal. Lungs: Normal respiratory effort; chest expands symmetrically. Lungs are clear to auscultation without rales, wheezes, or increased work of breathing. Heart: Slow rate and regular rhythm. Normal S1 and S2. No gallop, click, or rub. No murmur. Abdomen: Bowel sounds normal; abdomen soft and nontender. No masses, organomegaly or hernias noted. Genitalia: Genitalia normal except for left varices. Prostate is asymmetric; it is 1.25-1.5 enlargement right with induration. No definite nodularity present. Musculoskeletal/extremities: No deformity or scoliosis noted of  the thoracic or lumbar spine.  No clubbing, cyanosis, edema, or significant extremity  deformity noted.  Range of motion normal . Tone & strength normal. Hand joints normal Fingernail  health good. Crepitus of knees  Able to lie down & sit up w/o help.  Negative SLR bilaterally Vascular: Carotid, radial artery, dorsalis pedis and  posterior tibial pulses are full and equal. No bruits present. Neurologic: Alert and oriented x3. Deep tendon reflexes symmetrical and normal.  Gait normal      Skin: Intact without suspicious lesions or rashes. Lymph: No cervical, axillary, or inguinal lymphadenopathy present. Psych: Mood and affect are normal. Normally interactive  Assessment & Plan:  #1 comprehensive physical exam #2 abnormal DRE  Plan: see Orders  & Recommendations

## 2014-08-30 NOTE — Patient Instructions (Signed)
  Your next office appointment will be determined based upon review of your pending labs.  Those instructions will be transmitted to you by My Chart    Critical results will be called.   Followup as needed for any active or acute issue. Please report any significant change in your symptoms. 

## 2014-10-01 ENCOUNTER — Other Ambulatory Visit: Payer: Self-pay | Admitting: Nurse Practitioner

## 2014-10-07 ENCOUNTER — Encounter: Payer: Self-pay | Admitting: Nurse Practitioner

## 2014-10-28 ENCOUNTER — Other Ambulatory Visit: Payer: Self-pay | Admitting: Nurse Practitioner

## 2014-11-16 DIAGNOSIS — S70361A Insect bite (nonvenomous), right thigh, initial encounter: Secondary | ICD-10-CM | POA: Diagnosis not present

## 2014-12-03 ENCOUNTER — Other Ambulatory Visit: Payer: Self-pay | Admitting: Nurse Practitioner

## 2014-12-31 ENCOUNTER — Other Ambulatory Visit: Payer: Medicare Other

## 2015-01-03 ENCOUNTER — Ambulatory Visit: Payer: No Typology Code available for payment source | Admitting: Nurse Practitioner

## 2015-01-04 ENCOUNTER — Other Ambulatory Visit: Payer: Medicare Other

## 2015-01-04 ENCOUNTER — Ambulatory Visit (INDEPENDENT_AMBULATORY_CARE_PROVIDER_SITE_OTHER): Payer: Medicare Other | Admitting: Nurse Practitioner

## 2015-01-04 ENCOUNTER — Encounter: Payer: Self-pay | Admitting: Nurse Practitioner

## 2015-01-04 VITALS — BP 130/90 | HR 71 | Ht 69.5 in | Wt 180.0 lb

## 2015-01-04 DIAGNOSIS — I10 Essential (primary) hypertension: Secondary | ICD-10-CM

## 2015-01-04 DIAGNOSIS — E785 Hyperlipidemia, unspecified: Secondary | ICD-10-CM | POA: Diagnosis not present

## 2015-01-04 LAB — LIPID PANEL
Cholesterol: 148 mg/dL (ref 0–200)
HDL: 53.7 mg/dL (ref >39.00)
LDL Cholesterol: 80 mg/dL (ref 0–99)
NonHDL: 94.01
Total CHOL/HDL Ratio: 3
Triglycerides: 70 mg/dL (ref 0.0–149.0)
VLDL: 14 mg/dL (ref 0.0–40.0)

## 2015-01-04 LAB — HEPATIC FUNCTION PANEL
ALT: 21 U/L (ref 0–53)
AST: 18 U/L (ref 0–37)
Albumin: 4.1 g/dL (ref 3.5–5.2)
Alkaline Phosphatase: 65 U/L (ref 39–117)
Bilirubin, Direct: 0.2 mg/dL (ref 0.0–0.3)
Total Bilirubin: 1.2 mg/dL (ref 0.2–1.2)
Total Protein: 7 g/dL (ref 6.0–8.3)

## 2015-01-04 LAB — BASIC METABOLIC PANEL
BUN: 12 mg/dL (ref 6–23)
CO2: 29 mEq/L (ref 19–32)
Calcium: 9.2 mg/dL (ref 8.4–10.5)
Chloride: 103 mEq/L (ref 96–112)
Creatinine, Ser: 0.9 mg/dL (ref 0.40–1.50)
GFR: 89.96 mL/min (ref >60.00)
Glucose, Bld: 110 mg/dL — ABNORMAL HIGH (ref 70–99)
Potassium: 3.9 mEq/L (ref 3.5–5.1)
Sodium: 138 mEq/L (ref 135–145)

## 2015-01-04 MED ORDER — QUINAPRIL HCL 40 MG PO TABS: ORAL_TABLET | ORAL | Status: DC

## 2015-01-04 MED ORDER — ATORVASTATIN CALCIUM 10 MG PO TABS: ORAL_TABLET | ORAL | Status: DC

## 2015-01-04 MED ORDER — AMLODIPINE BESYLATE 5 MG PO TABS: 5.0000 mg | ORAL_TABLET | Freq: Every day | ORAL | Status: DC

## 2015-01-04 MED ORDER — HYDROCHLOROTHIAZIDE 12.5 MG PO CAPS: 12.5000 mg | ORAL_CAPSULE | Freq: Every day | ORAL | Status: DC

## 2015-01-04 NOTE — Patient Instructions (Addendum)
We will be checking the following labs today - BMET, Lipids and HPF   Medication Instructions:    Continue with your current medicines.   I have refilled your medicines today    Testing/Procedures To Be Arranged:  N/A  Follow-Up:   See me in one year   Other Special Instructions:   N/A  Call the [place] office at [phone] if you have any questions, problems or concerns.

## 2015-01-04 NOTE — Progress Notes (Signed)
CARDIOLOGY OFFICE NOTE  Date:  01/04/2015    Nicholas Paul Date of Birth: 04-10-1951 Medical Record #[id number]  PCP:  Unice Cobble, MD  Cardiologist:  Mare Ferrari    Chief Complaint  Patient presents with  . Annual follow up for HTN/HLD    Seen for Dr. Mare Ferrari    History of Present Illness: Nicholas Paul is a 64 y.o. male who presents today for a follow up visit. This is a one year check. Seen for Dr. Mare Ferrari. Has had prior coronary CT showing soft plaque in the RCA in 2012 and a calcium score of 0. Has HTN and HLD. Has had a past lung nodule noted(4 mm) as well by CT in 2012 - but with no risk factors, no further follow up felt to be needed per radiology and after discussion with Dr. Mare Ferrari.   Last seen in August of 2015. Was doing well. Was not taking his statin regularly.   Comes back today. Here alone. Doing well. Remains pretty jovial. No chest pain. Not short of breath. Had a reaction from a deer tick and was treated with antibiotics. Continues to be active. Tolerating his medicines. BP at home is good.   Past Medical History  Diagnosis Date  . Hyperlipidemia   . Hematuria, microscopic   . Hypertension   . Prostatitis   . Chest pain     ER visit 12/12 with coronary CT showing 50% soft stenosis in the RCA with otherwise patent vessels and calcium score of 0  . Pulmonary nodule     4 mm RML nodule on CT scan in 12/12. Needs follow up in one year.   . H/O: rheumatic fever     noted after tonsillectomy  . Adenomatous colon polyp 2006                                                                                                      X3 , all < 2mm  . Gilbert's syndrome   . Internal hemorrhoids   . IBS (irritable bowel syndrome)     Dr Carlean Purl  . GERD (gastroesophageal reflux disease)   . Coronary artery disease     soft plaque with 50% narrowing R mid coronary artery    Past Surgical History  Procedure Laterality Date  . Vasectomy    .  Cystoscopy      X4 , Dr Terance Hart; last 2011  . Tonsillectomy and adenoidectomy    . Colonoscopy w/ polypectomy  2006    internal hemorrhoids 2011     Medications: Current Outpatient Prescriptions  Medication Sig Dispense Refill  . amLODipine (NORVASC) 5 MG tablet Take 1 tablet (5 mg total) by mouth daily. 90 tablet 3  . aspirin 81 MG tablet Take 81 mg by mouth daily.      Marland Kitchen atorvastatin (LIPITOR) 10 MG tablet TAKE 10 MG BY MOUTH ON MONDAYS, WEDNESDAYS, AND FRIDAYS ONLY. 45 tablet 3  . Coenzyme Q10 (CO Q 10 PO) Take 1 capsule by mouth daily.    . famotidine (PEPCID AC) 10 MG  chewable tablet Chew 10 mg by mouth as needed. For heartburn.    . hydrochlorothiazide (MICROZIDE) 12.5 MG capsule Take 1 capsule (12.5 mg total) by mouth daily. 90 capsule 3  . Omega-3 Fatty Acids (OMEGA 3 PO) Take by mouth daily.    . psyllium (METAMUCIL) 58.6 % packet Take 2 packets by mouth 2 (two) times daily.     . quinapril (ACCUPRIL) 40 MG tablet TAKE 1 TABLET BY MOUTH ONCE DAILY 90 tablet 3   No current facility-administered medications for this visit.    Allergies: Allergies  Allergen Reactions  . Doxazosin Mesylate     REACTION: edema on the generic  . Crestor [Rosuvastatin]     Crestor 5 mg qd caused leg myalgias ; no issue with Lipitor 10 mg M, W, & F    Social History: The patient  reports that he has never smoked. He does not have any smokeless tobacco history on file. He reports that he drinks about 7.2 oz of alcohol per week. He reports that he does not use illicit drugs.   Family History: The patient's family history includes Cancer in his brother, father, and paternal aunt; Hyperlipidemia in his mother; Hypertension in his brother and mother. There is no history of Stroke, Diabetes, or Heart attack.   Review of Systems: Please see the history of present illness.   Otherwise, the review of systems is positive for none.   All other systems are reviewed and negative.   Physical Exam: VS:   BP 130/90 mmHg  Pulse 71  Ht 5' 9.5" (1.765 m)  Wt 180 lb (81.647 kg)  BMI 26.21 kg/m2 .  BMI Body mass index is 26.21 kg/(m^2).  Wt Readings from Last 3 Encounters:  01/04/15 180 lb (81.647 kg)  08/30/14 182 lb (82.555 kg)  05/27/14 180 lb 4 oz (81.761 kg)    General: Pleasant. Well developed, well nourished and in no acute distress.  HEENT: Normal. Neck: Supple, no JVD, carotid bruits, or masses noted.  Cardiac: Regular rate and rhythm. No murmurs, rubs, or gallops. No edema.  Respiratory:  Lungs are clear to auscultation bilaterally with normal work of breathing.  GI: Soft and nontender.  MS: No deformity or atrophy. Gait and ROM intact. Skin: Warm and dry. Color is normal.  Neuro:  Strength and sensation are intact and no gross focal deficits noted.  Psych: Alert, appropriate and with normal affect.   LABORATORY DATA:  EKG:  EKG is ordered today. This demonstrates NSR.  Lab Results  Component Value Date   WBC 6.6 08/30/2014   HGB 14.4 08/30/2014   HCT 41.2 08/30/2014   PLT 196.0 08/30/2014   GLUCOSE 90 08/30/2014   CHOL 159 01/07/2014   TRIG 104.0 01/07/2014   HDL 56.90 01/07/2014   LDLCALC 81 01/07/2014   ALT 24 08/30/2014   AST 21 08/30/2014   NA 138 08/30/2014   K 4.1 08/30/2014   CL 103 08/30/2014   CREATININE 0.93 08/30/2014   BUN 14 08/30/2014   CO2 29 08/30/2014   TSH 0.46 08/30/2014   PSA 2.11 08/30/2014   HGBA1C 5.4 08/30/2014    BNP (last 3 results) No results for input(s): BNP in the last 8760 hours.  ProBNP (last 3 results) No results for input(s): PROBNP in the last 8760 hours.   Other Studies Reviewed Today:  Echo Study Conclusions from 2013  - Left ventricle: The cavity size was normal. Wall thickness was increased in a pattern of mild LVH. Systolic function  was normal. The estimated ejection fraction was in the range of 55% to 60%. Wall motion was normal; there were no regional wall motion abnormalities. Doppler parameters  are consistent with abnormal left ventricular relaxation (grade 1 diastolic dysfunction). - Atrial septum: No defect or patent foramen ovale was identified.   Assessment/Plan: 1. Coronary plaque on past CT - managed with CV risk factor modification.   2. HTN - BP is doing well. Good outpatient control. No change in current therapy. I have refilled his medicine today.   3. HLD - Needs fasting labs. Checking today.   Current medicines are reviewed with the patient today.  The patient does not have concerns regarding medicines other than what has been noted above.  The following changes have been made:  See above.  Labs/ tests ordered today include:    Orders Placed This Encounter  Procedures  . Basic metabolic panel  . Hepatic function panel  . Lipid panel  . EKG 12-Lead     Disposition:   FU with me in 1 year.   Patient is agreeable to this plan and will call if any problems develop in the interim.   Signed: Burtis Junes, RN, ANP-C 01/04/2015 9:27 AM  [facility] [address] [place], [place]  [address] Phone: [phone] Fax: [phone]

## 2015-01-18 ENCOUNTER — Encounter: Payer: Self-pay | Admitting: Internal Medicine

## 2015-01-20 ENCOUNTER — Encounter: Payer: Self-pay | Admitting: *Deleted

## 2015-04-26 ENCOUNTER — Telehealth: Payer: Self-pay

## 2015-04-26 NOTE — Telephone Encounter (Signed)
Left Voice Mail for pt to call back.   RE: Flu Vaccine for 2016  

## 2015-06-28 ENCOUNTER — Ambulatory Visit (INDEPENDENT_AMBULATORY_CARE_PROVIDER_SITE_OTHER): Payer: Medicare Other | Admitting: Internal Medicine

## 2015-06-28 ENCOUNTER — Encounter: Payer: Self-pay | Admitting: Internal Medicine

## 2015-06-28 VITALS — BP 138/84 | HR 71 | Temp 98.8°F | Ht 69.5 in | Wt 182.0 lb

## 2015-06-28 DIAGNOSIS — J019 Acute sinusitis, unspecified: Secondary | ICD-10-CM

## 2015-06-28 DIAGNOSIS — I10 Essential (primary) hypertension: Secondary | ICD-10-CM

## 2015-06-28 MED ORDER — AMOXICILLIN-POT CLAVULANATE 875-125 MG PO TABS: 1.0000 | ORAL_TABLET | Freq: Two times a day (BID) | ORAL | Status: DC

## 2015-06-28 NOTE — Patient Instructions (Addendum)
Please take all new medication as prescribed - the antibiotic  You can also take Delsym OTC for cough, and/or Mucinex (or it's generic off brand) for congestion, and tylenol as needed for pain.  Please continue all other medications as before, and refills have been done if requested.  Please have the pharmacy call with any other refills you may need.  Please keep your appointments with your specialists as you may have planned    

## 2015-06-28 NOTE — Assessment & Plan Note (Signed)
Mild to mod, for antibx course,  to f/u any worsening symptoms or concerns 

## 2015-06-28 NOTE — Progress Notes (Signed)
Pre visit review using our clinic review tool, if applicable. No additional management support is needed unless otherwise documented below in the visit note. 

## 2015-06-28 NOTE — Progress Notes (Signed)
Subjective:    Patient ID: Nicholas Paul, male    DOB: 10-Mar-1951, 65 y.o.   MRN: [id number]  HPI   Here with 2-3 days acute onset fever, facial pain, pressure, headache, general weakness and malaise, and greenish d/c, with mild ST and cough, but pt denies chest pain, wheezing, increased sob or doe, orthopnea, PND, increased LE swelling, palpitations, dizziness or syncope.  Is s/p left shoulder cortisone per ortho, as well as mobic qd prn.  Pt denies new neurological symptoms such as new headache, or facial or extremity weakness or numbness   Pt denies polydipsia, polyuria, Due for flu shot but wants to return for nurse visit tomorrow Past Medical History  Diagnosis Date  . Hyperlipidemia   . Hematuria, microscopic   . Hypertension   . Prostatitis   . Chest pain     ER visit 12/12 with coronary CT showing 50% soft stenosis in the RCA with otherwise patent vessels and calcium score of 0  . Pulmonary nodule     4 mm RML nodule on CT scan in 12/12. Needs follow up in one year.   . H/O: rheumatic fever     noted after tonsillectomy  . Adenomatous colon polyp 2006                                                                                                      X3 , all < 74mm  . Gilbert's syndrome   . Internal hemorrhoids   . IBS (irritable bowel syndrome)     Dr Carlean Purl  . GERD (gastroesophageal reflux disease)   . Coronary artery disease     soft plaque with 50% narrowing R mid coronary artery   Past Surgical History  Procedure Laterality Date  . Vasectomy    . Cystoscopy      X4 , Dr Terance Hart; last 2011  . Tonsillectomy and adenoidectomy    . Colonoscopy w/ polypectomy  2006    internal hemorrhoids 2011    reports that he has never smoked. He does not have any smokeless tobacco history on file. He reports that he drinks about 7.2 oz of alcohol per week. He reports that he does not use illicit drugs. family history includes Cancer in his brother, father, and paternal aunt;  Hyperlipidemia in his mother; Hypertension in his brother and mother. There is no history of Stroke, Diabetes, or Heart attack. Allergies  Allergen Reactions  . Doxazosin Mesylate     REACTION: edema on the generic  . Crestor [Rosuvastatin]     Crestor 5 mg qd caused leg myalgias ; no issue with Lipitor 10 mg M, W, & F   Current Outpatient Prescriptions on File Prior to Visit  Medication Sig Dispense Refill  . amLODipine (NORVASC) 5 MG tablet Take 1 tablet (5 mg total) by mouth daily. 90 tablet 3  . aspirin 81 MG tablet Take 81 mg by mouth daily.      Marland Kitchen atorvastatin (LIPITOR) 10 MG tablet TAKE 10 MG BY MOUTH ON MONDAYS, WEDNESDAYS, AND FRIDAYS ONLY. 45 tablet 3  .  Coenzyme Q10 (CO Q 10 PO) Take 1 capsule by mouth daily.    . famotidine (PEPCID AC) 10 MG chewable tablet Chew 10 mg by mouth as needed. For heartburn.    . hydrochlorothiazide (MICROZIDE) 12.5 MG capsule Take 1 capsule (12.5 mg total) by mouth daily. 90 capsule 3  . Omega-3 Fatty Acids (OMEGA 3 PO) Take by mouth daily.    . psyllium (METAMUCIL) 58.6 % packet Take 2 packets by mouth 2 (two) times daily.     . quinapril (ACCUPRIL) 40 MG tablet TAKE 1 TABLET BY MOUTH ONCE DAILY 90 tablet 3   No current facility-administered medications on file prior to visit.   Review of Systems  Constitutional: Negative for unusual diaphoresis or night sweats HENT: Negative for ringing in ear or discharge Eyes: Negative for double vision or worsening visual disturbance.  Respiratory: Negative for choking and stridor.   Gastrointestinal: Negative for vomiting or other signifcant bowel change Genitourinary: Negative for hematuria or change in urine volume.  Musculoskeletal: Negative for other MSK pain or swelling Skin: Negative for color change and worsening wound.  Neurological: Negative for tremors and numbness other than noted  Psychiatric/Behavioral: Negative for decreased concentration or agitation other than above       Objective:    Physical Exam BP 138/84 mmHg  Pulse 71  Temp(Src) 98.8 F (37.1 C) (Oral)  Ht 5' 9.5" (1.765 m)  Wt 182 lb (82.555 kg)  BMI 26.50 kg/m2  SpO2 96% VS noted, mild ill Constitutional: Pt appears in no significant distress HENT: Head: NCAT.  Right Ear: External ear normal.  Left Ear: External ear normal.  Eyes: . Pupils are equal, round, and reactive to light. Conjunctivae and EOM are normal Bilat tm's with mild erythema.  Max sinus areas mild tender.  Pharynx with mild erythema, no exudate Neck: Normal range of motion. Neck supple.  Cardiovascular: Normal rate and regular rhythm.   Pulmonary/Chest: Effort normal and breath sounds without rales or wheezing.  Neurological: Pt is alert. Not confused , motor grossly intact Skin: Skin is warm. No rash, no LE edema Psychiatric: Pt behavior is normal. No agitation.     Assessment & Plan:

## 2015-06-28 NOTE — Assessment & Plan Note (Signed)
stable overall by history and exam, recent data reviewed with pt, and pt to continue medical treatment as before,  to f/u any worsening symptoms or concerns BP Readings from Last 3 Encounters:  06/28/15 138/84  01/04/15 130/90  08/30/14 128/86

## 2015-07-29 ENCOUNTER — Telehealth: Payer: Self-pay

## 2015-07-29 NOTE — Telephone Encounter (Signed)
LVM for pt to call back as soon as possible.   RE: Flu Vaccine 2016-2017

## 2015-08-01 ENCOUNTER — Other Ambulatory Visit: Payer: Self-pay | Admitting: Cardiology

## 2015-08-31 ENCOUNTER — Other Ambulatory Visit: Payer: Self-pay | Admitting: *Deleted

## 2015-08-31 MED ORDER — HYDROCHLOROTHIAZIDE 12.5 MG PO CAPS: 12.5000 mg | ORAL_CAPSULE | Freq: Every day | ORAL | Status: DC

## 2015-08-31 NOTE — Telephone Encounter (Signed)
hydrochlorothiazide (MICROZIDE) 12.5 MG capsule  Medication   Date: 01/04/2015  Department: [facility]  Ordering/Authorizing: Burtis Junes, NP      Order Providers    Prescribing Provider Encounter Provider   Burtis Junes, NP Burtis Junes, NP    Supervision Information    Supervising Provider Type of Supervision   Peter M Martinique, MD Incident To    Medication Detail      Disp Refills Start End     hydrochlorothiazide (MICROZIDE) 12.5 MG capsule 90 capsule 3 01/04/2015     Sig - Route: Take 1 capsule (12.5 mg total) by mouth daily. - Oral    Class: Print     Pharmacy    [id number] - [place],  - [place]. AT [address]

## 2015-09-01 ENCOUNTER — Ambulatory Visit (INDEPENDENT_AMBULATORY_CARE_PROVIDER_SITE_OTHER): Payer: Medicare Other | Admitting: Internal Medicine

## 2015-09-01 ENCOUNTER — Encounter: Payer: Self-pay | Admitting: Internal Medicine

## 2015-09-01 ENCOUNTER — Other Ambulatory Visit (INDEPENDENT_AMBULATORY_CARE_PROVIDER_SITE_OTHER): Payer: Medicare Other

## 2015-09-01 VITALS — BP 138/88 | HR 81 | Temp 98.1°F | Resp 16 | Wt 179.0 lb

## 2015-09-01 DIAGNOSIS — Z87898 Personal history of other specified conditions: Secondary | ICD-10-CM | POA: Diagnosis not present

## 2015-09-01 DIAGNOSIS — R972 Elevated prostate specific antigen [PSA]: Secondary | ICD-10-CM

## 2015-09-01 DIAGNOSIS — E785 Hyperlipidemia, unspecified: Secondary | ICD-10-CM

## 2015-09-01 DIAGNOSIS — R739 Hyperglycemia, unspecified: Secondary | ICD-10-CM

## 2015-09-01 DIAGNOSIS — Z125 Encounter for screening for malignant neoplasm of prostate: Secondary | ICD-10-CM | POA: Diagnosis not present

## 2015-09-01 DIAGNOSIS — I10 Essential (primary) hypertension: Secondary | ICD-10-CM

## 2015-09-01 DIAGNOSIS — Z0000 Encounter for general adult medical examination without abnormal findings: Secondary | ICD-10-CM

## 2015-09-01 DIAGNOSIS — K581 Irritable bowel syndrome with constipation: Secondary | ICD-10-CM

## 2015-09-01 LAB — CBC WITH DIFFERENTIAL/PLATELET
BASOS PCT: 0.7 % (ref 0.0–3.0)
Basophils Absolute: 0 10*3/uL (ref 0.0–0.1)
EOS ABS: 0 10*3/uL (ref 0.0–0.7)
EOS PCT: 0.7 % (ref 0.0–5.0)
HEMATOCRIT: 44.5 % (ref 39.0–52.0)
HEMOGLOBIN: 15 g/dL (ref 13.0–17.0)
LYMPHS PCT: 22.4 % (ref 12.0–46.0)
Lymphs Abs: 1.6 10*3/uL (ref 0.7–4.0)
MCHC: 33.8 g/dL (ref 30.0–36.0)
MCV: 89.6 fl (ref 78.0–100.0)
Monocytes Absolute: 0.5 10*3/uL (ref 0.1–1.0)
Monocytes Relative: 7.4 % (ref 3.0–12.0)
Neutro Abs: 4.9 10*3/uL (ref 1.4–7.7)
Neutrophils Relative %: 68.8 % (ref 43.0–77.0)
Platelets: 214 10*3/uL (ref 150.0–400.0)
RBC: 4.96 Mil/uL (ref 4.22–5.81)
RDW: 12.9 % (ref 11.5–15.5)
WBC: 7.1 10*3/uL (ref 4.0–10.5)

## 2015-09-01 LAB — COMPREHENSIVE METABOLIC PANEL
ALK PHOS: 62 U/L (ref 39–117)
ALT: 21 U/L (ref 0–53)
AST: 17 U/L (ref 0–37)
Albumin: 4.3 g/dL (ref 3.5–5.2)
BILIRUBIN TOTAL: 1.3 mg/dL — AB (ref 0.2–1.2)
BUN: 15 mg/dL (ref 6–23)
CALCIUM: 9.7 mg/dL (ref 8.4–10.5)
CHLORIDE: 103 meq/L (ref 96–112)
CO2: 31 mEq/L (ref 19–32)
CREATININE: 0.99 mg/dL (ref 0.40–1.50)
GFR: 80.43 mL/min (ref >60.00)
Glucose, Bld: 115 mg/dL — ABNORMAL HIGH (ref 70–99)
Potassium: 4.9 mEq/L (ref 3.5–5.1)
Sodium: 139 mEq/L (ref 135–145)
Total Protein: 7.3 g/dL (ref 6.0–8.3)

## 2015-09-01 LAB — LIPID PANEL
CHOLESTEROL: 161 mg/dL (ref 0–200)
HDL: 58.7 mg/dL (ref >39.00)
LDL CALC: 87 mg/dL (ref 0–99)
NonHDL: 101.89
TRIGLYCERIDES: 73 mg/dL (ref 0.0–149.0)
Total CHOL/HDL Ratio: 3
VLDL: 14.6 mg/dL (ref 0.0–40.0)

## 2015-09-01 LAB — TSH: TSH: 0.64 u[IU]/mL (ref 0.35–4.50)

## 2015-09-01 NOTE — Assessment & Plan Note (Signed)
Constipation controlled with metamucil Sees Dr Carlean Purl Due for colonoscopy - will schedule

## 2015-09-01 NOTE — Assessment & Plan Note (Signed)
BP well controlled Current regimen effective and well tolerated Continue current medications at current doses cmp  

## 2015-09-01 NOTE — Assessment & Plan Note (Signed)
On lipitor Check lipids

## 2015-09-01 NOTE — Progress Notes (Signed)
Pre visit review using our clinic review tool, if applicable. No additional management support is needed unless otherwise documented below in the visit note. 

## 2015-09-01 NOTE — Progress Notes (Signed)
Subjective:    Patient ID: Nicholas Paul, male    DOB: 06-19-1950, 65 y.o.   MRN: [id number]  HPI He is here to establish with a new pcp.     His BP average 134/78 at home.  He is taking all his medications daily as prescribed.   Here for medicare wellness exam.   I have personally reviewed and have noted 1.The patient's medical and social history 2.Their use of alcohol, tobacco or illicit drugs 3.Their current medications and supplements 4.The patient's functional ability including ADL's, fall risks, home safety              risks and hearing or visual impairment. 5.Diet and physical activities 6.Evidence for depression or mood disorders 7.Care team reviewed and updated cardiology - [facility]- Dr. Carlean Purl   Are there smokers in your home (other than you)? No  Risk Factors Exercise: golf once a week, stretching Dietary issues discussed: well rounded, healthy diet  Cardiac risk factors: advanced age, hypertension, hyperlipidemia  Depression Screen  Have you felt down, depressed or hopeless? No  Have you felt little interest or pleasure in doing things?  No Activities of Daily Living In your present state of health, do you have any difficulty performing the following activities?:  Driving? No Managing money?  No Feeding yourself? No Getting from bed to chair? No Climbing a flight of stairs? No Preparing food and eating?: No Bathing or showering? No Getting dressed: No Getting to/using the toilet? No Moving around from place to place: No In the past year have you fallen or had a near fall?: No   Are you sexually active?  yes  Do you have more than one partner?  N/A  Hearing Difficulties: yes, left ear Do you often ask people to speak up or repeat themselves? yes Do you experience ringing or noises in your ears? yes Do you have difficulty understanding soft or whispered voices? yes Vision:           Any change in vision: no             Up to date with eye exam:  Due for an exam Memory:  Do you feel that you have a problem with memory? No  Do you often misplace items? No  Do you feel safe at home?  Yes  Cognitive Testing  Alert, Orientated? Yes  Normal Appearance? Yes  Recall of three objects?  Yes  Can perform simple calculations? Yes  Displays appropriate judgment? Yes  Can read the correct time from a watch face? Yes   Advanced Directives have been discussed with the patient? Yes, in place  Medications and allergies reviewed with patient and updated if appropriate.  Patient Active Problem List   Diagnosis Date Noted  . Gilbert syndrome 07/30/2011  . Pulmonary nodule 05/21/2011  . ARTHRALGIA 07/14/2010  . LOW BACK PAIN SYNDROME 07/14/2010  . Irritable bowel syndrome 04/04/2010  . Actinic keratosis 10/20/2009  . History of colonic polyps 10/20/2009  . HEMATURIA, MICROSCOPIC, HX OF 10/20/2009  . Hyperlipidemia 07/31/2007  . Essential hypertension 07/31/2007    Current Outpatient Prescriptions on File Prior to Visit  Medication Sig Dispense Refill  . amLODipine (NORVASC) 5 MG tablet Take 1 tablet (5 mg total) by mouth daily. 90 tablet 3  . aspirin 81 MG tablet Take 81 mg by mouth daily.      Marland Kitchen atorvastatin (LIPITOR) 10 MG tablet TAKE 10 MG BY MOUTH ON MONDAYS, WEDNESDAYS, AND FRIDAYS ONLY.  45 tablet 1  . Coenzyme Q10 (CO Q 10 PO) Take 1 capsule by mouth daily.    . famotidine (PEPCID AC) 10 MG chewable tablet Chew 10 mg by mouth as needed. For heartburn.    . hydrochlorothiazide (MICROZIDE) 12.5 MG capsule Take 1 capsule (12.5 mg total) by mouth daily. 90 capsule 3  . Omega-3 Fatty Acids (OMEGA 3 PO) Take by mouth daily.    . psyllium (METAMUCIL) 58.6 % packet Take 2 packets by mouth 2 (two) times daily.     . quinapril (ACCUPRIL) 40 MG tablet TAKE 1 TABLET BY MOUTH ONCE DAILY 90 tablet 3   No current facility-administered medications on file prior to visit.     Past Medical History  Diagnosis Date  . Hyperlipidemia   . Hematuria, microscopic   . Hypertension   . Prostatitis   . Chest pain     ER visit 12/12 with coronary CT showing 50% soft stenosis in the RCA with otherwise patent vessels and calcium score of 0  . Pulmonary nodule     4 mm RML nodule on CT scan in 12/12. Needs follow up in one year.   . H/O: rheumatic fever     noted after tonsillectomy  . Adenomatous colon polyp 2006                                                                                                      X3 , all < 38mm  . Gilbert's syndrome   . Internal hemorrhoids   . IBS (irritable bowel syndrome)     Dr Carlean Purl  . GERD (gastroesophageal reflux disease)   . Coronary artery disease     soft plaque with 50% narrowing R mid coronary artery    Past Surgical History  Procedure Laterality Date  . Vasectomy    . Cystoscopy      X4 , Dr Terance Hart; last 2011  . Tonsillectomy and adenoidectomy    . Colonoscopy w/ polypectomy  2006    internal hemorrhoids 2011    Social History   Social History  . Marital Status: Married    Spouse Name: N/A  . Number of Children: 2  . Years of Education: N/A   Occupational History  .     Social History Main Topics  . Smoking status: Never Smoker   . Smokeless tobacco: Not on file  . Alcohol Use: 7.2 oz/week    5 Glasses of wine, 7 Cans of beer per week  . Drug Use: No  . Sexual Activity: Yes   Other Topics Concern  . Not on file   Social History Narrative    Family History  Problem Relation Age of Onset  . Cancer Father     bladder cancer  . Cancer Brother     bladder cancer  . Hypertension Brother   . Cancer Paternal Aunt     bladder cancer  . Hyperlipidemia Mother   . Hypertension Mother   . Stroke Neg Hx   . Diabetes Neg Hx   . Heart attack Neg Hx  Review of Systems  Constitutional: Negative for fever, chills, fatigue and unexpected weight change.  HENT: Positive for hearing loss  (left ear) and tinnitus.   Eyes: Negative for visual disturbance.  Respiratory: Negative for cough, shortness of breath and wheezing.   Cardiovascular: Negative for chest pain, palpitations and leg swelling.  Gastrointestinal: Positive for constipation. Negative for nausea, abdominal pain, diarrhea and blood in stool.       Occ gerd  Genitourinary: Negative for dysuria, hematuria and difficulty urinating.  Musculoskeletal: Negative for myalgias, back pain and arthralgias.  Skin: Negative for color change and rash.  Neurological: Negative for dizziness, light-headedness and headaches.  Psychiatric/Behavioral: Negative for dysphoric mood. The patient is not nervous/anxious.        Objective:   Filed Vitals:   09/01/15 0806  BP: 138/88  Pulse: 81  Temp: 98.1 F (36.7 C)  Resp: 16   Filed Weights   09/01/15 0806  Weight: 179 lb (81.194 kg)   Body mass index is 26.06 kg/(m^2).   Physical Exam Constitutional: He appears well-developed and well-nourished. No distress.  HENT:  Head: Normocephalic and atraumatic.  Right Ear: External ear normal.  Left Ear: External ear normal.  Mouth/Throat: Oropharynx is clear and moist.  Normal ear canals and TM b/l  Eyes: Conjunctivae and EOM are normal.  Neck: Neck supple. No tracheal deviation present. No thyromegaly present.  No carotid bruit  Cardiovascular: Normal rate, regular rhythm, normal heart sounds and intact distal pulses.   No murmur heard. Pulmonary/Chest: Effort normal and breath sounds normal. No respiratory distress. He has no wheezes. He has no rales.  Abdominal: Soft. Bowel sounds are normal. He exhibits no distension. There is no tenderness.  Genitourinary:  deferred  Musculoskeletal: He exhibits no edema.  Lymphadenopathy:    He has no cervical adenopathy.  Skin: Skin is warm and dry. He is not diaphoretic.  Psychiatric: He has a normal mood and affect. His behavior is normal.      Assessment & Plan:   Wellness  Exam, initial: Immunizations discussed prevnar and shingles - he will consider, encouraged him to get both.  Td up to date EKG - up to date Colonoscopy - due, he will schedule Eye exam - due, he will schedule Hearing loss - yes, left ear, considering hearing aids Memory concerns/difficulties - none, still working in a detail orientated job Independent of ADLs - fully Skin: sees derm annually Exercise: regular, weight stable Diet: healthy  See Problem List for Assessment and Plan of chronic medical problems.  F/u annually

## 2015-09-01 NOTE — Patient Instructions (Signed)
Nicholas Paul , Thank you for taking time to come for your Medicare Wellness Visit. I appreciate your ongoing commitment to your health goals. Please review the following plan we discussed and let me know if I can assist you in the future.   These are the goals we discussed: Goals    None      This is a list of the screening recommended for you and due dates:  Health Maintenance  Topic Date Due  . Colon Cancer Screening  03/17/2007  . Shingles Vaccine  10/29/2009  . Pneumonia vaccines (1 of 2 - PCV13) 09/01/2018*  . Flu Shot  12/13/2015  . Tetanus Vaccine  01/03/2021  .  Hepatitis C: One time screening is recommended by Center for Disease Control  (CDC) for  adults born from [birth year] through 1966.   Completed  . HIV Screening  Completed  *Topic was postponed. The date shown is not the original due date.    Health Maintenance, Male A healthy lifestyle and preventative care can promote health and wellness.  Maintain regular health, dental, and eye exams.  Eat a healthy diet. Foods like vegetables, fruits, whole grains, low-fat dairy products, and lean protein foods contain the nutrients you need and are low in calories. Decrease your intake of foods high in solid fats, added sugars, and salt. Get information about a proper diet from your health care provider, if necessary.  Regular physical exercise is one of the most important things you can do for your health. Most adults should get at least 150 minutes of moderate-intensity exercise (any activity that increases your heart rate and causes you to sweat) each week. In addition, most adults need muscle-strengthening exercises on 2 or more days a week.   Maintain a healthy weight. The body mass index (BMI) is a screening tool to identify possible weight problems. It provides an estimate of body fat based on height and weight. Your health care provider can find your BMI and can help you achieve or maintain a healthy weight. For males 20 years  and older:  A BMI below 18.5 is considered underweight.  A BMI of 18.5 to 24.9 is normal.  A BMI of 25 to 29.9 is considered overweight.  A BMI of 30 and above is considered obese.  Maintain normal blood lipids and cholesterol by exercising and minimizing your intake of saturated fat. Eat a balanced diet with plenty of fruits and vegetables. Blood tests for lipids and cholesterol should begin at age 5 and be repeated every 5 years. If your lipid or cholesterol levels are high, you are over age 36, or you are at high risk for heart disease, you may need your cholesterol levels checked more frequently.Ongoing high lipid and cholesterol levels should be treated with medicines if diet and exercise are not working.  If you smoke, find out from your health care provider how to quit. If you do not use tobacco, do not start.  Lung cancer screening is recommended for adults aged 46-80 years who are at high risk for developing lung cancer because of a history of smoking. A yearly low-dose CT scan of the lungs is recommended for people who have at least a 30-pack-year history of smoking and are current smokers or have quit within the past 15 years. A pack year of smoking is smoking an average of 1 pack of cigarettes a day for 1 year (for example, a 30-pack-year history of smoking could mean smoking 1 pack a day for 30  years or 2 packs a day for 15 years). Yearly screening should continue until the smoker has stopped smoking for at least 15 years. Yearly screening should be stopped for people who develop a health problem that would prevent them from having lung cancer treatment.  If you choose to drink alcohol, do not have more than 2 drinks per day. One drink is considered to be 12 oz (360 mL) of beer, 5 oz (150 mL) of wine, or 1.5 oz (45 mL) of liquor.  Avoid the use of street drugs. Do not share needles with anyone. Ask for help if you need support or instructions about stopping the use of drugs.  High  blood pressure causes heart disease and increases the risk of stroke. High blood pressure is more likely to develop in:  People who have blood pressure in the end of the normal range (100-139/85-89 mm Hg).  People who are overweight or obese.  People who are African American.  If you are 52-8 years of age, have your blood pressure checked every 3-5 years. If you are 82 years of age or older, have your blood pressure checked every year. You should have your blood pressure measured twice--once when you are at a hospital or clinic, and once when you are not at a hospital or clinic. Record the average of the two measurements. To check your blood pressure when you are not at a hospital or clinic, you can use:  An automated blood pressure machine at a pharmacy.  A home blood pressure monitor.  If you are 47-65 years old, ask your health care provider if you should take aspirin to prevent heart disease.  Diabetes screening involves taking a blood sample to check your fasting blood sugar level. This should be done once every 3 years after age 46 if you are at a normal weight and without risk factors for diabetes. Testing should be considered at a younger age or be carried out more frequently if you are overweight and have at least 1 risk factor for diabetes.  Colorectal cancer can be detected and often prevented. Most routine colorectal cancer screening begins at the age of 55 and continues through age 78. However, your health care provider may recommend screening at an earlier age if you have risk factors for colon cancer. On a yearly basis, your health care provider may provide home test kits to check for hidden blood in the stool. A small camera at the end of a tube may be used to directly examine the colon (sigmoidoscopy or colonoscopy) to detect the earliest forms of colorectal cancer. Talk to your health care provider about this at age 40 when routine screening begins. A direct exam of the colon  should be repeated every 5-10 years through age 57, unless early forms of precancerous polyps or small growths are found.  People who are at an increased risk for hepatitis B should be screened for this virus. You are considered at high risk for hepatitis B if:  You were born in a country where hepatitis B occurs often. Talk with your health care provider about which countries are considered high risk.  Your parents were born in a high-risk country and you have not received a shot to protect against hepatitis B (hepatitis B vaccine).  You have HIV or AIDS.  You use needles to inject street drugs.  You live with, or have sex with, someone who has hepatitis B.  You are a man who has sex with other  men (MSM).  You get hemodialysis treatment.  You take certain medicines for conditions like cancer, organ transplantation, and autoimmune conditions.  Hepatitis C blood testing is recommended for all people born from [birth year] through 1965 and any individual with known risk factors for hepatitis C.  Healthy men should no longer receive prostate-specific antigen (PSA) blood tests as part of routine cancer screening. Talk to your health care provider about prostate cancer screening.  Testicular cancer screening is not recommended for adolescents or adult males who have no symptoms. Screening includes self-exam, a health care provider exam, and other screening tests. Consult with your health care provider about any symptoms you have or any concerns you have about testicular cancer.  Practice safe sex. Use condoms and avoid high-risk sexual practices to reduce the spread of sexually transmitted infections (STIs).  You should be screened for STIs, including gonorrhea and chlamydia if:  You are sexually active and are younger than 24 years.  You are older than 24 years, and your health care provider tells you that you are at risk for this type of infection.  Your sexual activity has changed since you  were last screened, and you are at an increased risk for chlamydia or gonorrhea. Ask your health care provider if you are at risk.  If you are at risk of being infected with HIV, it is recommended that you take a prescription medicine daily to prevent HIV infection. This is called pre-exposure prophylaxis (PrEP). You are considered at risk if:  You are a man who has sex with other men (MSM).  You are a heterosexual man who is sexually active with multiple partners.  You take drugs by injection.  You are sexually active with a partner who has HIV.  Talk with your health care provider about whether you are at high risk of being infected with HIV. If you choose to begin PrEP, you should first be tested for HIV. You should then be tested every 3 months for as long as you are taking PrEP.  Use sunscreen. Apply sunscreen liberally and repeatedly throughout the day. You should seek shade when your shadow is shorter than you. Protect yourself by wearing long sleeves, pants, a wide-brimmed hat, and sunglasses year round whenever you are outdoors.  Tell your health care provider of new moles or changes in moles, especially if there is a change in shape or color. Also, tell your health care provider if a mole is larger than the size of a pencil eraser.  A one-time screening for abdominal aortic aneurysm (AAA) and surgical repair of large AAAs by ultrasound is recommended for men aged 11-75 years who are current or former smokers.  Stay current with your vaccines (immunizations).   This information is not intended to replace advice given to you by your health care provider. Make sure you discuss any questions you have with your health care provider.   Document Released: 10/27/2007 Document Revised: 05/21/2014 Document Reviewed: 09/25/2010 Elsevier Interactive Patient Education [employer].

## 2015-09-02 LAB — PSA, TOTAL AND FREE
PSA FREE PCT: 33 % (ref >25)
PSA FREE: 0.84 ng/mL
PSA: 2.53 ng/mL (ref <=4.00)

## 2015-09-03 ENCOUNTER — Encounter: Payer: Self-pay | Admitting: Internal Medicine

## 2015-09-03 DIAGNOSIS — R7303 Prediabetes: Secondary | ICD-10-CM | POA: Insufficient documentation

## 2015-09-03 DIAGNOSIS — R739 Hyperglycemia, unspecified: Secondary | ICD-10-CM | POA: Insufficient documentation

## 2015-09-28 ENCOUNTER — Ambulatory Visit (INDEPENDENT_AMBULATORY_CARE_PROVIDER_SITE_OTHER): Payer: Medicare Other | Admitting: Family

## 2015-09-28 ENCOUNTER — Encounter: Payer: Self-pay | Admitting: Family

## 2015-09-28 VITALS — BP 112/80 | HR 77 | Temp 98.6°F | Ht 69.5 in | Wt 178.0 lb

## 2015-09-28 DIAGNOSIS — J309 Allergic rhinitis, unspecified: Secondary | ICD-10-CM | POA: Diagnosis not present

## 2015-09-28 MED ORDER — LORATADINE 10 MG PO TABS: 10.0000 mg | ORAL_TABLET | Freq: Every day | ORAL | Status: DC

## 2015-09-28 MED ORDER — AMOXICILLIN-POT CLAVULANATE 500-125 MG PO TABS
1.0000 | ORAL_TABLET | Freq: Three times a day (TID) | ORAL | Status: DC
Start: 2015-09-28 — End: 2015-12-27

## 2015-09-28 NOTE — Progress Notes (Signed)
Pre visit review using our clinic review tool, if applicable. No additional management support is needed unless otherwise documented below in the visit note. 

## 2015-09-28 NOTE — Patient Instructions (Signed)
I suspect that your infection is viral in nature.  As discussed, I advise that you wait to fill the antibiotic after 1-2 days of symptom management to see if your symptoms improve. If you do not show improvement, you may take the antibiotic as prescribed.  Increase intake of clear fluids. Congestion is best treated by hydration, when mucus is wetter, it is thinner, less sticky, and easier to expel from the body, either through coughing up drainage, or by blowing your nose.   Get plenty of rest.   Use saline nasal drops and blow your nose frequently. Run a humidifier at night and elevate the head of the bed. Vicks Vapor rub will help with congestion and cough. Steam showers and sinus massage for congestion.   Use Acetaminophen or Ibuprofen as needed for fever or pain. Avoid second hand smoke. Even the smallest exposure will worsen symptoms.   Over the counter medications you can try include Delsym for cough, a decongestant for congestion, and Mucinex or Robitussin as an expectorant. Be sure to just get the plain Mucinex or Robitussin that just has one medication (Guaifenesen). We don't recommend the combination products. Note, be sure to drink two glasses of water with each dose of Mucinex as the medication will not work well without adequate hydration.   You can also try a teaspoon of honey to see if this will help reduce cough. Throat lozenges can sometimes be beneficial as well.    This illness will typically last 7 - 10 days.   Please follow up with our clinic if you develop a fever greater than 101 F, symptoms worsen, or do not resolve in the next week.   Allergic Rhinitis Allergic rhinitis is when the mucous membranes in the nose respond to allergens. Allergens are particles in the air that cause your body to have an allergic reaction. This causes you to release allergic antibodies. Through a chain of events, these eventually cause you to release histamine into the blood stream. Although  meant to protect the body, it is this release of histamine that causes your discomfort, such as frequent sneezing, congestion, and an itchy, runny nose.  CAUSES Seasonal allergic rhinitis (hay fever) is caused by pollen allergens that may come from grasses, trees, and weeds. Year-round allergic rhinitis (perennial allergic rhinitis) is caused by allergens such as house dust mites, pet dander, and mold spores. SYMPTOMS  Nasal stuffiness (congestion).  Itchy, runny nose with sneezing and tearing of the eyes. DIAGNOSIS Your health care provider can help you determine the allergen or allergens that trigger your symptoms. If you and your health care provider are unable to determine the allergen, skin or blood testing may be used. Your health care provider will diagnose your condition after taking your health history and performing a physical exam. Your health care provider may assess you for other related conditions, such as asthma, pink eye, or an ear infection. TREATMENT Allergic rhinitis does not have a cure, but it can be controlled by:  Medicines that block allergy symptoms. These may include allergy shots, nasal sprays, and oral antihistamines.  Avoiding the allergen. Hay fever may often be treated with antihistamines in pill or nasal spray forms. Antihistamines block the effects of histamine. There are over-the-counter medicines that may help with nasal congestion and swelling around the eyes. Check with your health care provider before taking or giving this medicine. If avoiding the allergen or the medicine prescribed do not work, there are many new medicines your health care  provider can prescribe. Stronger medicine may be used if initial measures are ineffective. Desensitizing injections can be used if medicine and avoidance does not work. Desensitization is when a patient is given ongoing shots until the body becomes less sensitive to the allergen. Make sure you follow up with your health care  provider if problems continue. HOME CARE INSTRUCTIONS It is not possible to completely avoid allergens, but you can reduce your symptoms by taking steps to limit your exposure to them. It helps to know exactly what you are allergic to so that you can avoid your specific triggers. SEEK MEDICAL CARE IF:  You have a fever.  You develop a cough that does not stop easily (persistent).  You have shortness of breath.  You start wheezing.  Symptoms interfere with normal daily activities.   This information is not intended to replace advice given to you by your health care provider. Make sure you discuss any questions you have with your health care provider.   Document Released: 01/23/2001 Document Revised: 05/21/2014 Document Reviewed: 01/05/2013 Elsevier Interactive Patient Education [employer].

## 2015-09-28 NOTE — Progress Notes (Signed)
Subjective:    Patient ID: Nicholas Paul, male    DOB: [birth year], 65 y.o.   MRN: [id number]   Nicholas Paul is a 65 y.o. male who presents today for an acute visit.    HPI Comments: Seasonal allergies. Doesn't take antihistamine.   Sinus Problem This is a new problem. The current episode started in the past 7 days. The problem is unchanged. There has been no fever. The pain is mild. Associated symptoms include congestion, coughing, ear pain (left), sinus pressure and sneezing. Pertinent negatives include no chills, headaches, shortness of breath or sore throat. Treatments tried: mucinex. The treatment provided mild relief.   Past Medical History  Diagnosis Date  . Hyperlipidemia   . Hematuria, microscopic   . Hypertension   . Prostatitis   . Chest pain     ER visit 12/12 with coronary CT showing 50% soft stenosis in the RCA with otherwise patent vessels and calcium score of 0  . Pulmonary nodule     4 mm RML nodule on CT scan in 12/12. Needs follow up in one year.   . H/O: rheumatic fever     noted after tonsillectomy  . Adenomatous colon polyp 2006                                                                                                      X3 , all < 52mm  . Gilbert's syndrome   . Internal hemorrhoids   . IBS (irritable bowel syndrome)     Dr Carlean Purl  . GERD (gastroesophageal reflux disease)   . Coronary artery disease     soft plaque with 50% narrowing R mid coronary artery   Allergies: Doxazosin mesylate and Crestor Current Outpatient Prescriptions on File Prior to Visit  Medication Sig Dispense Refill  . amLODipine (NORVASC) 5 MG tablet Take 1 tablet (5 mg total) by mouth daily. 90 tablet 3  . aspirin 81 MG tablet Take 81 mg by mouth daily.      Marland Kitchen atorvastatin (LIPITOR) 10 MG tablet TAKE 10 MG BY MOUTH ON MONDAYS, WEDNESDAYS, AND FRIDAYS ONLY. 45 tablet 1  . Coenzyme Q10 (CO Q 10 PO) Take 1 capsule by mouth daily.    . famotidine (PEPCID AC) 10 MG  chewable tablet Chew 10 mg by mouth as needed. For heartburn.    . hydrochlorothiazide (MICROZIDE) 12.5 MG capsule Take 1 capsule (12.5 mg total) by mouth daily. 90 capsule 3  . meloxicam (MOBIC) 15 MG tablet     . Omega-3 Fatty Acids (OMEGA 3 PO) Take by mouth daily.    . psyllium (METAMUCIL) 58.6 % packet Take 2 packets by mouth 2 (two) times daily.     . quinapril (ACCUPRIL) 40 MG tablet TAKE 1 TABLET BY MOUTH ONCE DAILY 90 tablet 3   No current facility-administered medications on file prior to visit.    Social History  Substance Use Topics  . Smoking status: Never Smoker   . Smokeless tobacco: Never Used  . Alcohol Use: 7.2 oz/week    5 Glasses of wine,  7 Cans of beer per week     Comment: social    Review of Systems  Constitutional: Negative for fever and chills.  HENT: Positive for congestion, ear pain (left), sinus pressure and sneezing. Negative for rhinorrhea and sore throat.   Respiratory: Positive for cough. Negative for shortness of breath and wheezing.   Cardiovascular: Negative for chest pain and palpitations.  Gastrointestinal: Negative for nausea, vomiting and diarrhea.  Genitourinary: Negative for dysuria.  Musculoskeletal: Negative for myalgias.  Skin: Negative for rash.  Neurological: Negative for headaches.      Objective:    BP 112/80 mmHg  Pulse 77  Temp(Src) 98.6 F (37 C) (Oral)  Ht 5' 9.5" (1.765 m)  Wt 178 lb (80.74 kg)  BMI 25.92 kg/m2  SpO2 96%   Physical Exam  Constitutional: Vital signs are normal. He appears well-developed and well-nourished.  HENT:  Head: Normocephalic and atraumatic.  Right Ear: Hearing, tympanic membrane, external ear and ear canal normal. No drainage, swelling or tenderness. Tympanic membrane is not injected, not erythematous and not bulging. No middle ear effusion. No decreased hearing is noted.  Left Ear: Hearing, tympanic membrane, external ear and ear canal normal. No drainage, swelling or tenderness. Tympanic  membrane is not injected, not erythematous and not bulging.  No middle ear effusion. No decreased hearing is noted.  Nose: Rhinorrhea present. Right sinus exhibits no maxillary sinus tenderness and no frontal sinus tenderness. Left sinus exhibits no maxillary sinus tenderness and no frontal sinus tenderness.  Mouth/Throat: Uvula is midline, oropharynx is clear and moist and mucous membranes are normal. No oropharyngeal exudate, posterior oropharyngeal edema, posterior oropharyngeal erythema or tonsillar abscesses.  Eyes: Conjunctivae are normal.  Cardiovascular: Regular rhythm and normal heart sounds.   Pulmonary/Chest: Effort normal and breath sounds normal. No respiratory distress. He has no wheezes. He has no rhonchi. He has no rales.  Lymphadenopathy:       Head (right side): No submental, no submandibular, no tonsillar, no preauricular, no posterior auricular and no occipital adenopathy present.       Head (left side): No submental, no submandibular, no tonsillar, no preauricular, no posterior auricular and no occipital adenopathy present.    He has no cervical adenopathy.  Neurological: He is alert.  Skin: Skin is warm and dry.  Psychiatric: He has a normal mood and affect. His speech is normal and behavior is normal.  Vitals reviewed.      Assessment & Plan:   1. Allergic rhinitis, unspecified allergic rhinitis type Afebrile. No evidence of bacterial infection at this time. Patient and I agreed on delayed antibiotics and treatment with antihistamine.   - loratadine (CLARITIN) 10 MG tablet; Take 1 tablet (10 mg total) by mouth daily.  Dispense: 30 tablet; Refill: 3 - amoxicillin-clavulanate (AUGMENTIN) 500-125 MG tablet; Take 1 tablet (500 mg total) by mouth 3 (three) times daily.  Dispense: 21 tablet; Refill: 0    I am having Nicholas Paul start on loratadine and amoxicillin-clavulanate. I am also having him maintain his aspirin, psyllium, famotidine, Coenzyme Q10 (CO Q 10 PO), Omega-3  Fatty Acids (OMEGA 3 PO), amLODipine, quinapril, atorvastatin, hydrochlorothiazide, and meloxicam.   Meds ordered this encounter  Medications  . loratadine (CLARITIN) 10 MG tablet    Sig: Take 1 tablet (10 mg total) by mouth daily.    Dispense:  30 tablet    Refill:  3    Order Specific Question:  Supervising Provider    Answer:  Cassandria Anger [[id number]]  .  amoxicillin-clavulanate (AUGMENTIN) 500-125 MG tablet    Sig: Take 1 tablet (500 mg total) by mouth 3 (three) times daily.    Dispense:  21 tablet    Refill:  0    Order Specific Question:  Supervising Provider    Answer:  Cassandria Anger [[id number]]     Start medications as prescribed and explained to patient on After Visit Summary ( AVS). Risks, benefits, and alternatives of the medications and treatment plan prescribed today were discussed, and patient expressed understanding.   Education regarding symptom management and diagnosis given to patient.   Follow-up:Plan follow-up as discussed or as needed if any worsening symptoms or change in condition.   Continue to follow with Binnie Rail, MD for routine health maintenance.   Madelon Lips Melhorn and I agreed with plan.   Mable Paris, FNP

## 2015-12-07 ENCOUNTER — Encounter: Payer: Self-pay | Admitting: *Deleted

## 2015-12-27 ENCOUNTER — Ambulatory Visit (INDEPENDENT_AMBULATORY_CARE_PROVIDER_SITE_OTHER): Payer: Medicare Other | Admitting: Nurse Practitioner

## 2015-12-27 ENCOUNTER — Encounter: Payer: Self-pay | Admitting: Nurse Practitioner

## 2015-12-27 VITALS — BP 130/70 | HR 70 | Ht 69.5 in | Wt 176.8 lb

## 2015-12-27 DIAGNOSIS — E785 Hyperlipidemia, unspecified: Secondary | ICD-10-CM

## 2015-12-27 DIAGNOSIS — I2510 Atherosclerotic heart disease of native coronary artery without angina pectoris: Secondary | ICD-10-CM

## 2015-12-27 DIAGNOSIS — I10 Essential (primary) hypertension: Secondary | ICD-10-CM

## 2015-12-27 DIAGNOSIS — I2583 Coronary atherosclerosis due to lipid rich plaque: Secondary | ICD-10-CM | POA: Diagnosis not present

## 2015-12-27 LAB — URINALYSIS
Bilirubin Urine: NEGATIVE
Glucose, UA: NEGATIVE
Hgb urine dipstick: NEGATIVE
Ketones, ur: NEGATIVE
Leukocytes, UA: NEGATIVE
Nitrite: NEGATIVE
Protein, ur: NEGATIVE
Specific Gravity, Urine: 1.007 (ref 1.001–1.035)
pH: 7 (ref 5.0–8.0)

## 2015-12-27 MED ORDER — ATORVASTATIN CALCIUM 10 MG PO TABS: ORAL_TABLET | ORAL | 3 refills | Status: DC

## 2015-12-27 MED ORDER — QUINAPRIL HCL 40 MG PO TABS: ORAL_TABLET | ORAL | 3 refills | Status: DC

## 2015-12-27 MED ORDER — AMLODIPINE BESYLATE 5 MG PO TABS: 5.0000 mg | ORAL_TABLET | Freq: Every day | ORAL | 3 refills | Status: DC

## 2015-12-27 NOTE — Patient Instructions (Addendum)
We will be checking the following labs today - urinalysis  Medication Instructions:    Continue with your current medicines.   I did send in your refills today    Testing/Procedures To Be Arranged:  N/A  Follow-Up:   See me in one year    Other Special Instructions:   N/A    If you need a refill on your cardiac medications before your next appointment, please call your pharmacy.   Call the [place] office at [phone] if you have any questions, problems or concerns.

## 2015-12-27 NOTE — Progress Notes (Addendum)
CARDIOLOGY OFFICE NOTE  Date:  12/27/2015    Nicholas Paul Date of Birth: 05/30/1950 Medical Record [id number]  PCP:  Nicholas Rail, MD  Cardiologist:  Nicholas Paul   Chief Complaint  Patient presents with  . Coronary Artery Disease  . Hyperlipidemia  . Hypertension    1 year check     History of Present Illness: Nicholas Paul is a 65 y.o. male who presents today for a one year check. Former patient of Nicholas. Sherryl Paul. He is following with me.   He has had prior coronary CT showing soft plaque in the RCA in 2012 and a calcium score of 0. Has HTN and HLD. Has had a past lung nodule noted(4 mm) as well by CT in 2012 - but with no risk factors, no further follow up felt to be needed per radiology and after discussion with Nicholas. Mare Paul.   Last seen in August of 2016. Was doing well.   Comes back today. Here alone. He continues to do well. No chest pain. Not short of breath. Golfing. Walking most days. Happy with how he is doing. Was a little concerned that his physical from April did not include a UA (has + history of bladder cancer) and a prostate exam. PSA was ok. Not having any GU issues. BP is good at home. Very mild aching with his statin - nothing that he can't tolerate.   Past Medical History:  Diagnosis Date  . Adenomatous colon polyp 2006                                                                                                     X3 , all < 32mm  . Chest pain    ER visit 12/12 with coronary CT showing 50% soft stenosis in the RCA with otherwise patent vessels and calcium score of 0  . Coronary artery disease    soft plaque with 50% narrowing R mid coronary artery  . GERD (gastroesophageal reflux disease)   . Gilbert's syndrome   . H/O: rheumatic fever    noted after tonsillectomy  . Hematuria, microscopic   . Hyperlipidemia   . Hypertension   . IBS (irritable bowel syndrome)    Nicholas Paul  . Internal hemorrhoids   . Prostatitis   . Pulmonary  nodule    4 mm RML nodule on CT scan in 12/12. Needs follow up in one year.     Past Surgical History:  Procedure Laterality Date  . COLONOSCOPY W/ POLYPECTOMY  2006   internal hemorrhoids 2011  . CYSTOSCOPY     X4 , Nicholas Nicholas Paul; last 2011  . TONSILLECTOMY AND ADENOIDECTOMY    . VASECTOMY       Medications: Current Outpatient Prescriptions  Medication Sig Dispense Refill  . amLODipine (NORVASC) 5 MG tablet Take 1 tablet (5 mg total) by mouth daily. 90 tablet 3  . aspirin 81 MG tablet Take 81 mg by mouth daily.      Marland Kitchen atorvastatin (LIPITOR) 10 MG tablet TAKE 10 MG BY MOUTH ON MONDAYS, WEDNESDAYS, AND  FRIDAYS ONLY. 45 tablet 3  . Coenzyme Q10 (CO Q 10 PO) Take 1 capsule by mouth daily.    . famotidine (PEPCID AC) 10 MG chewable tablet Chew 10 mg by mouth as needed. For heartburn.    . hydrochlorothiazide (MICROZIDE) 12.5 MG capsule Take 1 capsule (12.5 mg total) by mouth daily. 90 capsule 3  . loratadine (CLARITIN) 10 MG tablet Take 1 tablet (10 mg total) by mouth daily. 30 tablet 3  . meloxicam (MOBIC) 15 MG tablet     . Omega-3 Fatty Acids (OMEGA 3 PO) Take by mouth daily.    . psyllium (METAMUCIL) 58.6 % packet Take 2 packets by mouth 2 (two) times daily.     . quinapril (ACCUPRIL) 40 MG tablet TAKE 1 TABLET BY MOUTH ONCE DAILY 90 tablet 3   No current facility-administered medications for this visit.     Allergies: Allergies  Allergen Reactions  . Doxazosin Mesylate     REACTION: edema on the generic  . Crestor [Rosuvastatin]     Crestor 5 mg qd caused leg myalgias ; no issue with Lipitor 10 mg M, W, & F    Social History: The patient  reports that he has never smoked. He has never used smokeless tobacco. He reports that he drinks about 7.2 oz of alcohol per week . He reports that he does not use drugs.   Family History: The patient's family history includes Cancer in his brother, father, and paternal aunt; Hyperlipidemia in his mother; Hypertension in his brother and  mother.   Review of Systems: Please see the history of present illness.   Otherwise, the review of systems is positive for none.   All other systems are reviewed and negative.   Physical Exam: VS:  BP 130/70   Pulse 70   Ht 5' 9.5" (1.765 m)   Wt 176 lb 12.8 oz (80.2 kg)   BMI 25.73 kg/m  .  BMI Body mass index is 25.73 kg/m.  Wt Readings from Last 3 Encounters:  12/27/15 176 lb 12.8 oz (80.2 kg)  09/28/15 178 lb (80.7 kg)  09/01/15 179 lb (81.2 kg)    General: Pleasant. Well developed, well nourished and in no acute distress.   HEENT: Normal.  Neck: Supple, no JVD, carotid bruits, or masses noted.  Cardiac: Regular rate and rhythm. No murmurs, rubs, or gallops. No edema.  Respiratory:  Lungs are clear to auscultation bilaterally with normal work of breathing.  GI: Soft and nontender.  MS: No deformity or atrophy. Gait and ROM intact.  Skin: Warm and dry. Color is normal.  Neuro:  Strength and sensation are intact and no gross focal deficits noted.  Psych: Alert, appropriate and with normal affect.   LABORATORY DATA:  EKG:  EKG is ordered today. This demonstrates NSR.  Lab Results  Component Value Date   WBC 7.1 09/01/2015   HGB 15.0 09/01/2015   HCT 44.5 09/01/2015   PLT 214.0 09/01/2015   GLUCOSE 115 (H) 09/01/2015   CHOL 161 09/01/2015   TRIG 73.0 09/01/2015   HDL 58.70 09/01/2015   LDLCALC 87 09/01/2015   ALT 21 09/01/2015   AST 17 09/01/2015   NA 139 09/01/2015   K 4.9 09/01/2015   CL 103 09/01/2015   CREATININE 0.99 09/01/2015   BUN 15 09/01/2015   CO2 31 09/01/2015   TSH 0.64 09/01/2015   PSA 2.53 09/01/2015   HGBA1C 5.4 08/30/2014    BNP (last 3 results) No results for input(s):  BNP in the last 8760 hours.  ProBNP (last 3 results) No results for input(s): PROBNP in the last 8760 hours.   Other Studies Reviewed Today:  Echo Study Conclusions from 2013  - Left ventricle: The cavity size was normal. Wall thickness was increased in a  pattern of mild LVH. Systolic function was normal. The estimated ejection fraction was in the range of 55% to 60%. Wall motion was normal; there were no regional wall motion abnormalities. Doppler parameters are consistent with abnormal left ventricular relaxation (grade 1 diastolic dysfunction). - Atrial septum: No defect or patent foramen ovale was identified.   Assessment/Plan: 1. Coronary plaque on past CT - managed with CV risk factor modification.   2. HTN - BP is doing well. Good outpatient control. No change in current therapy. I have refilled his medicine today.   3. HLD - on 3 times a week statin - labs reviewed from April  4. +FH of bladder cancer - check UA today  Current medicines are reviewed with the patient today.  The patient does not have concerns regarding medicines other than what has been noted above.  The following changes have been made:  See above.  Labs/ tests ordered today include:    Orders Placed This Encounter  Procedures  . Urinalysis  . EKG 12-Lead     Disposition:   FU with me in 1 year.   Patient is agreeable to this plan and will call if any problems develop in the interim.   Signed: Burtis Junes, RN, ANP-C 12/27/2015 9:35 AM  [facility] [address] [place], [place]  [address] Phone: [phone] Fax: [phone]

## 2016-02-01 DIAGNOSIS — H903 Sensorineural hearing loss, bilateral: Secondary | ICD-10-CM | POA: Diagnosis not present

## 2016-02-01 DIAGNOSIS — H9313 Tinnitus, bilateral: Secondary | ICD-10-CM | POA: Diagnosis not present

## 2016-03-12 DIAGNOSIS — D485 Neoplasm of uncertain behavior of skin: Secondary | ICD-10-CM | POA: Diagnosis not present

## 2016-03-12 DIAGNOSIS — C44329 Squamous cell carcinoma of skin of other parts of face: Secondary | ICD-10-CM | POA: Diagnosis not present

## 2016-03-12 DIAGNOSIS — L570 Actinic keratosis: Secondary | ICD-10-CM | POA: Diagnosis not present

## 2016-03-12 DIAGNOSIS — D225 Melanocytic nevi of trunk: Secondary | ICD-10-CM | POA: Diagnosis not present

## 2016-03-12 DIAGNOSIS — L814 Other melanin hyperpigmentation: Secondary | ICD-10-CM | POA: Diagnosis not present

## 2016-03-12 DIAGNOSIS — L821 Other seborrheic keratosis: Secondary | ICD-10-CM | POA: Diagnosis not present

## 2016-04-23 DIAGNOSIS — L720 Epidermal cyst: Secondary | ICD-10-CM | POA: Diagnosis not present

## 2016-05-28 ENCOUNTER — Encounter: Payer: Self-pay | Admitting: Internal Medicine

## 2016-05-31 ENCOUNTER — Ambulatory Visit: Payer: Medicare Other

## 2016-06-28 ENCOUNTER — Ambulatory Visit (AMBULATORY_SURGERY_CENTER): Payer: Self-pay

## 2016-06-28 VITALS — Ht 69.0 in | Wt 182.6 lb

## 2016-06-28 DIAGNOSIS — Z860100 Personal history of colon polyps, unspecified: Secondary | ICD-10-CM

## 2016-06-28 NOTE — Progress Notes (Signed)
No allergies to eggs or soy No past problems with anesthesia No diet meds No home oxygen  Declined emmi 

## 2016-07-11 ENCOUNTER — Other Ambulatory Visit: Payer: Self-pay | Admitting: Nurse Practitioner

## 2016-07-12 ENCOUNTER — Ambulatory Visit (AMBULATORY_SURGERY_CENTER): Payer: Medicare Other | Admitting: Internal Medicine

## 2016-07-12 ENCOUNTER — Encounter: Payer: Self-pay | Admitting: Internal Medicine

## 2016-07-12 VITALS — BP 109/82 | HR 78 | Temp 98.6°F | Resp 16 | Ht 69.0 in | Wt 182.0 lb

## 2016-07-12 DIAGNOSIS — Z86010 Personal history of colonic polyps: Secondary | ICD-10-CM

## 2016-07-12 DIAGNOSIS — D122 Benign neoplasm of ascending colon: Secondary | ICD-10-CM

## 2016-07-12 DIAGNOSIS — Z1211 Encounter for screening for malignant neoplasm of colon: Secondary | ICD-10-CM | POA: Diagnosis not present

## 2016-07-12 MED ORDER — SODIUM CHLORIDE 0.9 % IV SOLN: 500.0000 mL | INTRAVENOUS | Status: DC

## 2016-07-12 NOTE — Progress Notes (Signed)
A and O x3. Report to RN. Tolerated MAC anesthesia well.

## 2016-07-12 NOTE — Progress Notes (Signed)
Called to room to assist during endoscopic procedure.  Patient ID and intended procedure confirmed with present staff. Received instructions for my participation in the procedure from the performing physician.  

## 2016-07-12 NOTE — Patient Instructions (Addendum)
   I found and removed one tiny polyp. I think that the bowel changes are probably some Irritable Bowel syndrome. Increase fiber or try taking some MiraLax on a regular basis.  I appreciate the opportunity to care for you. Gatha Mayer, MD, FACG  YOU HAD AN ENDOSCOPIC PROCEDURE TODAY AT [facility]:   Refer to the procedure report that was given to you for any specific questions about what was found during the examination.  If the procedure report does not answer your questions, please call your gastroenterologist to clarify.  If you requested that your care partner not be given the details of your procedure findings, then the procedure report has been included in a sealed envelope for you to review at your convenience later.  YOU SHOULD EXPECT: Some feelings of bloating in the abdomen. Passage of more gas than usual.  Walking can help get rid of the air that was put into your GI tract during the procedure and reduce the bloating. If you had a lower endoscopy (such as a colonoscopy or flexible sigmoidoscopy) you may notice spotting of blood in your stool or on the toilet paper. If you underwent a bowel prep for your procedure, you may not have a normal bowel movement for a few days.  Please Note:  You might notice some irritation and congestion in your nose or some drainage.  This is from the oxygen used during your procedure.  There is no need for concern and it should clear up in a day or so.  SYMPTOMS TO REPORT IMMEDIATELY:   Following lower endoscopy (colonoscopy or flexible sigmoidoscopy):  Excessive amounts of blood in the stool  Significant tenderness or worsening of abdominal pains  Swelling of the abdomen that is new, acute  Fever of 100F or higher   For urgent or emergent issues, a gastroenterologist can be reached at any hour by calling [phone].   DIET:  We do recommend a small meal at first, but then you may proceed to your regular diet.  Drink  plenty of fluids but you should avoid alcoholic beverages for 24 hours.  ACTIVITY:  You should plan to take it easy for the rest of today and you should NOT DRIVE or use heavy machinery until tomorrow (because of the sedation medicines used during the test).    FOLLOW UP: Our staff will call the number listed on your records the next business day following your procedure to check on you and address any questions or concerns that you may have regarding the information given to you following your procedure. If we do not reach you, we will leave a message.  However, if you are feeling well and you are not experiencing any problems, there is no need to return our call.  We will assume that you have returned to your regular daily activities without incident.  If any biopsies were taken you will be contacted by phone or by letter within the next 1-3 weeks.  Please call us at [phone] if you have not heard about the biopsies in 3 weeks.    SIGNATURES/CONFIDENTIALITY: You and/or your care partner have signed paperwork which will be entered into your electronic medical record.  These signatures attest to the fact that that the information above on your After Visit Summary has been reviewed and is understood.  Full responsibility of the confidentiality of this discharge information lies with you and/or your care-partner.  Polyp and high fiber diet information given.

## 2016-07-12 NOTE — Progress Notes (Signed)
Monday night ate pizza and began vomiting tues am 13:00 am.  Vomited 6 times, only ate 1 bowl of honey nut cherrios.  Maw    Pt's states no medical or surgical changes since previsit or office visit.maw

## 2016-07-12 NOTE — Op Note (Signed)
[place] Patient Name: Nicholas Paul Procedure Date: 07/12/2016 11:29 AM MRN: [id number] Endoscopist: Gatha Mayer , MD Age: 66 Referring MD:  Date of Birth: 01/04/1951 Gender: Male Account #: [id number] Procedure:                Colonoscopy Indications:              Surveillance: Personal history of adenomatous                            polyps on last colonoscopy > 5 years ago Medicines:                Propofol per Anesthesia, Monitored Anesthesia Care Procedure:                Pre-Anesthesia Assessment:                           - Prior to the procedure, a History and Physical                            was performed, and patient medications and                            allergies were reviewed. The patient's tolerance of                            previous anesthesia was also reviewed. The risks                            and benefits of the procedure and the sedation                            options and risks were discussed with the patient.                            All questions were answered, and informed consent                            was obtained. Prior Anticoagulants: The patient                            last took aspirin 1 day prior to the procedure. ASA                            Grade Assessment: II - A patient with mild systemic                            disease. After reviewing the risks and benefits,                            the patient was deemed in satisfactory condition to                            undergo the procedure.  After obtaining informed consent, the colonoscope                            was passed under direct vision. Throughout the                            procedure, the patient's blood pressure, pulse, and                            oxygen saturations were monitored continuously. The                            Colonoscope was introduced through the anus and                            advanced to  the the cecum, identified by                            appendiceal orifice and ileocecal valve. The                            colonoscopy was performed without difficulty. The                            patient tolerated the procedure well. The quality                            of the bowel preparation was good. The ileocecal                            valve, appendiceal orifice, and rectum were                            photographed. The bowel preparation used was                            Miralax. Scope In: 11:42:17 AM Scope Out: 11:53:30 AM Scope Withdrawal Time: 0 hours 8 minutes 55 seconds  Total Procedure Duration: 0 hours 11 minutes 13 seconds  Findings:                 The perianal and digital rectal examinations were                            normal. Pertinent negatives include normal prostate                            (size, shape, and consistency).                           A diminutive polyp was found in the ascending                            colon. The polyp was sessile. The polyp was removed  with a cold snare. Resection and retrieval were                            complete. Verification of patient identification                            for the specimen was done. Estimated blood loss was                            minimal.                           The exam was otherwise without abnormality on                            direct and retroflexion views. Complications:            No immediate complications. Estimated Blood Loss:     Estimated blood loss was minimal. Impression:               - One diminutive polyp in the ascending colon,                            removed with a cold snare. Resected and retrieved.                           - The examination was otherwise normal on direct                            and retroflexion views.                           - Personal history of colonic polyps. Adenomas 2006 Recommendation:            - Patient has a contact number available for                            emergencies. The signs and symptoms of potential                            delayed complications were discussed with the                            patient. Return to normal activities tomorrow.                            Written discharge instructions were provided to the                            patient.                           - High fiber diet.                           - Continue present medications.                           -  Repeat colonoscopy is recommended for                            surveillance. The colonoscopy date will be                            determined after pathology results from today's                            exam become available for review. Gatha Mayer, MD 07/12/2016 12:08:31 PM This report has been signed electronically.

## 2016-07-13 ENCOUNTER — Telehealth: Payer: Self-pay

## 2016-07-13 NOTE — Telephone Encounter (Signed)
  Follow up Call-  Call back number 07/12/2016  Post procedure Call Back phone  # [phone] cell  Permission to leave phone message Yes  Some recent data might be hidden     Patient questions:  Do you have a fever, pain , or abdominal swelling? No. Pain Score  0 *  Have you tolerated food without any problems? Yes.    Have you been able to return to your normal activities? Yes.    Do you have any questions about your discharge instructions: Diet   No. Medications  No. Follow up visit  No.  Do you have questions or concerns about your Care? No.  Actions: * If pain score is 4 or above: No action needed, pain <4.

## 2016-07-23 ENCOUNTER — Encounter: Payer: Self-pay | Admitting: Internal Medicine

## 2016-07-23 DIAGNOSIS — Z85828 Personal history of other malignant neoplasm of skin: Secondary | ICD-10-CM | POA: Diagnosis not present

## 2016-07-23 DIAGNOSIS — L905 Scar conditions and fibrosis of skin: Secondary | ICD-10-CM | POA: Diagnosis not present

## 2016-07-23 DIAGNOSIS — L578 Other skin changes due to chronic exposure to nonionizing radiation: Secondary | ICD-10-CM | POA: Diagnosis not present

## 2016-07-23 NOTE — Progress Notes (Signed)
Diminutive adenoma Recall 5 years 2023 My Chart letter

## 2016-09-14 ENCOUNTER — Other Ambulatory Visit: Payer: Self-pay | Admitting: Family

## 2016-09-14 DIAGNOSIS — J309 Allergic rhinitis, unspecified: Secondary | ICD-10-CM

## 2016-09-20 ENCOUNTER — Other Ambulatory Visit: Payer: Self-pay | Admitting: Emergency Medicine

## 2016-09-20 MED ORDER — LORATADINE 10 MG PO TABS: 10.0000 mg | ORAL_TABLET | Freq: Every day | ORAL | 0 refills | Status: DC

## 2016-12-13 NOTE — Patient Instructions (Addendum)
  Mr. Bublitz , Thank you for taking time to come for your Medicare Wellness Visit. I appreciate your ongoing commitment to your health goals. Please review the following plan we discussed and let me know if I can assist you in the future.   These are the goals we discussed: Goals    None      This is a list of the screening recommended for you and due dates:  Health Maintenance  Topic Date Due  . Flu Shot  12/12/2016  . Pneumonia vaccines (1 of 2 - PCV13) 09/01/2018*  . Tetanus Vaccine  01/03/2021  . Colon Cancer Screening  07/12/2021  .  Hepatitis C: One time screening is recommended by Center for Disease Control  (CDC) for  adults born from [birth year] through 1966.   Completed  *Topic was postponed. The date shown is not the original due date.    Test(s) ordered today. Your results will be released to [place] (or called to you) after review, usually within 72hours after test completion. If any changes need to be made, you will be notified at that same time.  All other Health Maintenance issues reviewed.   All recommended immunizations and age-appropriate screenings are up-to-date or discussed.  No immunizations administered today. Think about getting a shingles vaccines.  Think about getting a pneumonia vaccine.   Medications reviewed and updated.  No changes recommended at this time.     Please followup in one year

## 2016-12-13 NOTE — Progress Notes (Signed)
Subjective:    Patient ID: Nicholas Paul, male    DOB: 1950/12/18, 66 y.o.   MRN: [id number]  HPI Here for medicare wellness exam and follow up of his chronic medical problems.   I have personally reviewed and have noted 1.The patient's medical and social history 2.Their use of alcohol, tobacco or illicit drugs 3.Their current medications and supplements 4.The patient's functional ability including ADL's, fall risks, home safety risks and hearing or visual impairment. 5.Diet and physical activities 6.Evidence for depression or mood disorders 7.Care team reviewed - Derm - Dr Allyson Sabal, GI - Dr Carlean Purl   Hypertension: He is taking his medication daily. He is compliant with a low sodium diet.  He denies chest pain, palpitations, edema, shortness of breath and regular headaches. He is exercising.  He does monitor his blood pressure at home 125/75-130/80.    Hyperlipidemia: He is taking his medication daily. He is compliant with a low fat/cholesterol diet. He is exercising. He denies myalgias.   Hyperglycemia:  He is compliant with a low sugar/carb diet.  He is very active and golfs regularly.    Are there smokers in your home (other than you)? No  Risk Factors Exercise: golf, stretching exercises Dietary issues discussed: overall eating well - low sugar/carb, low fat. Well balanced, healthy  Cardiac risk factors: advanced age, hypertension, hyperlipidemia  Depression Screen  Have you felt down, depressed or hopeless? No  Have you felt little interest or pleasure in doing things?  No  Activities of Daily Living In your present state of health, do you have any difficulty performing the following activities?:  Driving? No Managing money?  No Feeding yourself? No Getting from bed to chair? No Climbing a flight of stairs? No Preparing food and eating?: No Bathing or showering? No Getting dressed: No Getting  to/using the toilet? No Moving around from place to place: No In the past year have you fallen or had a near fall?: No   Are you sexually active?  yes  Do you have more than one partner?  No   Hearing Difficulties:  Do you often ask people to speak up or repeat themselves? yes Do you experience ringing or noises in your ears? yes Do you have difficulty understanding soft or whispered voices? yes Vision:              Any change in vision:  no             Up to date with eye exam: yes  Memory:  Do you feel that you have a problem with memory? No  Do you often misplace items? No  Do you feel safe at home?  Yes  Cognitive Testing  Alert, Orientated? Yes  Normal Appearance? Yes  Recall of three objects?  Yes  Can perform simple calculations? Yes  Displays appropriate judgment? Yes  Can read the correct time from a watch face? Yes   Advanced Directives have been discussed with the patient? Yes   Medications and allergies reviewed with patient and updated if appropriate.  Patient Active Problem List   Diagnosis Date Noted  . Hyperglycemia 09/03/2015  . Gilbert syndrome 07/30/2011  . Pulmonary nodule 05/21/2011  . ARTHRALGIA 07/14/2010  . LOW BACK PAIN SYNDROME 07/14/2010  . Irritable bowel syndrome 04/04/2010  . Actinic keratosis 10/20/2009  . History of colonic polyps 10/20/2009  . HEMATURIA, MICROSCOPIC, HX OF 10/20/2009  . Hyperlipidemia 07/31/2007  . Essential hypertension 07/31/2007    Current Outpatient Prescriptions  on File Prior to Visit  Medication Sig Dispense Refill  . amLODipine (NORVASC) 5 MG tablet Take 1 tablet (5 mg total) by mouth daily. 90 tablet 3  . aspirin 81 MG tablet Take 81 mg by mouth daily.      Marland Kitchen atorvastatin (LIPITOR) 10 MG tablet TAKE 10 MG BY MOUTH ON MONDAYS, WEDNESDAYS, AND FRIDAYS ONLY. 45 tablet 3  . Coenzyme Q10 (CO Q 10 PO) Take 1 capsule by mouth daily.    . famotidine (PEPCID AC) 10 MG chewable tablet Chew 10 mg by mouth as needed.  For heartburn.    . hydrochlorothiazide (MICROZIDE) 12.5 MG capsule TAKE ONE CAPSULE BY MOUTH EVERY DAY 90 capsule 1  . loratadine (CLARITIN) 10 MG tablet Take 1 tablet (10 mg total) by mouth daily. -- Office visit needed for further refills 30 tablet 0  . Omega-3 Fatty Acids (OMEGA 3 PO) Take by mouth daily.    . psyllium (METAMUCIL) 58.6 % packet Take 2 packets by mouth 2 (two) times daily.     . quinapril (ACCUPRIL) 40 MG tablet TAKE 1 TABLET BY MOUTH ONCE DAILY 90 tablet 3   No current facility-administered medications on file prior to visit.     Past Medical History:  Diagnosis Date  . Adenomatous colon polyp 2006                                                                                                     X3 , all < 58mm  . Chest pain    ER visit 12/12 with coronary CT showing 50% soft stenosis in the RCA with otherwise patent vessels and calcium score of 0  . Coronary artery disease    soft plaque with 50% narrowing R mid coronary artery  . GERD (gastroesophageal reflux disease)   . Gilbert's syndrome   . H/O: rheumatic fever    noted after tonsillectomy  . Hematuria, microscopic   . Hyperlipidemia   . Hypertension   . IBS (irritable bowel syndrome)    Dr Carlean Purl  . Internal hemorrhoids   . Prostatitis   . Pulmonary nodule    4 mm RML nodule on CT scan in 12/12. Needs follow up in one year.     Past Surgical History:  Procedure Laterality Date  . COLONOSCOPY W/ POLYPECTOMY  2006   internal hemorrhoids 2011  . CYSTOSCOPY     X4 , Dr Terance Hart; last 2011  . TONSILLECTOMY AND ADENOIDECTOMY    . VASECTOMY      Social History   Social History  . Marital status: Married    Spouse name: N/A  . Number of children: 2  . Years of education: N/A   Occupational History  .  [employer]   Social History Main Topics  . Smoking status: Never Smoker  . Smokeless tobacco: Never Used  . Alcohol use 4.8 oz/week    3 Glasses of wine, 5 Cans of beer per week      Comment: social  . Drug use: No  . Sexual activity: Yes   Other Topics  Concern  . Not on file   Social History Narrative  . No narrative on file    Family History  Problem Relation Age of Onset  . Cancer Father        bladder cancer  . Cancer Brother        bladder cancer  . Hypertension Brother   . Cancer Paternal Aunt        bladder cancer  . Hyperlipidemia Mother   . Hypertension Mother   . Stroke Neg Hx   . Diabetes Neg Hx   . Heart attack Neg Hx   . Colon cancer Neg Hx     Review of Systems  Constitutional: Negative for chills and fever.  HENT: Positive for hearing loss (mild). Negative for tinnitus.   Eyes: Negative for visual disturbance.  Respiratory: Negative for cough, shortness of breath and wheezing.   Cardiovascular: Negative for chest pain, palpitations and leg swelling.  Gastrointestinal: Positive for constipation (occ). Negative for abdominal pain, blood in stool, diarrhea and nausea.       No gerd  Genitourinary: Positive for frequency. Negative for dysuria and hematuria.  Musculoskeletal: Positive for arthralgias (occ). Negative for back pain and myalgias.  Skin: Negative for color change and rash.  Neurological: Negative for dizziness, light-headedness and headaches.  Psychiatric/Behavioral: Negative for dysphoric mood. The patient is not nervous/anxious.        Objective:   Vitals:   12/14/16 1412  BP: 124/82  Pulse: 75  Resp: 16  Temp: 98.7 F (37.1 C)   Filed Weights   12/14/16 1412  Weight: 178 lb (80.7 kg)   Body mass index is 26.29 kg/m.  Wt Readings from Last 3 Encounters:  12/14/16 178 lb (80.7 kg)  07/12/16 182 lb (82.6 kg)  06/28/16 182 lb 9.6 oz (82.8 kg)     Physical Exam Constitutional: He appears well-developed and well-nourished. No distress.  HENT:  Head: Normocephalic and atraumatic.  Right Ear: External ear normal.  Left Ear: External ear normal.  Mouth/Throat: Oropharynx is clear and moist.  Normal ear  canals and TM b/l  Eyes: Conjunctivae and EOM are normal.  Neck: Neck supple. No tracheal deviation present. No thyromegaly present.  No carotid bruit  Cardiovascular: Normal rate, regular rhythm, normal heart sounds and intact distal pulses.  No murmur heard. Pulmonary/Chest: Effort normal and breath sounds normal. No respiratory distress. He has no wheezes. He has no rales.  Abdominal: Soft. Bowel sounds are normal. He exhibits no distension. There is no tenderness.  Genitourinary: slightly enlarged prostate without nodules Musculoskeletal: He exhibits no edema.  Lymphadenopathy:   He has no cervical adenopathy.  Skin: Skin is warm and dry. He is not diaphoretic.  Psychiatric: He has a normal mood and affect. His behavior is normal.        Assessment & Plan:   Wellness Exam: Immunizations discussed shingrix, others up to date Colonoscopy   Up to date  Eye exam Up to date  Hearing loss   Yes, mild - has had it evaluated, does not feel he needs hearing aids Memory concerns/difficulties   none Independent of ADLs  fully Stressed the importance of regular exercise   Patient received copy of preventative screening tests/immunizations recommended for the next 5-10 years.   See Problem List for Assessment and Plan of chronic medical problems.

## 2016-12-14 ENCOUNTER — Ambulatory Visit (INDEPENDENT_AMBULATORY_CARE_PROVIDER_SITE_OTHER): Payer: Medicare Other | Admitting: Internal Medicine

## 2016-12-14 ENCOUNTER — Other Ambulatory Visit (INDEPENDENT_AMBULATORY_CARE_PROVIDER_SITE_OTHER): Payer: Medicare Other

## 2016-12-14 ENCOUNTER — Encounter: Payer: Self-pay | Admitting: Internal Medicine

## 2016-12-14 VITALS — BP 124/82 | HR 75 | Temp 98.7°F | Resp 16 | Ht 69.0 in | Wt 178.0 lb

## 2016-12-14 DIAGNOSIS — I10 Essential (primary) hypertension: Secondary | ICD-10-CM

## 2016-12-14 DIAGNOSIS — Z0000 Encounter for general adult medical examination without abnormal findings: Secondary | ICD-10-CM | POA: Diagnosis not present

## 2016-12-14 DIAGNOSIS — Z125 Encounter for screening for malignant neoplasm of prostate: Secondary | ICD-10-CM | POA: Diagnosis not present

## 2016-12-14 DIAGNOSIS — R739 Hyperglycemia, unspecified: Secondary | ICD-10-CM

## 2016-12-14 DIAGNOSIS — R350 Frequency of micturition: Secondary | ICD-10-CM

## 2016-12-14 DIAGNOSIS — E7800 Pure hypercholesterolemia, unspecified: Secondary | ICD-10-CM

## 2016-12-14 LAB — URINALYSIS, ROUTINE W REFLEX MICROSCOPIC
Bilirubin Urine: NEGATIVE
Ketones, ur: NEGATIVE
LEUKOCYTES UA: NEGATIVE
NITRITE: NEGATIVE
Specific Gravity, Urine: 1.01 (ref 1.000–1.030)
Total Protein, Urine: NEGATIVE
URINE GLUCOSE: NEGATIVE
Urobilinogen, UA: 0.2 (ref 0.0–1.0)
pH: 6 (ref 5.0–8.0)

## 2016-12-14 LAB — LIPID PANEL
CHOLESTEROL: 158 mg/dL (ref 0–200)
HDL: 59.7 mg/dL (ref >39.00)
LDL CALC: 80 mg/dL (ref 0–99)
NonHDL: 98.66
TRIGLYCERIDES: 95 mg/dL (ref 0.0–149.0)
Total CHOL/HDL Ratio: 3
VLDL: 19 mg/dL (ref 0.0–40.0)

## 2016-12-14 LAB — CBC WITH DIFFERENTIAL/PLATELET
Basophils Absolute: 0.1 K/uL (ref 0.0–0.1)
Basophils Relative: 0.9 % (ref 0.0–3.0)
Eosinophils Absolute: 0 K/uL (ref 0.0–0.7)
Eosinophils Relative: 0.3 % (ref 0.0–5.0)
HCT: 45.4 % (ref 39.0–52.0)
Hemoglobin: 15.6 g/dL (ref 13.0–17.0)
Lymphocytes Relative: 26.7 % (ref 12.0–46.0)
Lymphs Abs: 2 K/uL (ref 0.7–4.0)
MCHC: 34.4 g/dL (ref 30.0–36.0)
MCV: 88.9 fl (ref 78.0–100.0)
Monocytes Absolute: 0.4 K/uL (ref 0.1–1.0)
Monocytes Relative: 5.9 % (ref 3.0–12.0)
Neutro Abs: 4.9 K/uL (ref 1.4–7.7)
Neutrophils Relative %: 66.2 % (ref 43.0–77.0)
Platelets: 209 K/uL (ref 150.0–400.0)
RBC: 5.1 Mil/uL (ref 4.22–5.81)
RDW: 12.6 % (ref 11.5–15.5)
WBC: 7.4 K/uL (ref 4.0–10.5)

## 2016-12-14 LAB — PSA, MEDICARE: PSA: 4.02 ng/ml — ABNORMAL HIGH (ref 0.10–4.00)

## 2016-12-14 LAB — COMPREHENSIVE METABOLIC PANEL
ALK PHOS: 64 U/L (ref 39–117)
ALT: 21 U/L (ref 0–53)
AST: 17 U/L (ref 0–37)
Albumin: 4.6 g/dL (ref 3.5–5.2)
BILIRUBIN TOTAL: 1.8 mg/dL — AB (ref 0.2–1.2)
BUN: 16 mg/dL (ref 6–23)
CO2: 29 meq/L (ref 19–32)
CREATININE: 0.98 mg/dL (ref 0.40–1.50)
Calcium: 9.3 mg/dL (ref 8.4–10.5)
Chloride: 101 mEq/L (ref 96–112)
GFR: 81.05 mL/min (ref >60.00)
GLUCOSE: 103 mg/dL — AB (ref 70–99)
Potassium: 3.8 mEq/L (ref 3.5–5.1)
Sodium: 139 mEq/L (ref 135–145)
TOTAL PROTEIN: 7.5 g/dL (ref 6.0–8.3)

## 2016-12-14 LAB — HEMOGLOBIN A1C: Hgb A1c MFr Bld: 5.4 % (ref 4.6–6.5)

## 2016-12-14 LAB — TSH: TSH: 0.55 u[IU]/mL (ref 0.35–4.50)

## 2016-12-14 MED ORDER — SILDENAFIL CITRATE 100 MG PO TABS: 100.0000 mg | ORAL_TABLET | Freq: Every day | ORAL | 11 refills | Status: DC | PRN

## 2016-12-14 MED ORDER — ZOSTER VAC RECOMB ADJUVANTED 50 MCG/0.5ML IM SUSR: 0.5000 mL | Freq: Once | INTRAMUSCULAR | 1 refills | Status: AC

## 2016-12-14 NOTE — Assessment & Plan Note (Signed)
Check a1c Low sugar / carb diet Stressed regular exercise   

## 2016-12-14 NOTE — Assessment & Plan Note (Addendum)
Will check UA, UCx Has family history of bladder cancer

## 2016-12-14 NOTE — Assessment & Plan Note (Signed)
Check lipid panel  Continue daily statin Regular exercise and healthy diet encouraged  

## 2016-12-14 NOTE — Assessment & Plan Note (Signed)
BP well controlled Current regimen effective and well tolerated Continue current medications at current doses Cmp, cbc, tsh 

## 2016-12-15 LAB — URINE CULTURE: ORGANISM ID, BACTERIA: NO GROWTH

## 2016-12-16 ENCOUNTER — Encounter: Payer: Self-pay | Admitting: Internal Medicine

## 2016-12-18 ENCOUNTER — Other Ambulatory Visit: Payer: Self-pay | Admitting: Nurse Practitioner

## 2016-12-25 ENCOUNTER — Ambulatory Visit (INDEPENDENT_AMBULATORY_CARE_PROVIDER_SITE_OTHER): Payer: Medicare Other | Admitting: Nurse Practitioner

## 2016-12-25 ENCOUNTER — Encounter: Payer: Self-pay | Admitting: Nurse Practitioner

## 2016-12-25 VITALS — BP 110/80 | HR 73 | Ht 69.0 in | Wt 179.6 lb

## 2016-12-25 DIAGNOSIS — I10 Essential (primary) hypertension: Secondary | ICD-10-CM | POA: Diagnosis not present

## 2016-12-25 MED ORDER — ATORVASTATIN CALCIUM 10 MG PO TABS: ORAL_TABLET | ORAL | 3 refills | Status: DC

## 2016-12-25 MED ORDER — QUINAPRIL HCL 40 MG PO TABS
ORAL_TABLET | ORAL | 3 refills | Status: DC
Start: 2016-12-25 — End: 2018-01-07

## 2016-12-25 MED ORDER — AMLODIPINE BESYLATE 5 MG PO TABS: 5.0000 mg | ORAL_TABLET | Freq: Every day | ORAL | 3 refills | Status: DC

## 2016-12-25 MED ORDER — HYDROCHLOROTHIAZIDE 12.5 MG PO CAPS: 12.5000 mg | ORAL_CAPSULE | Freq: Every day | ORAL | 3 refills | Status: DC

## 2016-12-25 NOTE — Progress Notes (Signed)
CARDIOLOGY OFFICE NOTE  Date:  12/25/2016    Nicholas Paul Date of Birth: 1950/07/18 Medical Record #[id number]  PCP:  Binnie Rail, MD  Cardiologist:  Servando Snare   Chief Complaint  Patient presents with  . Hypertension  . Hyperlipidemia    Follow up visit     History of Present Illness: Nicholas Paul is a 66 y.o. male who presents today for a follow up visit. Former patient of Dr. Sherryl Barters. He is following with me.   He has had prior coronary CT showing soft plaque in the RCA in 2012 and a calcium score of 0. Has HTN and HLD.  Strong FH for bladder cancer. Has had a past lung nodule noted(4 mm) as well by CT in 2012 - but with no risk factors, no further follow up felt to be needed per radiology and after discussion with Dr. Mare Ferrari.   Last seen in August of 2017. Was doing well.   Comes back today. Here alone. Continues to do well. No chest pain. Breathing is good. BP is good. Weight is stable. Feels ok on his medicines. Recent labs noted. Not exercising as much this summer but knows he should be. Needs medicines refilled. Overall, has no real concerns. Some blood in recent UA and PSA has creeped up - seeing urology next month.   Past Medical History:  Diagnosis Date  . Adenomatous colon polyp 2006                                                                                                     X3 , all < 3mm  . Chest pain    ER visit 12/12 with coronary CT showing 50% soft stenosis in the RCA with otherwise patent vessels and calcium score of 0  . Coronary artery disease    soft plaque with 50% narrowing R mid coronary artery  . GERD (gastroesophageal reflux disease)   . Gilbert's syndrome   . H/O: rheumatic fever    noted after tonsillectomy  . Hematuria, microscopic   . Hyperlipidemia   . Hypertension   . IBS (irritable bowel syndrome)    Dr Carlean Purl  . Internal hemorrhoids   . Prostatitis   . Pulmonary nodule    4 mm RML nodule on CT scan in  12/12. Needs follow up in one year.     Past Surgical History:  Procedure Laterality Date  . COLONOSCOPY W/ POLYPECTOMY  2006   internal hemorrhoids 2011  . CYSTOSCOPY     X4 , Dr Terance Hart; last 2011  . TONSILLECTOMY AND ADENOIDECTOMY    . VASECTOMY       Medications: Current Meds  Medication Sig  . amLODipine (NORVASC) 5 MG tablet Take 1 tablet (5 mg total) by mouth daily.  Marland Kitchen aspirin 81 MG tablet Take 81 mg by mouth daily.    Marland Kitchen atorvastatin (LIPITOR) 10 MG tablet TAKE 10 MG BY MOUTH ON MONDAYS, WEDNESDAYS, AND FRIDAYS ONLY.  Marland Kitchen Coenzyme Q10 (CO Q 10 PO) Take 1 capsule by mouth daily.  . famotidine (PEPCID  AC) 10 MG chewable tablet Chew 10 mg by mouth as needed. For heartburn.  . hydrochlorothiazide (MICROZIDE) 12.5 MG capsule Take 1 capsule (12.5 mg total) by mouth daily.  Marland Kitchen loratadine (CLARITIN) 10 MG tablet Take 1 tablet (10 mg total) by mouth daily. -- Office visit needed for further refills  . Omega-3 Fatty Acids (OMEGA 3 PO) Take by mouth daily.  . psyllium (METAMUCIL) 58.6 % packet Take 2 packets by mouth 2 (two) times daily.   . quinapril (ACCUPRIL) 40 MG tablet TAKE 1 TABLET BY MOUTH ONCE DAILY  . sildenafil (VIAGRA) 100 MG tablet Take 1 tablet (100 mg total) by mouth daily as needed for erectile dysfunction.  . [DISCONTINUED] amLODipine (NORVASC) 5 MG tablet TAKE 1 TABLET (5 MG TOTAL) BY MOUTH DAILY.  . [DISCONTINUED] atorvastatin (LIPITOR) 10 MG tablet TAKE 10 MG BY MOUTH ON MONDAYS, WEDNESDAYS, AND FRIDAYS ONLY.  . [DISCONTINUED] hydrochlorothiazide (MICROZIDE) 12.5 MG capsule TAKE ONE CAPSULE BY MOUTH EVERY DAY  . [DISCONTINUED] quinapril (ACCUPRIL) 40 MG tablet TAKE 1 TABLET BY MOUTH ONCE DAILY     Allergies: Allergies  Allergen Reactions  . Doxazosin Mesylate     REACTION: edema on the generic.  Swelling around eyes per pt. maw  . Crestor [Rosuvastatin]     Crestor 5 mg qd caused leg myalgias ; no issue with Lipitor 10 mg M, W, & F    Social History: The  patient  reports that he has never smoked. He has never used smokeless tobacco. He reports that he drinks about 4.8 oz of alcohol per week . He reports that he does not use drugs.   Family History: The patient's family history includes Cancer in his brother, father, and paternal aunt; Hyperlipidemia in his mother; Hypertension in his brother and mother.   Review of Systems: Please see the history of present illness.   Otherwise, the review of systems is positive for none.   All other systems are reviewed and negative.   Physical Exam: VS:  BP 110/80 (BP Location: Left Arm, Patient Position: Sitting, Cuff Size: Normal)   Pulse 73   Ht 5\' 9"  (1.753 m)   Wt 179 lb 9.6 oz (81.5 kg)   BMI 26.52 kg/m  .  BMI Body mass index is 26.52 kg/m.  Wt Readings from Last 3 Encounters:  12/25/16 179 lb 9.6 oz (81.5 kg)  12/14/16 178 lb (80.7 kg)  07/12/16 182 lb (82.6 kg)    General: Pleasant. Well developed, well nourished and in no acute distress.   HEENT: Normal.  Neck: Supple, no JVD, carotid bruits, or masses noted.  Cardiac: Regular rate and rhythm. No murmurs, rubs, or gallops. No edema.  Respiratory:  Lungs are clear to auscultation bilaterally with normal work of breathing.  GI: Soft and nontender.  MS: No deformity or atrophy. Gait and ROM intact.  Skin: Warm and dry. Color is normal.  Neuro:  Strength and sensation are intact and no gross focal deficits noted.  Psych: Alert, appropriate and with normal affect.   LABORATORY DATA:  EKG:  EKG is ordered today. This demonstrates NSR.  Lab Results  Component Value Date   WBC 7.4 12/14/2016   HGB 15.6 12/14/2016   HCT 45.4 12/14/2016   PLT 209.0 12/14/2016   GLUCOSE 103 (H) 12/14/2016   CHOL 158 12/14/2016   TRIG 95.0 12/14/2016   HDL 59.70 12/14/2016   LDLCALC 80 12/14/2016   ALT 21 12/14/2016   AST 17 12/14/2016   NA 139  12/14/2016   K 3.8 12/14/2016   CL 101 12/14/2016   CREATININE 0.98 12/14/2016   BUN 16 12/14/2016    CO2 29 12/14/2016   TSH 0.55 12/14/2016   PSA 4.02 (H) 12/14/2016   HGBA1C 5.4 12/14/2016     BNP (last 3 results) No results for input(s): BNP in the last 8760 hours.  ProBNP (last 3 results) No results for input(s): PROBNP in the last 8760 hours.   Other Studies Reviewed Today:  Echo Study Conclusions from 2013  - Left ventricle: The cavity size was normal. Wall thickness was increased in a pattern of mild LVH. Systolic function was normal. The estimated ejection fraction was in the range of 55% to 60%. Wall motion was normal; there were no regional wall motion abnormalities. Doppler parameters are consistent with abnormal left ventricular relaxation (grade 1 diastolic dysfunction). - Atrial septum: No defect or patent foramen ovale was identified.  Assessment/Plan:  1. Coronary plaque on past CT - managed with CV risk factor modification.   2. HTN - BP is doing well. Good outpatient control. No change in current therapy. I have refilled his medicine today.   3. HLD - on 3 times a week statin - labs reviewed from earlier this month. No changes made.   Current medicines are reviewed with the patient today.  The patient does not have concerns regarding medicines other than what has been noted above.  The following changes have been made:  See above.  Labs/ tests ordered today include:    Orders Placed This Encounter  Procedures  . EKG 12-Lead     Disposition:   FU with me in 1 year.   Patient is agreeable to this plan and will call if any problems develop in the interim.   SignedTruitt Merle, NP  12/25/2016 9:18 AM  [facility] [address] [place], [place]  [address] Phone: [phone] Fax: [phone]

## 2016-12-25 NOTE — Patient Instructions (Addendum)
We will be checking the following labs today - NONE   Medication Instructions:    Continue with your current medicines.   I have sent in your refills today    Testing/Procedures To Be Arranged:  N/A  Follow-Up:   See me in one year.     Other Special Instructions:   N/A    If you need a refill on your cardiac medications before your next appointment, please call your pharmacy.   Call the [facility] office at [phone] if you have any questions, problems or concerns.

## 2017-01-08 DIAGNOSIS — R3912 Poor urinary stream: Secondary | ICD-10-CM | POA: Diagnosis not present

## 2017-01-08 DIAGNOSIS — R972 Elevated prostate specific antigen [PSA]: Secondary | ICD-10-CM | POA: Diagnosis not present

## 2017-01-08 DIAGNOSIS — N401 Enlarged prostate with lower urinary tract symptoms: Secondary | ICD-10-CM | POA: Diagnosis not present

## 2017-02-08 DIAGNOSIS — R972 Elevated prostate specific antigen [PSA]: Secondary | ICD-10-CM | POA: Diagnosis not present

## 2017-03-11 ENCOUNTER — Encounter: Payer: Self-pay | Admitting: Internal Medicine

## 2017-03-11 ENCOUNTER — Ambulatory Visit (INDEPENDENT_AMBULATORY_CARE_PROVIDER_SITE_OTHER): Payer: Medicare Other | Admitting: Internal Medicine

## 2017-03-11 VITALS — BP 126/80 | HR 65 | Temp 98.3°F | Resp 16 | Wt 176.0 lb

## 2017-03-11 DIAGNOSIS — B029 Zoster without complications: Secondary | ICD-10-CM | POA: Diagnosis not present

## 2017-03-11 MED ORDER — VALACYCLOVIR HCL 1 G PO TABS: 1000.0000 mg | ORAL_TABLET | Freq: Three times a day (TID) | ORAL | 0 refills | Status: DC

## 2017-03-11 NOTE — Progress Notes (Signed)
Subjective:    Patient ID: Nicholas Paul, male    DOB: 08/24/1950, 66 y.o.   MRN: [id number]  HPI He is here for an acute visit.   Few days age he started to have pain on his right posterior lateral chest.  The area felt sensitive and after a couple of days he developed a rash. The rash spread from the front of his chest to his spine.  He has felt a little achey all over, but it is minor.  He denies fever.     Medications and allergies reviewed with patient and updated if appropriate.  Patient Active Problem List   Diagnosis Date Noted  . Urinary frequency 12/14/2016  . Hyperglycemia 09/03/2015  . Gilbert syndrome 07/30/2011  . Pulmonary nodule 05/21/2011  . ARTHRALGIA 07/14/2010  . LOW BACK PAIN SYNDROME 07/14/2010  . Irritable bowel syndrome 04/04/2010  . Actinic keratosis 10/20/2009  . History of colonic polyps 10/20/2009  . HEMATURIA, MICROSCOPIC, HX OF 10/20/2009  . Hyperlipidemia 07/31/2007  . Essential hypertension 07/31/2007    Current Outpatient Prescriptions on File Prior to Visit  Medication Sig Dispense Refill  . amLODipine (NORVASC) 5 MG tablet Take 1 tablet (5 mg total) by mouth daily. 90 tablet 3  . aspirin 81 MG tablet Take 81 mg by mouth daily.      Marland Kitchen atorvastatin (LIPITOR) 10 MG tablet TAKE 10 MG BY MOUTH ON MONDAYS, WEDNESDAYS, AND FRIDAYS ONLY. 45 tablet 3  . Coenzyme Q10 (CO Q 10 PO) Take 1 capsule by mouth daily.    . famotidine (PEPCID AC) 10 MG chewable tablet Chew 10 mg by mouth as needed. For heartburn.    . hydrochlorothiazide (MICROZIDE) 12.5 MG capsule Take 1 capsule (12.5 mg total) by mouth daily. 90 capsule 3  . loratadine (CLARITIN) 10 MG tablet Take 1 tablet (10 mg total) by mouth daily. -- Office visit needed for further refills 30 tablet 0  . Omega-3 Fatty Acids (OMEGA 3 PO) Take by mouth daily.    . psyllium (METAMUCIL) 58.6 % packet Take 2 packets by mouth 2 (two) times daily.     . quinapril (ACCUPRIL) 40 MG tablet TAKE 1 TABLET BY  MOUTH ONCE DAILY 90 tablet 3  . sildenafil (VIAGRA) 100 MG tablet Take 1 tablet (100 mg total) by mouth daily as needed for erectile dysfunction. 5 tablet 11   No current facility-administered medications on file prior to visit.     Past Medical History:  Diagnosis Date  . Adenomatous colon polyp 2006                                                                                                     X3 , all < 30mm  . Chest pain    ER visit 12/12 with coronary CT showing 50% soft stenosis in the RCA with otherwise patent vessels and calcium score of 0  . Coronary artery disease    soft plaque with 50% narrowing R mid coronary artery  . GERD (gastroesophageal reflux disease)   . Gilbert's syndrome   .  H/O: rheumatic fever    noted after tonsillectomy  . Hematuria, microscopic   . Hyperlipidemia   . Hypertension   . IBS (irritable bowel syndrome)    Dr Carlean Purl  . Internal hemorrhoids   . Prostatitis   . Pulmonary nodule    4 mm RML nodule on CT scan in 12/12. Needs follow up in one year.     Past Surgical History:  Procedure Laterality Date  . COLONOSCOPY W/ POLYPECTOMY  2006   internal hemorrhoids 2011  . CYSTOSCOPY     X4 , Dr Terance Hart; last 2011  . TONSILLECTOMY AND ADENOIDECTOMY    . VASECTOMY      Social History   Social History  . Marital status: Married    Spouse name: N/A  . Number of children: 2  . Years of education: N/A   Occupational History  .  [employer]   Social History Main Topics  . Smoking status: Never Smoker  . Smokeless tobacco: Never Used  . Alcohol use 4.8 oz/week    3 Glasses of wine, 5 Cans of beer per week     Comment: social  . Drug use: No  . Sexual activity: Yes   Other Topics Concern  . None   Social History Narrative  . None    Family History  Problem Relation Age of Onset  . Cancer Father        bladder cancer  . Cancer Brother        bladder cancer  . Hypertension Brother   . Cancer Paternal Aunt         bladder cancer  . Hyperlipidemia Mother   . Hypertension Mother   . Stroke Neg Hx   . Diabetes Neg Hx   . Heart attack Neg Hx   . Colon cancer Neg Hx     Review of Systems  Constitutional: Negative for fever.  Musculoskeletal: Positive for myalgias (mild).  Skin: Positive for rash.  Neurological: Negative for light-headedness, numbness and headaches.       Objective:   Vitals:   03/11/17 1608  BP: 126/80  Pulse: 65  Resp: 16  Temp: 98.3 F (36.8 C)  SpO2: 98%   Filed Weights   03/11/17 1608  Weight: 176 lb (79.8 kg)   Body mass index is 25.99 kg/m.  Wt Readings from Last 3 Encounters:  03/11/17 176 lb (79.8 kg)  12/25/16 179 lb 9.6 oz (81.5 kg)  12/14/16 178 lb (80.7 kg)     Physical Exam  Constitutional: He appears well-developed and well-nourished. No distress.  Skin: Rash (right mid chest - clusters of blisters/vesicles from mid chest to thoracic spine, no surrounding erythema) noted. He is not diaphoretic.          Assessment & Plan:   See Problem List for Assessment and Plan of chronic medical problems.

## 2017-03-11 NOTE — Patient Instructions (Signed)
Take the valtrex as prescribed.   Call if no improvement     Shingles Shingles, which is also known as herpes zoster, is an infection that causes a painful skin rash and fluid-filled blisters. Shingles is not related to genital herpes, which is a sexually transmitted infection. Shingles only develops in people who:  Have had chickenpox.  Have received the chickenpox vaccine. (This is rare.)  What are the causes? Shingles is caused by varicella-zoster virus (VZV). This is the same virus that causes chickenpox. After exposure to VZV, the virus stays in the body in an inactive (dormant) state. Shingles develops if the virus reactivates. This can happen many years after the initial exposure to VZV. It is not known what causes this virus to reactivate. What increases the risk? People who have had chickenpox or received the chickenpox vaccine are at risk for shingles. Infection is more common in people who:  Are older than age 79.  Have a weakened defense (immune) system, such as those with HIV, AIDS, or cancer.  Are taking medicines that weaken the immune system, such as transplant medicines.  Are under great stress.  What are the signs or symptoms? Early symptoms of this condition include itching, tingling, and pain in an area on your skin. Pain may be described as burning, stabbing, or throbbing. A few days or weeks after symptoms start, a painful red rash appears, usually on one side of the body in a bandlike or beltlike pattern. The rash eventually turns into fluid-filled blisters that break open, scab over, and dry up in about 2-3 weeks. At any time during the infection, you may also develop:  A fever.  Chills.  A headache.  An upset stomach.  How is this diagnosed? This condition is diagnosed with a skin exam. Sometimes, skin or fluid samples are taken from the blisters before a diagnosis is made. These samples are examined under a microscope or sent to a lab for testing. How  is this treated? There is no specific cure for this condition. Your health care provider will probably prescribe medicines to help you manage pain, recover more quickly, and avoid long-term problems. Medicines may include:  Antiviral drugs.  Anti-inflammatory drugs.  Pain medicines.  If the area involved is on your face, you may be referred to a specialist, such as an eye doctor (ophthalmologist) or an ear, nose, and throat (ENT) doctor to help you avoid eye problems, chronic pain, or disability. Follow these instructions at home: Medicines  Take medicines only as directed by your health care provider.  Apply an anti-itch or numbing cream to the affected area as directed by your health care provider. Blister and Rash Care  Take a cool bath or apply cool compresses to the area of the rash or blisters as directed by your health care provider. This may help with pain and itching.  Keep your rash covered with a loose bandage (dressing). Wear loose-fitting clothing to help ease the pain of material rubbing against the rash.  Keep your rash and blisters clean with mild soap and cool water or as directed by your health care provider.  Check your rash every day for signs of infection. These include redness, swelling, and pain that lasts or increases.  Do not pick your blisters.  Do not scratch your rash. General instructions  Rest as directed by your health care provider.  Keep all follow-up visits as directed by your health care provider. This is important.  Until your blisters scab over,  your infection can cause chickenpox in people who have never had it or been vaccinated against it. To prevent this from happening, avoid contact with other people, especially: ? Babies. ? Pregnant women. ? Children who have eczema. ? Elderly people who have transplants. ? People who have chronic illnesses, such as leukemia or AIDS. Contact a health care provider if:  Your pain is not relieved  with prescribed medicines.  Your pain does not get better after the rash heals.  Your rash looks infected. Signs of infection include redness, swelling, and pain that lasts or increases. Get help right away if:  The rash is on your face or nose.  You have facial pain, pain around your eye area, or loss of feeling on one side of your face.  You have ear pain or you have ringing in your ear.  You have loss of taste.  Your condition gets worse. This information is not intended to replace advice given to you by your health care provider. Make sure you discuss any questions you have with your health care provider. Document Released: 04/30/2005 Document Revised: 12/25/2015 Document Reviewed: 03/11/2014 Elsevier Interactive Patient Education  2017 [employer].

## 2017-03-11 NOTE — Assessment & Plan Note (Signed)
Rash consistent with shingles Start valtrex Symptomatic treatment for pain - he will call if pain increases Will get shingles vaccine once rash has completely cleared Call if no improvement

## 2017-03-21 DIAGNOSIS — L814 Other melanin hyperpigmentation: Secondary | ICD-10-CM | POA: Diagnosis not present

## 2017-03-21 DIAGNOSIS — L821 Other seborrheic keratosis: Secondary | ICD-10-CM | POA: Diagnosis not present

## 2017-03-21 DIAGNOSIS — L570 Actinic keratosis: Secondary | ICD-10-CM | POA: Diagnosis not present

## 2017-03-21 DIAGNOSIS — L308 Other specified dermatitis: Secondary | ICD-10-CM | POA: Diagnosis not present

## 2017-03-21 DIAGNOSIS — D229 Melanocytic nevi, unspecified: Secondary | ICD-10-CM | POA: Diagnosis not present

## 2017-04-09 DIAGNOSIS — R3911 Hesitancy of micturition: Secondary | ICD-10-CM | POA: Diagnosis not present

## 2017-04-09 DIAGNOSIS — N401 Enlarged prostate with lower urinary tract symptoms: Secondary | ICD-10-CM | POA: Diagnosis not present

## 2017-04-09 DIAGNOSIS — R972 Elevated prostate specific antigen [PSA]: Secondary | ICD-10-CM | POA: Diagnosis not present

## 2017-04-19 ENCOUNTER — Encounter: Payer: Self-pay | Admitting: Family

## 2017-04-19 ENCOUNTER — Ambulatory Visit (INDEPENDENT_AMBULATORY_CARE_PROVIDER_SITE_OTHER): Payer: Medicare Other | Admitting: Family

## 2017-04-19 VITALS — BP 116/76 | HR 95 | Temp 98.2°F | Ht 69.0 in | Wt 180.0 lb

## 2017-04-19 DIAGNOSIS — J019 Acute sinusitis, unspecified: Secondary | ICD-10-CM | POA: Diagnosis not present

## 2017-04-19 MED ORDER — AMOXICILLIN-POT CLAVULANATE 875-125 MG PO TABS: 1.0000 | ORAL_TABLET | Freq: Two times a day (BID) | ORAL | 0 refills | Status: DC

## 2017-04-19 MED ORDER — LORATADINE 10 MG PO TABS: 10.0000 mg | ORAL_TABLET | Freq: Every day | ORAL | 6 refills | Status: DC

## 2017-04-19 MED ORDER — FLUTICASONE PROPIONATE 50 MCG/ACT NA SUSP: 2.0000 | Freq: Every day | NASAL | 6 refills | Status: DC

## 2017-04-19 NOTE — Progress Notes (Signed)
Nicholas Paul is a 66 y.o. male with the following history as recorded in [employer]:  Patient Active Problem List   Diagnosis Date Noted  . Herpes zoster without complication 19/37/9024  . Urinary frequency 12/14/2016  . Hyperglycemia 09/03/2015  . Gilbert syndrome 07/30/2011  . Pulmonary nodule 05/21/2011  . ARTHRALGIA 07/14/2010  . LOW BACK PAIN SYNDROME 07/14/2010  . Irritable bowel syndrome 04/04/2010  . Actinic keratosis 10/20/2009  . History of colonic polyps 10/20/2009  . HEMATURIA, MICROSCOPIC, HX OF 10/20/2009  . Hyperlipidemia 07/31/2007  . Essential hypertension 07/31/2007    Current Outpatient Medications  Medication Sig Dispense Refill  . amLODipine (NORVASC) 5 MG tablet Take 1 tablet (5 mg total) by mouth daily. 90 tablet 3  . aspirin 81 MG tablet Take 81 mg by mouth daily.      Marland Kitchen atorvastatin (LIPITOR) 10 MG tablet TAKE 10 MG BY MOUTH ON MONDAYS, WEDNESDAYS, AND FRIDAYS ONLY. 45 tablet 3  . Coenzyme Q10 (CO Q 10 PO) Take 1 capsule by mouth daily.    . famotidine (PEPCID AC) 10 MG chewable tablet Chew 10 mg by mouth as needed. For heartburn.    . hydrochlorothiazide (MICROZIDE) 12.5 MG capsule Take 1 capsule (12.5 mg total) by mouth daily. 90 capsule 3  . loratadine (CLARITIN) 10 MG tablet Take 1 tablet (10 mg total) by mouth daily. -- Office visit needed for further refills 30 tablet 6  . Omega-3 Fatty Acids (OMEGA 3 PO) Take by mouth daily.    . psyllium (METAMUCIL) 58.6 % packet Take 2 packets by mouth 2 (two) times daily.     . quinapril (ACCUPRIL) 40 MG tablet TAKE 1 TABLET BY MOUTH ONCE DAILY 90 tablet 3  . tamsulosin (FLOMAX) 0.4 MG CAPS capsule TAKE 1 CAPSULE BY MOUTH EVERYDAY AT BEDTIME  11  . valACYclovir (VALTREX) 1000 MG tablet Take 1 tablet (1,000 mg total) by mouth 3 (three) times daily. 21 tablet 0  . amoxicillin-clavulanate (AUGMENTIN) 875-125 MG tablet Take 1 tablet by mouth 2 (two) times daily. 20 tablet 0  . fluticasone (FLONASE) 50 MCG/ACT nasal  spray Place 2 sprays into both nostrils daily. 16 g 6  . sildenafil (VIAGRA) 100 MG tablet Take 1 tablet (100 mg total) by mouth daily as needed for erectile dysfunction. 5 tablet 11   No current facility-administered medications for this visit.     Allergies: Doxazosin mesylate and Crestor [rosuvastatin]  Past Medical History:  Diagnosis Date  . Adenomatous colon polyp 2006                                                                                                     X3 , all < 30mm  . Chest pain    ER visit 12/12 with coronary CT showing 50% soft stenosis in the RCA with otherwise patent vessels and calcium score of 0  . Coronary artery disease    soft plaque with 50% narrowing R mid coronary artery  . GERD (gastroesophageal reflux disease)   . Gilbert's syndrome   . H/O: rheumatic fever  noted after tonsillectomy  . Hematuria, microscopic   . Hyperlipidemia   . Hypertension   . IBS (irritable bowel syndrome)    Dr Carlean Purl  . Internal hemorrhoids   . Prostatitis   . Pulmonary nodule    4 mm RML nodule on CT scan in 12/12. Needs follow up in one year.     Past Surgical History:  Procedure Laterality Date  . COLONOSCOPY W/ POLYPECTOMY  2006   internal hemorrhoids 2011  . CYSTOSCOPY     X4 , Dr Terance Hart; last 2011  . TONSILLECTOMY AND ADENOIDECTOMY    . VASECTOMY      Family History  Problem Relation Age of Onset  . Cancer Father        bladder cancer  . Cancer Brother        bladder cancer  . Hypertension Brother   . Cancer Paternal Aunt        bladder cancer  . Hyperlipidemia Mother   . Hypertension Mother   . Stroke Neg Hx   . Diabetes Neg Hx   . Heart attack Neg Hx   . Colon cancer Neg Hx     Social History   Tobacco Use  . Smoking status: Never Smoker  . Smokeless tobacco: Never Used  Substance Use Topics  . Alcohol use: Yes    Alcohol/week: 4.8 oz    Types: 3 Glasses of wine, 5 Cans of beer per week    Comment: social    Subjective:  3 week  history of sinus pain/ pressure; feels the drainage in back of throat; no fever; + productive cough; using OTC Mucinex and Claritin; complaining of pressure behind eyes; does typically get one sinus infection per year; wife was sick with similar symptoms a few weeks ago;  Objective:  Vitals:   04/19/17 1423  BP: 116/76  Pulse: 95  Temp: 98.2 F (36.8 C)  TempSrc: Oral  SpO2: 99%  Weight: 180 lb (81.6 kg)  Height: 5\' 9"  (1.753 m)    General: Well developed, well nourished, in no acute distress  Skin : Warm and dry.  Head: Normocephalic and atraumatic  Eyes: Sclera and conjunctiva clear; pupils round and reactive to light; extraocular movements intact  Ears: External normal; canals clear; tympanic membranes congested bilaterally/ R TM mildly erythematous Oropharynx: Pink, supple. No suspicious lesions  Neck: Supple without thyromegaly, adenopathy  Lungs: Respirations unlabored; clear to auscultation bilaterally without wheeze, rales, rhonchi  CVS exam: normal rate and regular rhythm.  Neurologic: Alert and oriented; speech intact; face symmetrical; moves all extremities well; CNII-XII intact without focal deficit   Assessment:  1. Acute sinusitis, recurrence not specified, unspecified location     Plan:  Rx for Augmentin 875 mg bid x 10 days; refill given on Loratidine 10 mg; Rx for Flonase; increase fluids, rest and follow-up worse, no better.   No Follow-up on file.  No orders of the defined types were placed in this encounter.   Requested Prescriptions   Signed Prescriptions Disp Refills  . fluticasone (FLONASE) 50 MCG/ACT nasal spray 16 g 6    Sig: Place 2 sprays into both nostrils daily.  Marland Kitchen amoxicillin-clavulanate (AUGMENTIN) 875-125 MG tablet 20 tablet 0    Sig: Take 1 tablet by mouth 2 (two) times daily.  Marland Kitchen loratadine (CLARITIN) 10 MG tablet 30 tablet 6    Sig: Take 1 tablet (10 mg total) by mouth daily. -- Office visit needed for further refills

## 2017-07-15 DIAGNOSIS — N401 Enlarged prostate with lower urinary tract symptoms: Secondary | ICD-10-CM | POA: Diagnosis not present

## 2017-08-27 ENCOUNTER — Encounter: Payer: Self-pay | Admitting: Neurology

## 2017-08-27 ENCOUNTER — Telehealth: Payer: Self-pay | Admitting: *Deleted

## 2017-08-27 ENCOUNTER — Ambulatory Visit: Payer: Medicaid Other | Admitting: Neurology

## 2017-08-27 VITALS — BP 89/66 | HR 90 | Ht 74.0 in

## 2017-08-27 DIAGNOSIS — G894 Chronic pain syndrome: Secondary | ICD-10-CM | POA: Diagnosis not present

## 2017-08-27 DIAGNOSIS — G8250 Quadriplegia, unspecified: Secondary | ICD-10-CM | POA: Diagnosis not present

## 2017-08-27 DIAGNOSIS — G959 Disease of spinal cord, unspecified: Secondary | ICD-10-CM | POA: Diagnosis not present

## 2017-08-27 DIAGNOSIS — N319 Neuromuscular dysfunction of bladder, unspecified: Secondary | ICD-10-CM

## 2017-08-27 MED ORDER — DIAZEPAM 5 MG PO TABS: 5.0000 mg | ORAL_TABLET | Freq: Four times a day (QID) | ORAL | 0 refills | Status: DC

## 2017-08-27 MED ORDER — GABAPENTIN 300 MG PO CAPS: 300.0000 mg | ORAL_CAPSULE | Freq: Three times a day (TID) | ORAL | 3 refills | Status: DC

## 2017-08-27 MED ORDER — OXYCODONE HCL 15 MG PO TABS: 15.0000 mg | ORAL_TABLET | Freq: Four times a day (QID) | ORAL | 0 refills | Status: DC

## 2017-08-27 MED ORDER — DIAZEPAM 5 MG PO TABS: 5.0000 mg | ORAL_TABLET | Freq: Four times a day (QID) | ORAL | 1 refills | Status: DC

## 2017-08-27 NOTE — Telephone Encounter (Signed)
Initiated PA oxycodone 15mg  tab. Printed short-acting opiod form from [employer]. Waiting for OV note to be closed and will then fax completed/signed PA form.
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
## 2017-08-27 NOTE — Patient Instructions (Signed)
   We will start gabapentin 300 mg taking one twice a day for 2 weeks, then take one three times a day.  Neurontin (gabapentin) may result in drowsiness, ankle swelling, gait instability, or possibly dizziness. Please contact our office if significant side effects occur with this medication.

## 2017-08-27 NOTE — Telephone Encounter (Signed)
Received request from [facility] to complete PA for diazepam. I called [facility] at [phone] and spoke with Joellen Jersey who advised no PA needed for rx diazepam. That is on their formulary. May be rejecting at pharmacy since he takes in combination w/ opioid. Int ID# for call:   I called pharmacy and spoke w/ Marjory Lies. Relayed above info. He will try to get an override. Advised he takes oxycodone for chronic pain and diazepam for spasticity. He will call back if any further questions or concerns.

## 2017-08-27 NOTE — Telephone Encounter (Signed)
Faxed completed/signed PA to [facility] at [phone]. Received fax confirmation. Waiting on determination.

## 2017-08-28 NOTE — Telephone Encounter (Signed)
PA approved effective 08/27/17-09/26/17. W[place]#[id number]. Faxed notice of approval to [place] at [phone]. Received fax confirmation.

## 2017-09-16 DIAGNOSIS — L570 Actinic keratosis: Secondary | ICD-10-CM | POA: Diagnosis not present

## 2017-09-16 DIAGNOSIS — D1801 Hemangioma of skin and subcutaneous tissue: Secondary | ICD-10-CM | POA: Diagnosis not present

## 2017-09-16 DIAGNOSIS — Z85828 Personal history of other malignant neoplasm of skin: Secondary | ICD-10-CM | POA: Diagnosis not present

## 2017-09-16 DIAGNOSIS — L821 Other seborrheic keratosis: Secondary | ICD-10-CM | POA: Diagnosis not present

## 2017-09-16 DIAGNOSIS — L814 Other melanin hyperpigmentation: Secondary | ICD-10-CM | POA: Diagnosis not present

## 2017-09-27 DIAGNOSIS — N401 Enlarged prostate with lower urinary tract symptoms: Secondary | ICD-10-CM | POA: Diagnosis not present

## 2017-10-03 DIAGNOSIS — N401 Enlarged prostate with lower urinary tract symptoms: Secondary | ICD-10-CM | POA: Diagnosis not present

## 2017-10-03 DIAGNOSIS — R3912 Poor urinary stream: Secondary | ICD-10-CM | POA: Diagnosis not present

## 2017-12-24 ENCOUNTER — Ambulatory Visit (INDEPENDENT_AMBULATORY_CARE_PROVIDER_SITE_OTHER): Payer: Medicare Other | Admitting: Nurse Practitioner

## 2017-12-24 ENCOUNTER — Encounter: Payer: Self-pay | Admitting: Nurse Practitioner

## 2017-12-24 VITALS — BP 110/68 | HR 84 | Ht 69.0 in | Wt 180.8 lb

## 2017-12-24 DIAGNOSIS — I2583 Coronary atherosclerosis due to lipid rich plaque: Secondary | ICD-10-CM

## 2017-12-24 DIAGNOSIS — E7849 Other hyperlipidemia: Secondary | ICD-10-CM

## 2017-12-24 DIAGNOSIS — I2510 Atherosclerotic heart disease of native coronary artery without angina pectoris: Secondary | ICD-10-CM | POA: Diagnosis not present

## 2017-12-24 DIAGNOSIS — I10 Essential (primary) hypertension: Secondary | ICD-10-CM | POA: Diagnosis not present

## 2017-12-24 LAB — HEPATIC FUNCTION PANEL
ALT: 17 IU/L (ref 0–44)
AST: 14 IU/L (ref 0–40)
Albumin: 4.4 g/dL (ref 3.6–4.8)
Alkaline Phosphatase: 76 IU/L (ref 39–117)
Bilirubin Total: 1.7 mg/dL — ABNORMAL HIGH (ref 0.0–1.2)
Bilirubin, Direct: 0.34 mg/dL (ref 0.00–0.40)
Total Protein: 6.9 g/dL (ref 6.0–8.5)

## 2017-12-24 LAB — CBC
Hematocrit: 43.7 % (ref 37.5–51.0)
Hemoglobin: 15 g/dL (ref 13.0–17.7)
MCH: 30 pg (ref 26.6–33.0)
MCHC: 34.3 g/dL (ref 31.5–35.7)
MCV: 87 fL (ref 79–97)
Platelets: 203 10*3/uL (ref 150–450)
RBC: 5 x10E6/uL (ref 4.14–5.80)
RDW: 12.5 % (ref 12.3–15.4)
WBC: 5.3 10*3/uL (ref 3.4–10.8)

## 2017-12-24 LAB — BASIC METABOLIC PANEL
BUN/Creatinine Ratio: 10 (ref 10–24)
BUN: 11 mg/dL (ref 8–27)
CO2: 25 mmol/L (ref 20–29)
Calcium: 9.2 mg/dL (ref 8.6–10.2)
Chloride: 100 mmol/L (ref 96–106)
Creatinine, Ser: 1.08 mg/dL (ref 0.76–1.27)
GFR calc Af Amer: 81 mL/min/{1.73_m2} (ref >59)
GFR calc non Af Amer: 70 mL/min/{1.73_m2} (ref >59)
Glucose: 107 mg/dL — ABNORMAL HIGH (ref 65–99)
Potassium: 4.5 mmol/L (ref 3.5–5.2)
Sodium: 138 mmol/L (ref 134–144)

## 2017-12-24 LAB — LIPID PANEL
Chol/HDL Ratio: 2.5 ratio (ref 0.0–5.0)
Cholesterol, Total: 155 mg/dL (ref 100–199)
HDL: 62 mg/dL (ref >39)
LDL Calculated: 79 mg/dL (ref 0–99)
Triglycerides: 72 mg/dL (ref 0–149)
VLDL Cholesterol Cal: 14 mg/dL (ref 5–40)

## 2017-12-24 MED ORDER — METOPROLOL TARTRATE 50 MG PO TABS: 50.0000 mg | ORAL_TABLET | ORAL | 0 refills | Status: DC

## 2017-12-24 NOTE — Progress Notes (Signed)
CARDIOLOGY OFFICE NOTE  Date:  12/24/2017    Nicholas Paul Date of Birth: Jan 06, 1951 Medical Record #[id number]  PCP:  Nicholas Rail, MD  Cardiologist:  Nicholas Paul    Chief Complaint  Patient presents with  . Hypertension  . Hyperlipidemia    1 year check.     History of Present Illness: Nicholas Paul is a 67 y.o. male who presents today for a one year check. Former patient of Nicholas. Sherryl Paul. He is following with me.   He has had prior coronary CT showing soft plaque in the RCA in 2012 and a calcium score of 0. Has HTN and HLD.  Strong FH for bladder cancer. Has had a past lung nodule noted(4 mm) as well by CT in 2012 - but with no risk factors, no further follow up felt to be needed per radiology and after discussion with Nicholas. Mare Paul.   Last seen in August of 2018. Was doing well. Had had some blood in his urine and PSA had creeped up.   Comes back today. Here alone. He is doing ok. No chest pain. He has gotten lax with his exercise. Has gained a little weight. Little more alcohol use in the summer. Tolerating his medicines. Followed by [facility]. Continues to work. He is not taking his aspirin due to the recent reports. 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    Past Medical History:  Diagnosis Date  . Adenomatous colon polyp 2006                                                                                                     X3 , all < 67mm  . Chest pain    ER visit 12/12 with coronary CT showing 50% soft stenosis in the RCA with otherwise patent vessels and calcium score of 0  . Coronary artery disease    soft plaque with 50% narrowing R mid coronary artery  . GERD (gastroesophageal reflux disease)   . Gilbert's syndrome   . H/O: rheumatic fever    noted after tonsillectomy  . Hematuria, microscopic   . Hyperlipidemia   . Hypertension   . IBS (irritable bowel syndrome)    Nicholas Paul  . Internal hemorrhoids   . Prostatitis    . Pulmonary nodule    4 mm RML nodule on CT scan in 12/12. Needs follow up in one year.     Past Surgical History:  Procedure Laterality Date  . COLONOSCOPY W/ POLYPECTOMY  2006   internal hemorrhoids 2011  . CYSTOSCOPY     X4 , Nicholas Paul; last 2011  . TONSILLECTOMY AND ADENOIDECTOMY    . VASECTOMY       Medications: Current Meds  Medication Sig  . amLODipine (NORVASC) 5 MG tablet Take 1 tablet (5 mg total) by mouth daily.  Marland Kitchen atorvastatin (LIPITOR) 10 MG tablet TAKE 10 MG BY MOUTH ON MONDAYS, WEDNESDAYS, AND FRIDAYS ONLY.  Marland Kitchen Coenzyme Q10 (CO Q 10 PO) Take 1 capsule by mouth daily.  Marland Kitchen  famotidine (PEPCID AC) 10 MG chewable tablet Chew 10 mg by mouth as needed. For heartburn.  . fluticasone (FLONASE) 50 MCG/ACT nasal spray Place 2 sprays into both nostrils daily.  . hydrochlorothiazide (MICROZIDE) 12.5 MG capsule Take 1 capsule (12.5 mg total) by mouth daily.  Marland Kitchen loratadine (CLARITIN) 10 MG tablet Take 1 tablet (10 mg total) by mouth daily. -- Office visit needed for further refills  . Omega-3 Fatty Acids (OMEGA 3 PO) Take by mouth daily.  . psyllium (METAMUCIL) 58.6 % packet Take 2 packets by mouth 2 (two) times daily.   . quinapril (ACCUPRIL) 40 MG tablet TAKE 1 TABLET BY MOUTH ONCE DAILY  . tamsulosin (FLOMAX) 0.4 MG CAPS capsule TAKE 1 CAPSULE BY MOUTH EVERYDAY AT BEDTIME  . valACYclovir (VALTREX) 1000 MG tablet Take 1 tablet (1,000 mg total) by mouth 3 (three) times daily.     Allergies: Allergies  Allergen Reactions  . Doxazosin Mesylate     REACTION: edema on the generic.  Swelling around eyes per pt. maw  . Crestor [Rosuvastatin]     Crestor 5 mg qd caused leg myalgias ; no issue with Lipitor 10 mg M, W, & F    Social History: The patient  reports that he has never smoked. He has never used smokeless tobacco. He reports that he drinks about 8.0 standard drinks of alcohol per week. He reports that he does not use drugs.   Family History: The patient's family  history includes Cancer in his brother, father, and paternal aunt; Hyperlipidemia in his mother; Hypertension in his brother and mother.   Review of Systems: Please see the history of present illness.   Otherwise, the review of systems is positive for none.   All other systems are reviewed and negative.   Physical Exam: VS:  BP 110/68 (BP Location: Left Arm, Patient Position: Sitting, Cuff Size: Normal)   Pulse 84   Ht 5\' 9"  (1.753 m)   Wt 180 lb 12.8 oz (82 kg)   BMI 26.70 kg/m  .  BMI Body mass index is 26.7 kg/m.  Wt Readings from Last 3 Encounters:  12/24/17 180 lb 12.8 oz (82 kg)  04/19/17 180 lb (81.6 kg)  03/11/17 176 lb (79.8 kg)    General: Pleasant. Well developed, well nourished and in no acute distress.   HEENT: Normal.  Neck: Supple, no JVD, carotid bruits, or masses noted.  Cardiac: Regular rate and rhythm. No murmurs, rubs, or gallops. No edema.  Respiratory:  Lungs are clear to auscultation bilaterally with normal work of breathing.  GI: Soft and nontender.  MS: No deformity or atrophy. Gait and ROM intact.  Skin: Warm and dry. Color is normal.  Neuro:  Strength and sensation are intact and no gross focal deficits noted.  Psych: Alert, appropriate and with normal affect.   LABORATORY DATA:  EKG:  EKG is ordered today. This demonstrates NSR - his HR is 84.  Lab Results  Component Value Date   WBC 7.4 12/14/2016   HGB 15.6 12/14/2016   HCT 45.4 12/14/2016   PLT 209.0 12/14/2016   GLUCOSE 103 (H) 12/14/2016   CHOL 158 12/14/2016   TRIG 95.0 12/14/2016   HDL 59.70 12/14/2016   LDLCALC 80 12/14/2016   ALT 21 12/14/2016   AST 17 12/14/2016   NA 139 12/14/2016   K 3.8 12/14/2016   CL 101 12/14/2016   CREATININE 0.98 12/14/2016   BUN 16 12/14/2016   CO2 29 12/14/2016   TSH 0.55  12/14/2016   PSA 4.02 (H) 12/14/2016   HGBA1C 5.4 12/14/2016     BNP (last 3 results) No results for input(s): BNP in the last 8760 hours.  ProBNP (last 3 results) No  results for input(s): PROBNP in the last 8760 hours.   Other Studies Reviewed Today:  Echo Study Conclusions from 2013  - Left ventricle: The cavity size was normal. Wall thickness was increased in a pattern of mild LVH. Systolic function was normal. The estimated ejection fraction was in the range of 55% to 60%. Wall motion was normal; there were no regional wall motion abnormalities. Doppler parameters are consistent with abnormal left ventricular relaxation (grade 1 diastolic dysfunction). - Atrial septum: No defect or patent foramen ovale was identified.  Assessment/Plan:  1. Coronary plaque on past CT - managed with CV risk factor modification. Discussed at length today. This study was from 2012 - he had soft plaque noted at that time - we have discussed updating and he is agreeable. He needs to get back on track with CV risk factor modification. Lab today. He will need a dose of Lopressor the night prior and the morning of his study. Will tentatively see back in a year unless abnormal.   2. HTN - BP looks good. Tolerating his current regimen.    3. HLD - on 3 times a week statin due to tolerability issues - we are checking labs today.   Current medicines are reviewed with the patient today.  The patient does not have concerns regarding medicines other than what has been noted above.  The following changes have been made:  See above.  Labs/ tests ordered today include:    Orders Placed This Encounter  Procedures  . EKG 12-Lead     Disposition:   FU with me in 1 year.   Patient is agreeable to this plan and will call if any problems develop in the interim.   SignedTruitt Merle, NP  12/24/2017 9:22 AM  [facility] [address] [place], [place]  [address] Phone: [phone] Fax: [phone]

## 2017-12-24 NOTE — Patient Instructions (Addendum)
We will be checking the following labs today - BMET, CBC, HPF and lipids   Medication Instructions:    Continue with your current medicines. BUT  I am giving you a RX for Lopressor to take the night prior to the CT and one hour prior to the CT on the day of the study   Testing/Procedures To Be Arranged:  Coronary CT  Follow-Up:   See me in one year     Other Special Instructions:   Please arrive at the [facility] main entrance of [facility] at xx:xx AM (30-45 minutes prior to test start time)  [facility] [place], [place] [address] [phone]  Proceed to the [facility] Radiology Department (First Floor).  Please follow these instructions carefully (unless otherwise directed):  Hold all erectile dysfunction medications at least 48 hours prior to test.  On the Night Before the Test: Drink plenty of water. Do not consume any caffeinated/decaffeinated beverages or chocolate 12 hours prior to your test. Do not take any antihistamines 12 hours prior to your test.    On the Day of the Test: Drink plenty of water. Do not drink any water within one hour of the test. Do not eat any food 4 hours prior to the test. You may take your regular medications prior to the test. IF NOT ON A BETA BLOCKER - Take 50 mg of lopressor (metoprolol) at bedtime the night prior to the CT and repeat one hour before the test. HOLD Microzide the morning of the test.  After the Test: Drink plenty of water. After receiving IV contrast, you may experience a mild flushed feeling. This is normal. On occasion, you may experience a mild rash up to 24 hours after the test. This is not dangerous. If this occurs, you can take Benadryl 25 mg and increase your fluid intake. If you experience trouble breathing, this can be serious. If it is severe call 911 IMMEDIATELY. If it is mild, please call our office. If you take any of these medications: Glipizide/Metformin,  Avandament, Glucavance, please do not take 48 hours after completing test.      If you need a refill on your cardiac medications before your next appointment, please call your pharmacy.   Call the [place] office at [phone] if you have any questions, problems or concerns.

## 2017-12-28 ENCOUNTER — Other Ambulatory Visit: Payer: Self-pay | Admitting: Nurse Practitioner

## 2018-01-07 ENCOUNTER — Other Ambulatory Visit: Payer: Self-pay | Admitting: Nurse Practitioner

## 2018-01-31 ENCOUNTER — Other Ambulatory Visit: Payer: Self-pay | Admitting: *Deleted

## 2018-01-31 ENCOUNTER — Other Ambulatory Visit: Payer: Medicare Other | Admitting: *Deleted

## 2018-01-31 DIAGNOSIS — I10 Essential (primary) hypertension: Secondary | ICD-10-CM

## 2018-01-31 LAB — BASIC METABOLIC PANEL
BUN/Creatinine Ratio: 10 (ref 10–24)
BUN: 11 mg/dL (ref 8–27)
CO2: 26 mmol/L (ref 20–29)
Calcium: 9.3 mg/dL (ref 8.6–10.2)
Chloride: 102 mmol/L (ref 96–106)
Creatinine, Ser: 1.07 mg/dL (ref 0.76–1.27)
GFR calc Af Amer: 82 mL/min/{1.73_m2} (ref >59)
GFR calc non Af Amer: 71 mL/min/{1.73_m2} (ref >59)
Glucose: 111 mg/dL — ABNORMAL HIGH (ref 65–99)
Potassium: 4.6 mmol/L (ref 3.5–5.2)
Sodium: 139 mmol/L (ref 134–144)

## 2018-02-17 ENCOUNTER — Ambulatory Visit (HOSPITAL_COMMUNITY): Payer: Medicare Other

## 2018-02-17 ENCOUNTER — Encounter (HOSPITAL_COMMUNITY): Payer: Self-pay

## 2018-02-17 ENCOUNTER — Ambulatory Visit (HOSPITAL_COMMUNITY)
Admission: RE | Admit: 2018-02-17 | Discharge: 2018-02-17 | Disposition: A | Discharge status: Home / Self Care | Payer: Medicare Other | Source: Ambulatory Visit | Attending: Nurse Practitioner | Admitting: Nurse Practitioner

## 2018-02-17 DIAGNOSIS — I2510 Atherosclerotic heart disease of native coronary artery without angina pectoris: Secondary | ICD-10-CM

## 2018-02-17 DIAGNOSIS — I2583 Coronary atherosclerosis due to lipid rich plaque: Secondary | ICD-10-CM | POA: Diagnosis not present

## 2018-02-17 MED ORDER — METOPROLOL TARTRATE 5 MG/5ML IV SOLN
5.0000 mg | INTRAVENOUS | Status: DC | PRN
  Administered 2018-02-17: 5 mg via INTRAVENOUS
  Filled 2018-02-17 (×2): qty 5

## 2018-02-17 MED ORDER — IOPAMIDOL (ISOVUE-370) INJECTION 76%
100.0000 mL | Freq: Once | INTRAVENOUS | Status: AC | PRN
  Administered 2018-02-17: 90 mL via INTRAVENOUS

## 2018-02-17 MED ORDER — NITROGLYCERIN 0.4 MG SL SUBL
0.8000 mg | SUBLINGUAL_TABLET | Freq: Once | SUBLINGUAL | Status: AC
  Administered 2018-02-17: 0.8 mg via SUBLINGUAL
  Filled 2018-02-17: qty 25

## 2018-02-17 MED ORDER — NITROGLYCERIN 0.4 MG SL SUBL
SUBLINGUAL_TABLET | SUBLINGUAL | Status: AC
  Administered 2018-02-17: 0.8 mg via SUBLINGUAL
  Filled 2018-02-17: qty 2

## 2018-02-17 MED ORDER — METOPROLOL TARTRATE 5 MG/5ML IV SOLN
INTRAVENOUS | Status: AC
  Administered 2018-02-17: 5 mg via INTRAVENOUS
  Filled 2018-02-17: qty 20

## 2018-02-19 ENCOUNTER — Telehealth: Payer: Self-pay | Admitting: Nurse Practitioner

## 2018-02-19 NOTE — Telephone Encounter (Signed)
New Message ° ° ° ° ° ° ° ° ° °Patient is returning your call °

## 2018-03-17 ENCOUNTER — Other Ambulatory Visit: Payer: Self-pay | Admitting: Nurse Practitioner

## 2018-03-20 DIAGNOSIS — Z85828 Personal history of other malignant neoplasm of skin: Secondary | ICD-10-CM | POA: Diagnosis not present

## 2018-03-20 DIAGNOSIS — L570 Actinic keratosis: Secondary | ICD-10-CM | POA: Diagnosis not present

## 2018-03-20 DIAGNOSIS — D229 Melanocytic nevi, unspecified: Secondary | ICD-10-CM | POA: Diagnosis not present

## 2018-03-20 DIAGNOSIS — L814 Other melanin hyperpigmentation: Secondary | ICD-10-CM | POA: Diagnosis not present

## 2018-03-20 DIAGNOSIS — L821 Other seborrheic keratosis: Secondary | ICD-10-CM | POA: Diagnosis not present

## 2018-05-23 DIAGNOSIS — Z23 Encounter for immunization: Secondary | ICD-10-CM | POA: Diagnosis not present

## 2018-06-12 ENCOUNTER — Telehealth: Payer: Self-pay | Admitting: Internal Medicine

## 2018-06-12 NOTE — Telephone Encounter (Signed)
Attempted to call patient to schedule AWV. Patient did not answer will try to call patient at a later time. SF

## 2018-07-18 DIAGNOSIS — R0981 Nasal congestion: Secondary | ICD-10-CM | POA: Diagnosis not present

## 2018-07-18 DIAGNOSIS — J018 Other acute sinusitis: Secondary | ICD-10-CM | POA: Diagnosis not present

## 2018-07-24 NOTE — Progress Notes (Signed)
Subjective:    Patient ID: Nicholas Paul, male    DOB: 21-Dec-1950, 68 y.o.   MRN: [id number]  HPI He is here for an acute visit for cold symptoms.  His symptoms started about 3 weeks ago.  1 week ago he went to urgent care and was diagnosed with a sinus infection and was prescribed amoxicillin 500 mg 3 times daily for 5 days.  He also received a steroid injection while he was there.  His last dose of antibiotic was 2 days ago.  He does feel better, but is still having symptoms and he is concerned the infection is not completely treated.  He states nasal congestion, ear pain, sinus pressure and headaches.  He has not had any fevers, chills, postnasal drip, sore throat, cough, wheeze, shortness of breath or lightheadedness.   Medications and allergies reviewed with patient and updated if appropriate.  Patient Active Problem List   Diagnosis Date Noted  . Herpes zoster without complication 45/85/9292  . Urinary frequency 12/14/2016  . Hyperglycemia 09/03/2015  . Gilbert syndrome 07/30/2011  . Pulmonary nodule 05/21/2011  . ARTHRALGIA 07/14/2010  . LOW BACK PAIN SYNDROME 07/14/2010  . Irritable bowel syndrome 04/04/2010  . Actinic keratosis 10/20/2009  . History of colonic polyps 10/20/2009  . HEMATURIA, MICROSCOPIC, HX OF 10/20/2009  . Hyperlipidemia 07/31/2007  . Essential hypertension 07/31/2007    Current Outpatient Medications on File Prior to Visit  Medication Sig Dispense Refill  . amLODipine (NORVASC) 5 MG tablet TAKE 1 TABLET BY MOUTH EVERY DAY 90 tablet 3  . atorvastatin (LIPITOR) 10 MG tablet TAKE 10 MG BY MOUTH ON MONDAYS, WEDNESDAYS, AND FRIDAYS ONLY. 45 tablet 3  . Coenzyme Q10 (CO Q 10 PO) Take 1 capsule by mouth daily.    . famotidine (PEPCID AC) 10 MG chewable tablet Chew 10 mg by mouth as needed. For heartburn.    . fluticasone (FLONASE) 50 MCG/ACT nasal spray Place 2 sprays into both nostrils daily. 16 g 6  . hydrochlorothiazide (MICROZIDE) 12.5 MG capsule  TAKE 1 CAPSULE BY MOUTH EVERY DAY 90 capsule 3  . loratadine (CLARITIN) 10 MG tablet Take 1 tablet (10 mg total) by mouth daily. -- Office visit needed for further refills 30 tablet 6  . Omega-3 Fatty Acids (OMEGA 3 PO) Take by mouth daily.    . psyllium (METAMUCIL) 58.6 % packet Take 2 packets by mouth 2 (two) times daily.     . quinapril (ACCUPRIL) 40 MG tablet TAKE 1 TABLET BY MOUTH EVERY DAY 90 tablet 3  . tamsulosin (FLOMAX) 0.4 MG CAPS capsule TAKE 1 CAPSULE BY MOUTH EVERYDAY AT BEDTIME  11  . valACYclovir (VALTREX) 1000 MG tablet Take 1 tablet (1,000 mg total) by mouth 3 (three) times daily. 21 tablet 0  . metoprolol tartrate (LOPRESSOR) 50 MG tablet Take 1 tablet (50 mg total) by mouth as directed. Take one dose at bedtime the night prior to CT scan. Take the remaining dose 1 hour on the morning of your CT scan 2 tablet 0  . sildenafil (VIAGRA) 100 MG tablet Take 1 tablet (100 mg total) by mouth daily as needed for erectile dysfunction. 5 tablet 11   No current facility-administered medications on file prior to visit.     Past Medical History:  Diagnosis Date  . Adenomatous colon polyp 2006  X3 , all < 26mm  . Chest pain    ER visit 12/12 with coronary CT showing 50% soft stenosis in the RCA with otherwise patent vessels and calcium score of 0  . Coronary artery disease    soft plaque with 50% narrowing R mid coronary artery  . GERD (gastroesophageal reflux disease)   . Gilbert's syndrome   . H/O: rheumatic fever    noted after tonsillectomy  . Hematuria, microscopic   . Hyperlipidemia   . Hypertension   . IBS (irritable bowel syndrome)    Dr Carlean Purl  . Internal hemorrhoids   . Prostatitis   . Pulmonary nodule    4 mm RML nodule on CT scan in 12/12. Needs follow up in one year.     Past Surgical History:  Procedure Laterality Date  . COLONOSCOPY W/ POLYPECTOMY  2006    internal hemorrhoids 2011  . CYSTOSCOPY     X4 , Dr Terance Hart; last 2011  . TONSILLECTOMY AND ADENOIDECTOMY    . VASECTOMY      Social History   Socioeconomic History  . Marital status: Married    Spouse name: Not on file  . Number of children: 2  . Years of education: Not on file  . Highest education level: Not on file  Occupational History    Employer: [employer]  . Financial resource strain: Not on file  . Food insecurity:    Worry: Not on file    Inability: Not on file  . Transportation needs:    Medical: Not on file    Non-medical: Not on file  Tobacco Use  . Smoking status: Never Smoker  . Smokeless tobacco: Never Used  Substance and Sexual Activity  . Alcohol use: Yes    Alcohol/week: 8.0 standard drinks    Types: 3 Glasses of wine, 5 Cans of beer per week    Comment: social  . Drug use: No  . Sexual activity: Yes  Lifestyle  . Physical activity:    Days per week: Not on file    Minutes per session: Not on file  . Stress: Not on file  Relationships  . Social connections:    Talks on phone: Not on file    Gets together: Not on file    Attends religious service: Not on file    Active member of club or organization: Not on file    Attends meetings of clubs or organizations: Not on file    Relationship status: Not on file  Other Topics Concern  . Not on file  Social History Narrative  . Not on file    Family History  Problem Relation Age of Onset  . Cancer Father        bladder cancer  . Cancer Brother        bladder cancer  . Hypertension Brother   . Cancer Paternal Aunt        bladder cancer  . Hyperlipidemia Mother   . Hypertension Mother   . Stroke Neg Hx   . Diabetes Neg Hx   . Heart attack Neg Hx   . Colon cancer Neg Hx     Review of Systems  Constitutional: Negative for appetite change, chills and fever.  HENT: Positive for congestion (mild), ear pain and sinus pressure. Negative for postnasal drip and sore throat.    Respiratory: Negative for cough, shortness of breath and wheezing.   Neurological: Positive for headaches. Negative for dizziness and light-headedness.  Objective:   Vitals:   07/25/18 1016  BP: 136/78  Pulse: 83  Resp: 16  Temp: 98.8 F (37.1 C)  SpO2: 97%   Filed Weights   07/25/18 1016  Weight: 183 lb (83 kg)   Body mass index is 27.02 kg/m.  Wt Readings from Last 3 Encounters:  07/25/18 183 lb (83 kg)  12/24/17 180 lb 12.8 oz (82 kg)  04/19/17 180 lb (81.6 kg)     Physical Exam GENERAL APPEARANCE: Appears stated age, well appearing, NAD EYES: conjunctiva clear, no icterus HEENT: bilateral tympanic membranes and ear canals normal, oropharynx with no erythema, mild maxillary sinus tenderness bilaterally, no thyromegaly, trachea midline, no cervical or supraclavicular lymphadenopathy LUNGS: Clear to auscultation without wheeze or crackles, unlabored breathing, good air entry bilaterally CARDIOVASCULAR: Normal S1,S2 without murmurs, no edema SKIN: warm, dry        Assessment & Plan:   See Problem List for Assessment and Plan of chronic medical problems.

## 2018-07-25 ENCOUNTER — Other Ambulatory Visit: Payer: Self-pay

## 2018-07-25 ENCOUNTER — Ambulatory Visit (INDEPENDENT_AMBULATORY_CARE_PROVIDER_SITE_OTHER): Payer: Medicare Other | Admitting: Internal Medicine

## 2018-07-25 ENCOUNTER — Encounter: Payer: Self-pay | Admitting: Internal Medicine

## 2018-07-25 VITALS — BP 136/78 | HR 83 | Temp 98.8°F | Resp 16 | Ht 69.0 in | Wt 183.0 lb

## 2018-07-25 DIAGNOSIS — J0100 Acute maxillary sinusitis, unspecified: Secondary | ICD-10-CM | POA: Insufficient documentation

## 2018-07-25 MED ORDER — AMOXICILLIN 500 MG PO CAPS: 500.0000 mg | ORAL_CAPSULE | Freq: Three times a day (TID) | ORAL | 0 refills | Status: DC

## 2018-07-25 MED ORDER — LORATADINE 10 MG PO TABS: 10.0000 mg | ORAL_TABLET | Freq: Every day | ORAL | 11 refills | Status: DC

## 2018-07-25 NOTE — Patient Instructions (Addendum)
Take the amoxicillin as prescribed.  Complete the entire course.    Take loratadine for allergies.     Call if no improvement       Sinusitis, Adult Sinusitis is inflammation of your sinuses. Sinuses are hollow spaces in the bones around your face. Your sinuses are located:  Around your eyes.  In the middle of your forehead.  Behind your nose.  In your cheekbones. Mucus normally drains out of your sinuses. When your nasal tissues become inflamed or swollen, mucus can become trapped or blocked. This allows bacteria, viruses, and fungi to grow, which leads to infection. Most infections of the sinuses are caused by a virus. Sinusitis can develop quickly. It can last for up to 4 weeks (acute) or for more than 12 weeks (chronic). Sinusitis often develops after a cold. What are the causes? This condition is caused by anything that creates swelling in the sinuses or stops mucus from draining. This includes:  Allergies.  Asthma.  Infection from bacteria or viruses.  Deformities or blockages in your nose or sinuses.  Abnormal growths in the nose (nasal polyps).  Pollutants, such as chemicals or irritants in the air.  Infection from fungi (rare). What increases the risk? You are more likely to develop this condition if you:  Have a weak body defense system (immune system).  Do a lot of swimming or diving.  Overuse nasal sprays.  Smoke. What are the signs or symptoms? The main symptoms of this condition are pain and a feeling of pressure around the affected sinuses. Other symptoms include:  Stuffy nose or congestion.  Thick drainage from your nose.  Swelling and warmth over the affected sinuses.  Headache.  Upper toothache.  A cough that may get worse at night.  Extra mucus that collects in the throat or the back of the nose (postnasal drip).  Decreased sense of smell and taste.  Fatigue.  A fever.  Sore throat.  Bad breath. How is this  diagnosed? This condition is diagnosed based on:  Your symptoms.  Your medical history.  A physical exam.  Tests to find out if your condition is acute or chronic. This may include: ? Checking your nose for nasal polyps. ? Viewing your sinuses using a device that has a light (endoscope). ? Testing for allergies or bacteria. ? Imaging tests, such as an MRI or CT scan. In rare cases, a bone biopsy may be done to rule out more serious types of fungal sinus disease. How is this treated? Treatment for sinusitis depends on the cause and whether your condition is chronic or acute.  If caused by a virus, your symptoms should go away on their own within 10 days. You may be given medicines to relieve symptoms. They include: ? Medicines that shrink swollen nasal passages (topical intranasal decongestants). ? Medicines that treat allergies (antihistamines). ? A spray that eases inflammation of the nostrils (topical intranasal corticosteroids). ? Rinses that help get rid of thick mucus in your nose (nasal saline washes).  If caused by bacteria, your health care provider may recommend waiting to see if your symptoms improve. Most bacterial infections will get better without antibiotic medicine. You may be given antibiotics if you have: ? A severe infection. ? A weak immune system.  If caused by narrow nasal passages or nasal polyps, you may need to have surgery. Follow these instructions at home: Medicines  Take, use, or apply over-the-counter and prescription medicines only as told by your health care provider. These  may include nasal sprays.  If you were prescribed an antibiotic medicine, take it as told by your health care provider. Do not stop taking the antibiotic even if you start to feel better. Hydrate and humidify   Drink enough fluid to keep your urine pale yellow. Staying hydrated will help to thin your mucus.  Use a cool mist humidifier to keep the humidity level in your home  above 50%.  Inhale steam for 10-15 minutes, 3-4 times a day, or as told by your health care provider. You can do this in the bathroom while a hot shower is running.  Limit your exposure to cool or dry air. Rest  Rest as much as possible.  Sleep with your head raised (elevated).  Make sure you get enough sleep each night. General instructions   Apply a warm, moist washcloth to your face 3-4 times a day or as told by your health care provider. This will help with discomfort.  Wash your hands often with soap and water to reduce your exposure to germs. If soap and water are not available, use hand sanitizer.  Do not smoke. Avoid being around people who are smoking (secondhand smoke).  Keep all follow-up visits as told by your health care provider. This is important. Contact a health care provider if:  You have a fever.  Your symptoms get worse.  Your symptoms do not improve within 10 days. Get help right away if:  You have a severe headache.  You have persistent vomiting.  You have severe pain or swelling around your face or eyes.  You have vision problems.  You develop confusion.  Your neck is stiff.  You have trouble breathing. Summary  Sinusitis is soreness and inflammation of your sinuses. Sinuses are hollow spaces in the bones around your face.  This condition is caused by nasal tissues that become inflamed or swollen. The swelling traps or blocks the flow of mucus. This allows bacteria, viruses, and fungi to grow, which leads to infection.  If you were prescribed an antibiotic medicine, take it as told by your health care provider. Do not stop taking the antibiotic even if you start to feel better.  Keep all follow-up visits as told by your health care provider. This is important. This information is not intended to replace advice given to you by your health care provider. Make sure you discuss any questions you have with your health care provider. Document  Released: 04/30/2005 Document Revised: 09/30/2017 Document Reviewed: 09/30/2017 Elsevier Interactive Patient Education  2019 [employer].

## 2018-07-25 NOTE — Assessment & Plan Note (Signed)
Likely with partially treated sinus infection Will give additional antibiotics-amoxicillin 500 mg 3 times daily for an additional 5 days Loratadine as needed now and for seasonal allergies Symptomatic treatment Call if symptoms do not resolve completely

## 2018-08-25 ENCOUNTER — Ambulatory Visit: Payer: Medicare Other | Admitting: Internal Medicine

## 2018-08-25 ENCOUNTER — Encounter: Payer: Self-pay | Admitting: Internal Medicine

## 2018-08-25 ENCOUNTER — Ambulatory Visit (INDEPENDENT_AMBULATORY_CARE_PROVIDER_SITE_OTHER): Payer: Medicare Other | Admitting: Internal Medicine

## 2018-08-25 DIAGNOSIS — R0781 Pleurodynia: Secondary | ICD-10-CM | POA: Diagnosis not present

## 2018-08-25 NOTE — Assessment & Plan Note (Signed)
Pain anterior lower right rib near sternum for 4 weeks, over the past week pain in the posterior right rib No concerning symptoms Pain with palpation, deep breaths and twisting ?  Dislocated rib We will see sports medicine tomorrow Possible x-ray tomorrow

## 2018-08-25 NOTE — Progress Notes (Signed)
Virtual Visit via Video Note  I connected with Nicholas Paul on 08/25/18 at 11:00 AM EDT by a video enabled telemedicine application and verified that I am speaking with the correct person using two identifiers.   I discussed the limitations of evaluation and management by telemedicine and the availability of in person appointments. The patient expressed understanding and agreed to proceed.  The patient is currently at home and I am in the office.    No referring provider.    History of Present Illness: This visit is for an acute problem: Right rib pain.  He is having pain in his right anterior lower rib near the sternum.  It started 4 weeks ago.  He does play golf regularly and wondered if it was from golf when it first started.  He does not recall any obvious sudden onset of pain while playing golf or any other injuries.  He states it is sore to palpation, with deep breaths and with movement.  Twisting is what probably causes the most pain.  He rates the pain is a 5/10.  Over the past week he has noticed pain on the backside of the right rib.  He describes the pain as a dull pain.  He is still playing golf and able to walk a couple miles without difficulty, but has discomfort.  He denies any fevers, coughing, wheezing, shortness of breath or chest pain.  He was concerned because it is been 4 weeks and it is not getting better.     Social History   Socioeconomic History  . Marital status: Married    Spouse name: Not on file  . Number of children: 2  . Years of education: Not on file  . Highest education level: Not on file  Occupational History    Employer: [employer]  . Financial resource strain: Not on file  . Food insecurity:    Worry: Not on file    Inability: Not on file  . Transportation needs:    Medical: Not on file    Non-medical: Not on file  Tobacco Use  . Smoking status: Never Smoker  . Smokeless tobacco: Never Used  Substance and Sexual  Activity  . Alcohol use: Yes    Alcohol/week: 8.0 standard drinks    Types: 3 Glasses of wine, 5 Cans of beer per week    Comment: social  . Drug use: No  . Sexual activity: Yes  Lifestyle  . Physical activity:    Days per week: Not on file    Minutes per session: Not on file  . Stress: Not on file  Relationships  . Social connections:    Talks on phone: Not on file    Gets together: Not on file    Attends religious service: Not on file    Active member of club or organization: Not on file    Attends meetings of clubs or organizations: Not on file    Relationship status: Not on file  Other Topics Concern  . Not on file  Social History Narrative  . Not on file     Observations/Objective: Appears well in NAD Temperature this morning 97.5 Blood pressure today 124/78 Heart rate 80  Assessment and Plan:  See Problem List for Assessment and Plan of chronic medical problems.   Follow Up Instructions:    I discussed the assessment and treatment plan with the patient. The patient was provided an opportunity to ask questions and all were  answered. The patient agreed with the plan and demonstrated an understanding of the instructions.   The patient was advised to call back or seek an in-person evaluation if the symptoms worsen or if the condition fails to improve as anticipated.    Binnie Rail, MD

## 2018-08-26 ENCOUNTER — Ambulatory Visit (INDEPENDENT_AMBULATORY_CARE_PROVIDER_SITE_OTHER): Payer: Medicare Other | Admitting: Family Medicine

## 2018-08-26 ENCOUNTER — Ambulatory Visit (INDEPENDENT_AMBULATORY_CARE_PROVIDER_SITE_OTHER)
Admission: RE | Admit: 2018-08-26 | Discharge: 2018-08-26 | Disposition: A | Discharge status: Home / Self Care | Payer: Medicare Other | Source: Ambulatory Visit | Attending: Family Medicine | Admitting: Family Medicine

## 2018-08-26 ENCOUNTER — Other Ambulatory Visit: Payer: Self-pay

## 2018-08-26 ENCOUNTER — Encounter: Payer: Self-pay | Admitting: Family Medicine

## 2018-08-26 ENCOUNTER — Other Ambulatory Visit (INDEPENDENT_AMBULATORY_CARE_PROVIDER_SITE_OTHER): Payer: Medicare Other

## 2018-08-26 ENCOUNTER — Encounter: Payer: Self-pay | Admitting: Internal Medicine

## 2018-08-26 VITALS — BP 130/82 | HR 93 | Ht 69.0 in | Wt 182.0 lb

## 2018-08-26 DIAGNOSIS — E559 Vitamin D deficiency, unspecified: Secondary | ICD-10-CM

## 2018-08-26 DIAGNOSIS — M2550 Pain in unspecified joint: Secondary | ICD-10-CM

## 2018-08-26 DIAGNOSIS — R0782 Intercostal pain: Secondary | ICD-10-CM | POA: Diagnosis not present

## 2018-08-26 DIAGNOSIS — R0781 Pleurodynia: Secondary | ICD-10-CM

## 2018-08-26 LAB — COMPREHENSIVE METABOLIC PANEL
ALT: 22 U/L (ref 0–53)
AST: 18 U/L (ref 0–37)
Albumin: 4.4 g/dL (ref 3.5–5.2)
Alkaline Phosphatase: 67 U/L (ref 39–117)
BUN: 14 mg/dL (ref 6–23)
CO2: 31 mEq/L (ref 19–32)
Calcium: 9.5 mg/dL (ref 8.4–10.5)
Chloride: 99 mEq/L (ref 96–112)
Creatinine, Ser: 1.16 mg/dL (ref 0.40–1.50)
GFR: 62.46 mL/min (ref >60.00)
Glucose, Bld: 144 mg/dL — ABNORMAL HIGH (ref 70–99)
Potassium: 3.7 mEq/L (ref 3.5–5.1)
Sodium: 138 mEq/L (ref 135–145)
Total Bilirubin: 1.2 mg/dL (ref 0.2–1.2)
Total Protein: 7.4 g/dL (ref 6.0–8.3)

## 2018-08-26 LAB — URIC ACID: Uric Acid, Serum: 5.3 mg/dL (ref 4.0–7.8)

## 2018-08-26 LAB — SEDIMENTATION RATE: Sed Rate: 10 mm/hr (ref 0–20)

## 2018-08-26 LAB — C-REACTIVE PROTEIN: CRP: <1 mg/dL (ref 0.5–20.0)

## 2018-08-26 LAB — VITAMIN D 25 HYDROXY (VIT D DEFICIENCY, FRACTURES): VITD: 24.54 ng/mL — ABNORMAL LOW (ref 30.00–100.00)

## 2018-08-26 MED ORDER — MELOXICAM 7.5 MG PO TABS: 7.5000 mg | ORAL_TABLET | Freq: Every day | ORAL | 0 refills | Status: DC

## 2018-08-26 NOTE — Progress Notes (Signed)
[name] [address] [place], [place] [address] Phone: [phone] Subjective:    I'm seeing this patient by the request  of:  Binnie Rail, MD   CC: Right rib pain  Nicholas Paul  Nicholas Paul is a 68 y.o. male coming in with complaint of rib pain. Right side posterior pain. TTP. Plays golf regularly.  Onset- 1-4 weeks  Location- right posterior and anterior pain  Character- dull  Aggravating factors- twisting, deep breathe, laying down Reliving factors-  Therapies tried- topical, Aleve Severity-7 out of 10 Patient has had this pain and has noticed it more with golf at this time.  Any type of rotation seems to make it worse.  Rates the severity of pain 9 out of 10 seems to be more of a sharp pain.    Past Medical History:  Diagnosis Date   Adenomatous colon polyp 2006                                                                                                     X3 , all < 34mm   Chest pain    ER visit 12/12 with coronary CT showing 50% soft stenosis in the RCA with otherwise patent vessels and calcium score of 0   Coronary artery disease    soft plaque with 50% narrowing R mid coronary artery   GERD (gastroesophageal reflux disease)    Gilbert's syndrome    H/O: rheumatic fever    noted after tonsillectomy   Hematuria, microscopic    Hyperlipidemia    Hypertension    IBS (irritable bowel syndrome)    Dr Carlean Purl   Internal hemorrhoids    Prostatitis    Pulmonary nodule    4 mm RML nodule on CT scan in 12/12. Needs follow up in one year.    Past Surgical History:  Procedure Laterality Date   COLONOSCOPY W/ POLYPECTOMY  2006   internal hemorrhoids 2011   CYSTOSCOPY     X4 , Dr Terance Hart; last 2011   [place] History   Socioeconomic History   Marital status: Married    Spouse name: Not on file   Number of children: 2   Years of education: Not on  file   Highest education level: Not on file  Occupational History    Employer: [employer]   Financial resource strain: Not on file   Food insecurity:    Worry: Not on file    Inability: Not on file   Transportation needs:    Medical: Not on file    Non-medical: Not on file  Tobacco Use   Smoking status: Never Smoker   Smokeless tobacco: Never Used  Substance and Sexual Activity   Alcohol use: Yes    Alcohol/week: 8.0 standard drinks    Types: 3 Glasses of wine, 5 Cans of beer per week    Comment: social   Drug use: No   Sexual activity: Yes  Lifestyle   Physical  activity:    Days per week: Not on file    Minutes per session: Not on file   Stress: Not on file  Relationships   Social connections:    Talks on phone: Not on file    Gets together: Not on file    Attends religious service: Not on file    Active member of club or organization: Not on file    Attends meetings of clubs or organizations: Not on file    Relationship status: Not on file  Other Topics Concern   Not on file  Social History Narrative   Not on file   Allergies  Allergen Reactions   Doxazosin Mesylate     REACTION: edema on the generic.  Swelling around eyes per pt. maw   Crestor [Rosuvastatin]     Crestor 5 mg qd caused leg myalgias ; no issue with Lipitor 10 mg M, W, & F   Family History  Problem Relation Age of Onset   Cancer Father        bladder cancer   Cancer Brother        bladder cancer   Hypertension Brother    Cancer Paternal Aunt        bladder cancer   Hyperlipidemia Mother    Hypertension Mother    Stroke Neg Hx    Diabetes Neg Hx    Heart attack Neg Hx    Colon cancer Neg Hx      Current Outpatient Medications (Cardiovascular):    amLODipine (NORVASC) 5 MG tablet, TAKE 1 TABLET BY MOUTH EVERY DAY   atorvastatin (LIPITOR) 10 MG tablet, TAKE 10 MG BY MOUTH ON MONDAYS, WEDNESDAYS, AND FRIDAYS ONLY.   hydrochlorothiazide  (MICROZIDE) 12.5 MG capsule, TAKE 1 CAPSULE BY MOUTH EVERY DAY   quinapril (ACCUPRIL) 40 MG tablet, TAKE 1 TABLET BY MOUTH EVERY DAY   metoprolol tartrate (LOPRESSOR) 50 MG tablet, Take 1 tablet (50 mg total) by mouth as directed. Take one dose at bedtime the night prior to CT scan. Take the remaining dose 1 hour on the morning of your CT scan   sildenafil (VIAGRA) 100 MG tablet, Take 1 tablet (100 mg total) by mouth daily as needed for erectile dysfunction.  Current Outpatient Medications (Respiratory):    fluticasone (FLONASE) 50 MCG/ACT nasal spray, Place 2 sprays into both nostrils daily.   loratadine (CLARITIN) 10 MG tablet, Take 1 tablet (10 mg total) by mouth daily.  Current Outpatient Medications (Analgesics):    meloxicam (MOBIC) 7.5 MG tablet, Take 1 tablet (7.5 mg total) by mouth daily.   Current Outpatient Medications (Other):    Coenzyme Q10 (CO Q 10 PO), Take 1 capsule by mouth daily.   famotidine (PEPCID AC) 10 MG chewable tablet, Chew 10 mg by mouth as needed. For heartburn.   Omega-3 Fatty Acids (OMEGA 3 PO), Take by mouth daily.   psyllium (METAMUCIL) 58.6 % packet, Take 2 packets by mouth 2 (two) times daily.    tamsulosin (FLOMAX) 0.4 MG CAPS capsule, TAKE 1 CAPSULE BY MOUTH EVERYDAY AT BEDTIME   valACYclovir (VALTREX) 1000 MG tablet, Take 1 tablet (1,000 mg total) by mouth 3 (three) times daily.    Past medical history, social, surgical and family history all reviewed in electronic medical record.  No pertanent information unless stated regarding to the chief complaint.   Review of Systems:  No headache, visual changes, nausea, vomiting, diarrhea, constipation, dizziness, abdominal pain, skin rash, fevers, chills, night sweats, weight loss, swollen lymph  nodes, body aches, joint swelling, chest pain, shortness of breath, mood changes.  Positive muscle aches  Objective  Blood pressure 130/82, pulse 93, height 5\' 9"  (1.753 m), weight 182 lb (82.6 kg), SpO2  97 %.    General: No apparent distress alert and oriented x3 mood and affect normal, dressed appropriately.  HEENT: Pupils equal, extraocular movements intact  Respiratory: Patient's speak in full sentences and does not appear short of breath  Cardiovascular: No lower extremity edema, non tender, no erythema  Skin: Warm dry intact with no signs of infection or rash on extremities or on axial skeleton.  Abdomen: Soft nontender  Neuro: Cranial nerves II through XII are intact, neurovascularly intact in all extremities with 2+ DTRs and 2+ pulses.  Lymph: No lymphadenopathy of posterior or anterior cervical chain or axillae bilaterally.  Gait normal with good balance and coordination.  MSK:  Non tender with full range of motion and good stability and symmetric strength and tone of shoulders, elbows, wrist, hip, knee and ankles bilaterally.  Rib exam right side pain is more over the fifth and sixth ribs on the right side near the sternal margin.  Patient does have what appears to be some calcific changes in the area.  No significant palpable defect though noted.  No significant pain in the back but some mild scapular dyskinesis noted  The musculoskeletal ultrasound was performed and interpreted by Lyndal Pulley  Limited ultrasound of patient's soft tissue between the fifth and sixth ribs does have some calcific changes.  Seems to be more in the soft tissue.  No true cortical defect noted.    Impression and Recommendations:     This case required medical decision making of moderate complexity. The above documentation has been reviewed and is accurate and complete Lyndal Pulley, DO       Note: This dictation was prepared with Dragon dictation along with smaller phrase technology. Any transcriptional errors that result from this process are unintentional.

## 2018-08-26 NOTE — Assessment & Plan Note (Signed)
Right-sided rib pain seems to be more of a chondrocalcinosis.  Seems to be over the bone as well as of the soft tissues.  Somewhat concerning for more of a systemic illness.  We will get laboratory work-up otherwise.  Could be something as pseudogout, vitamin D deficiency, parathyroid pathology.  Patient is on hydrochlorothiazide and may need to consider decreasing or discontinuing altogether.  Meloxicam given for pain relief, discussed icing regimen, follow-up again in 3 weeks.

## 2018-08-26 NOTE — Patient Instructions (Addendum)
Good to see you  Ice 20 minutes 2 times daily. Usually after activity and before bed. Meloxicam daily for 10 days then as needed .Exercises 3 times a week.  No larger then a 5 wood when you play and get 2 extra strokes ;) We will get labs downstairs and see how they are and if any reason for the calcium deposits  I will call you if radiology is concern  Lets do a virtual visit in 2-3 weeks if you need

## 2018-08-27 ENCOUNTER — Other Ambulatory Visit (INDEPENDENT_AMBULATORY_CARE_PROVIDER_SITE_OTHER): Payer: Medicare Other

## 2018-08-27 ENCOUNTER — Encounter: Payer: Self-pay | Admitting: Family Medicine

## 2018-08-27 DIAGNOSIS — R739 Hyperglycemia, unspecified: Secondary | ICD-10-CM | POA: Diagnosis not present

## 2018-08-27 LAB — RHEUMATOID FACTOR: Rhuematoid fact SerPl-aCnc: <14 IU/mL (ref <14)

## 2018-08-27 LAB — PTH, INTACT AND CALCIUM
Calcium: 9.8 mg/dL (ref 8.6–10.3)
PTH: 8 pg/mL — ABNORMAL LOW (ref 14–64)

## 2018-08-27 LAB — CALCIUM, IONIZED: Calcium, Ion: 4.98 mg/dL (ref 4.8–5.6)

## 2018-08-27 LAB — HEMOGLOBIN A1C: Hgb A1c MFr Bld: 5.5 % (ref 4.6–6.5)

## 2018-09-14 NOTE — Progress Notes (Deleted)
[facility] [address] [place], [place] [address] Phone: [phone] Subjective:    I'm seeing this patient by the request  of:    CC: Rib pain follow-up  IHK:[id number]  Nicholas Paul is a 68 y.o. male coming in with complaint of rib pain.  Found to have calcinosis of the ribs.  Found to have vitamin D deficiency patient was to start vitamin D as well as tart cherry extract.    Past Medical History:  Diagnosis Date  . Adenomatous colon polyp 2006                                                                                                     X3 , all < 34mm  . Chest pain    ER visit 12/12 with coronary CT showing 50% soft stenosis in the RCA with otherwise patent vessels and calcium score of 0  . Coronary artery disease    soft plaque with 50% narrowing R mid coronary artery  . GERD (gastroesophageal reflux disease)   . Gilbert's syndrome   . H/O: rheumatic fever    noted after tonsillectomy  . Hematuria, microscopic   . Hyperlipidemia   . Hypertension   . IBS (irritable bowel syndrome)    Dr Carlean Purl  . Internal hemorrhoids   . Prostatitis   . Pulmonary nodule    4 mm RML nodule on CT scan in 12/12. Needs follow up in one year.    Past Surgical History:  Procedure Laterality Date  . COLONOSCOPY W/ POLYPECTOMY  2006   internal hemorrhoids 2011  . CYSTOSCOPY     X4 , Dr Terance Hart; last 2011  . TONSILLECTOMY AND ADENOIDECTOMY    . VASECTOMY     Social History   Socioeconomic History  . Marital status: Married    Spouse name: Not on file  . Number of children: 2  . Years of education: Not on file  . Highest education level: Not on file  Occupational History    Employer: [employer]  . Financial resource strain: Not on file  . Food insecurity:    Worry: Not on file    Inability: Not on file  . Transportation needs:    Medical: Not on file    Non-medical: Not on file  Tobacco Use  . Smoking status:  Never Smoker  . Smokeless tobacco: Never Used  Substance and Sexual Activity  . Alcohol use: Yes    Alcohol/week: 8.0 standard drinks    Types: 3 Glasses of wine, 5 Cans of beer per week    Comment: social  . Drug use: No  . Sexual activity: Yes  Lifestyle  . Physical activity:    Days per week: Not on file    Minutes per session: Not on file  . Stress: Not on file  Relationships  . Social connections:    Talks on phone: Not on file    Gets together: Not on file    Attends religious service: Not on file    Active  member of club or organization: Not on file    Attends meetings of clubs or organizations: Not on file    Relationship status: Not on file  Other Topics Concern  . Not on file  Social History Narrative  . Not on file   Allergies  Allergen Reactions  . Doxazosin Mesylate     REACTION: edema on the generic.  Swelling around eyes per pt. maw  . Crestor [Rosuvastatin]     Crestor 5 mg qd caused leg myalgias ; no issue with Lipitor 10 mg M, W, & F   Family History  Problem Relation Age of Onset  . Cancer Father        bladder cancer  . Cancer Brother        bladder cancer  . Hypertension Brother   . Cancer Paternal Aunt        bladder cancer  . Hyperlipidemia Mother   . Hypertension Mother   . Stroke Neg Hx   . Diabetes Neg Hx   . Heart attack Neg Hx   . Colon cancer Neg Hx      Current Outpatient Medications (Cardiovascular):  .  amLODipine (NORVASC) 5 MG tablet, TAKE 1 TABLET BY MOUTH EVERY DAY .  atorvastatin (LIPITOR) 10 MG tablet, TAKE 10 MG BY MOUTH ON MONDAYS, WEDNESDAYS, AND FRIDAYS ONLY. .  hydrochlorothiazide (MICROZIDE) 12.5 MG capsule, TAKE 1 CAPSULE BY MOUTH EVERY DAY .  metoprolol tartrate (LOPRESSOR) 50 MG tablet, Take 1 tablet (50 mg total) by mouth as directed. Take one dose at bedtime the night prior to CT scan. Take the remaining dose 1 hour on the morning of your CT scan .  quinapril (ACCUPRIL) 40 MG tablet, TAKE 1 TABLET BY MOUTH EVERY  DAY .  sildenafil (VIAGRA) 100 MG tablet, Take 1 tablet (100 mg total) by mouth daily as needed for erectile dysfunction.  Current Outpatient Medications (Respiratory):  .  fluticasone (FLONASE) 50 MCG/ACT nasal spray, Place 2 sprays into both nostrils daily. Marland Kitchen  loratadine (CLARITIN) 10 MG tablet, Take 1 tablet (10 mg total) by mouth daily.  Current Outpatient Medications (Analgesics):  .  meloxicam (MOBIC) 7.5 MG tablet, Take 1 tablet (7.5 mg total) by mouth daily.   Current Outpatient Medications (Other):  Marland Kitchen  Coenzyme Q10 (CO Q 10 PO), Take 1 capsule by mouth daily. .  famotidine (PEPCID AC) 10 MG chewable tablet, Chew 10 mg by mouth as needed. For heartburn. .  Omega-3 Fatty Acids (OMEGA 3 PO), Take by mouth daily. .  psyllium (METAMUCIL) 58.6 % packet, Take 2 packets by mouth 2 (two) times daily.  .  tamsulosin (FLOMAX) 0.4 MG CAPS capsule, TAKE 1 CAPSULE BY MOUTH EVERYDAY AT BEDTIME .  valACYclovir (VALTREX) 1000 MG tablet, Take 1 tablet (1,000 mg total) by mouth 3 (three) times daily.    Past medical history, social, surgical and family history all reviewed in electronic medical record.  No pertanent information unless stated regarding to the chief complaint.   Review of Systems:  No headache, visual changes, nausea, vomiting, diarrhea, constipation, dizziness, abdominal pain, skin rash, fevers, chills, night sweats, weight loss, swollen lymph nodes, body aches, joint swelling, muscle aches, chest pain, shortness of breath, mood changes.   Objective  There were no vitals taken for this visit.    General: No apparent distress alert and oriented x3 mood and affect normal, dressed appropriately.  HEENT: Pupils equal, extraocular movements intact  Respiratory: Patient's speak in full sentences and does not appear  short of breath  Cardiovascular: No lower extremity edema, non tender, no erythema  Skin: Warm dry intact with no signs of infection or rash on extremities or on axial  skeleton.  Abdomen: Soft nontender  Neuro: Cranial nerves II through XII are intact, neurovascularly intact in all extremities with 2+ DTRs and 2+ pulses.  Lymph: No lymphadenopathy of posterior or anterior cervical chain or axillae bilaterally.  Gait normal with good balance and coordination.  MSK:  Non tender with full range of motion and good stability and symmetric strength and tone of shoulders, elbows, wrist, hip, knee and ankles bilaterally.     Impression and Recommendations:     This case required medical decision making of moderate complexity. The above documentation has been reviewed and is accurate and complete Lyndal Pulley, DO       Note: This dictation was prepared with Dragon dictation along with smaller phrase technology. Any transcriptional errors that result from this process are unintentional.

## 2018-09-15 ENCOUNTER — Encounter: Payer: Self-pay | Admitting: Family Medicine

## 2018-09-15 ENCOUNTER — Telehealth: Payer: Medicare Other

## 2018-09-15 ENCOUNTER — Ambulatory Visit: Payer: Medicare Other | Admitting: Family Medicine

## 2018-09-17 DIAGNOSIS — L570 Actinic keratosis: Secondary | ICD-10-CM | POA: Diagnosis not present

## 2018-09-17 DIAGNOSIS — L819 Disorder of pigmentation, unspecified: Secondary | ICD-10-CM | POA: Diagnosis not present

## 2018-09-17 DIAGNOSIS — Z85828 Personal history of other malignant neoplasm of skin: Secondary | ICD-10-CM | POA: Diagnosis not present

## 2018-09-17 DIAGNOSIS — L821 Other seborrheic keratosis: Secondary | ICD-10-CM | POA: Diagnosis not present

## 2018-09-17 DIAGNOSIS — L814 Other melanin hyperpigmentation: Secondary | ICD-10-CM | POA: Diagnosis not present

## 2018-09-17 DIAGNOSIS — D1801 Hemangioma of skin and subcutaneous tissue: Secondary | ICD-10-CM | POA: Diagnosis not present

## 2018-09-17 DIAGNOSIS — D229 Melanocytic nevi, unspecified: Secondary | ICD-10-CM | POA: Diagnosis not present

## 2018-09-18 ENCOUNTER — Other Ambulatory Visit: Payer: Self-pay | Admitting: Family Medicine

## 2018-10-13 ENCOUNTER — Other Ambulatory Visit: Payer: Self-pay | Admitting: Family Medicine

## 2018-10-30 ENCOUNTER — Ambulatory Visit: Payer: Medicare Other | Admitting: Internal Medicine

## 2018-11-05 NOTE — Progress Notes (Signed)
Subjective:    Patient ID: Nicholas Paul, male    DOB: 05-Sep-1950, 68 y.o.   MRN: [id number]  HPI The patient is here for follow up.  He is exercising regularly - golf and is walking once a week.     Hypertension: He is taking his medication daily. He is compliant with a low sodium diet.  He denies chest pain, palpitations, edema, shortness of breath and regular headaches.      Hyperlipidemia: He is taking his medication daily. He is compliant with a low fat/cholesterol diet. He has myalgias/cramping intermittently with the statin  Hyperglycemia:  He is compliant with a low sugar/carbohydrate diet.  He is exercising regularly.   Medications and allergies reviewed with patient and updated if appropriate.  Patient Active Problem List   Diagnosis Date Noted  . Rib pain on right side 08/25/2018  . Herpes zoster without complication 88/82/8003  . Urinary frequency 12/14/2016  . Hyperglycemia 09/03/2015  . Gilbert syndrome 07/30/2011  . Pulmonary nodule 05/21/2011  . ARTHRALGIA 07/14/2010  . LOW BACK PAIN SYNDROME 07/14/2010  . Irritable bowel syndrome 04/04/2010  . Actinic keratosis 10/20/2009  . History of colonic polyps 10/20/2009  . HEMATURIA, MICROSCOPIC, HX OF 10/20/2009  . Hyperlipidemia 07/31/2007  . Essential hypertension 07/31/2007    Current Outpatient Medications on File Prior to Visit  Medication Sig Dispense Refill  . amLODipine (NORVASC) 5 MG tablet TAKE 1 TABLET BY MOUTH EVERY DAY 90 tablet 3  . atorvastatin (LIPITOR) 10 MG tablet TAKE 10 MG BY MOUTH ON MONDAYS, WEDNESDAYS, AND FRIDAYS ONLY. 45 tablet 3  . Coenzyme Q10 (CO Q 10 PO) Take 1 capsule by mouth daily.    . famotidine (PEPCID AC) 10 MG chewable tablet Chew 10 mg by mouth as needed. For heartburn.    . fluticasone (FLONASE) 50 MCG/ACT nasal spray Place 2 sprays into both nostrils daily. 16 g 6  . hydrochlorothiazide (MICROZIDE) 12.5 MG capsule TAKE 1 CAPSULE BY MOUTH EVERY DAY 90 capsule 3  .  Omega-3 Fatty Acids (OMEGA 3 PO) Take by mouth daily.    . psyllium (METAMUCIL) 58.6 % packet Take 2 packets by mouth 2 (two) times daily.     . quinapril (ACCUPRIL) 40 MG tablet TAKE 1 TABLET BY MOUTH EVERY DAY 90 tablet 3  . tamsulosin (FLOMAX) 0.4 MG CAPS capsule TAKE 1 CAPSULE BY MOUTH EVERYDAY AT BEDTIME  11  . valACYclovir (VALTREX) 1000 MG tablet Take 1 tablet (1,000 mg total) by mouth 3 (three) times daily. 21 tablet 0  . sildenafil (VIAGRA) 100 MG tablet Take 1 tablet (100 mg total) by mouth daily as needed for erectile dysfunction. 5 tablet 11   No current facility-administered medications on file prior to visit.     Past Medical History:  Diagnosis Date  . Adenomatous colon polyp 2006                                                                                                     X3 , all < 59mm  . Chest pain  ER visit 12/12 with coronary CT showing 50% soft stenosis in the RCA with otherwise patent vessels and calcium score of 0  . Coronary artery disease    soft plaque with 50% narrowing R mid coronary artery  . GERD (gastroesophageal reflux disease)   . Gilbert's syndrome   . H/O: rheumatic fever    noted after tonsillectomy  . Hematuria, microscopic   . Hyperlipidemia   . Hypertension   . IBS (irritable bowel syndrome)    Dr Carlean Purl  . Internal hemorrhoids   . Prostatitis   . Pulmonary nodule    4 mm RML nodule on CT scan in 12/12. Needs follow up in one year.     Past Surgical History:  Procedure Laterality Date  . COLONOSCOPY W/ POLYPECTOMY  2006   internal hemorrhoids 2011  . CYSTOSCOPY     X4 , Dr Terance Hart; last 2011  . TONSILLECTOMY AND ADENOIDECTOMY    . VASECTOMY      Social History   Socioeconomic History  . Marital status: Married    Spouse name: Not on file  . Number of children: 2  . Years of education: Not on file  . Highest education level: Not on file  Occupational History    Employer: [employer]  . Financial  resource strain: Not on file  . Food insecurity    Worry: Not on file    Inability: Not on file  . Transportation needs    Medical: Not on file    Non-medical: Not on file  Tobacco Use  . Smoking status: Never Smoker  . Smokeless tobacco: Never Used  Substance and Sexual Activity  . Alcohol use: Yes    Alcohol/week: 8.0 standard drinks    Types: 3 Glasses of wine, 5 Cans of beer per week    Comment: social  . Drug use: No  . Sexual activity: Yes  Lifestyle  . Physical activity    Days per week: Not on file    Minutes per session: Not on file  . Stress: Not on file  Relationships  . Social Herbalist on phone: Not on file    Gets together: Not on file    Attends religious service: Not on file    Active member of club or organization: Not on file    Attends meetings of clubs or organizations: Not on file    Relationship status: Not on file  Other Topics Concern  . Not on file  Social History Narrative  . Not on file    Family History  Problem Relation Age of Onset  . Cancer Father        bladder cancer  . Cancer Brother        bladder cancer  . Hypertension Brother   . Cancer Paternal Aunt        bladder cancer  . Hyperlipidemia Mother   . Hypertension Mother   . Stroke Neg Hx   . Diabetes Neg Hx   . Heart attack Neg Hx   . Colon cancer Neg Hx     Review of Systems  Constitutional: Negative for chills and fever.  Respiratory: Negative for cough, shortness of breath and wheezing.   Cardiovascular: Negative for chest pain, palpitations and leg swelling.  Gastrointestinal: Negative for abdominal pain, constipation, diarrhea and nausea.  Musculoskeletal:       Left lateral ankle tendon tenderness and swelling in foot after twisting ankle  Neurological: Negative for  light-headedness and headaches.       Objective:   Vitals:   11/06/18 1120  BP: 132/80  Pulse: 71  Resp: 16  Temp: 98.3 F (36.8 C)  SpO2: 97%   BP Readings from Last 3  Encounters:  11/06/18 132/80  08/26/18 130/82  07/25/18 136/78   Wt Readings from Last 3 Encounters:  11/06/18 176 lb 12.8 oz (80.2 kg)  08/26/18 182 lb (82.6 kg)  07/25/18 183 lb (83 kg)   Body mass index is 26.11 kg/m.   Physical Exam    Constitutional: Appears well-developed and well-nourished. No distress.  HENT:  Head: Normocephalic and atraumatic.  Neck: Neck supple. No tracheal deviation present. No thyromegaly present.  No cervical lymphadenopathy Cardiovascular: Normal rate, regular rhythm and normal heart sounds.   No murmur heard. No carotid bruit .  No edema Pulmonary/Chest: Effort normal and breath sounds normal. No respiratory distress. No has no wheezes. No rales.  Abdomen; soft, NT, ND Skin: Skin is warm and dry. Not diaphoretic.  Psychiatric: Normal mood and affect. Behavior is normal.      Assessment & Plan:    Will check psa for prostate cancer screening  See Problem List for Assessment and Plan of chronic medical problems.

## 2018-11-06 ENCOUNTER — Other Ambulatory Visit: Payer: Self-pay

## 2018-11-06 ENCOUNTER — Encounter: Payer: Self-pay | Admitting: Internal Medicine

## 2018-11-06 ENCOUNTER — Other Ambulatory Visit (INDEPENDENT_AMBULATORY_CARE_PROVIDER_SITE_OTHER): Payer: Medicare Other

## 2018-11-06 ENCOUNTER — Ambulatory Visit (INDEPENDENT_AMBULATORY_CARE_PROVIDER_SITE_OTHER): Payer: Medicare Other | Admitting: Internal Medicine

## 2018-11-06 VITALS — BP 132/80 | HR 71 | Temp 98.3°F | Resp 16 | Ht 69.0 in | Wt 176.8 lb

## 2018-11-06 DIAGNOSIS — R739 Hyperglycemia, unspecified: Secondary | ICD-10-CM

## 2018-11-06 DIAGNOSIS — Z23 Encounter for immunization: Secondary | ICD-10-CM

## 2018-11-06 DIAGNOSIS — I10 Essential (primary) hypertension: Secondary | ICD-10-CM | POA: Diagnosis not present

## 2018-11-06 DIAGNOSIS — Z125 Encounter for screening for malignant neoplasm of prostate: Secondary | ICD-10-CM | POA: Diagnosis not present

## 2018-11-06 DIAGNOSIS — E7849 Other hyperlipidemia: Secondary | ICD-10-CM

## 2018-11-06 LAB — CBC WITH DIFFERENTIAL/PLATELET
Basophils Absolute: 0.1 10*3/uL (ref 0.0–0.1)
Basophils Relative: 1 % (ref 0.0–3.0)
Eosinophils Absolute: 0 10*3/uL (ref 0.0–0.7)
Eosinophils Relative: 0.6 % (ref 0.0–5.0)
HCT: 43 % (ref 39.0–52.0)
Hemoglobin: 15.1 g/dL (ref 13.0–17.0)
Lymphocytes Relative: 27 % (ref 12.0–46.0)
Lymphs Abs: 1.7 10*3/uL (ref 0.7–4.0)
MCHC: 35.1 g/dL (ref 30.0–36.0)
MCV: 87.9 fl (ref 78.0–100.0)
Monocytes Absolute: 0.5 10*3/uL (ref 0.1–1.0)
Monocytes Relative: 7.5 % (ref 3.0–12.0)
Neutro Abs: 4 10*3/uL (ref 1.4–7.7)
Neutrophils Relative %: 63.9 % (ref 43.0–77.0)
Platelets: 201 10*3/uL (ref 150.0–400.0)
RBC: 4.9 Mil/uL (ref 4.22–5.81)
RDW: 12.7 % (ref 11.5–15.5)
WBC: 6.3 10*3/uL (ref 4.0–10.5)

## 2018-11-06 LAB — TSH: TSH: 0.69 u[IU]/mL (ref 0.35–4.50)

## 2018-11-06 LAB — COMPREHENSIVE METABOLIC PANEL
ALT: 16 U/L (ref 0–53)
AST: 13 U/L (ref 0–37)
Albumin: 4.4 g/dL (ref 3.5–5.2)
Alkaline Phosphatase: 60 U/L (ref 39–117)
BUN: 11 mg/dL (ref 6–23)
CO2: 30 mEq/L (ref 19–32)
Calcium: 9 mg/dL (ref 8.4–10.5)
Chloride: 103 mEq/L (ref 96–112)
Creatinine, Ser: 0.93 mg/dL (ref 0.40–1.50)
GFR: 80.56 mL/min (ref >60.00)
Glucose, Bld: 104 mg/dL — ABNORMAL HIGH (ref 70–99)
Potassium: 4 mEq/L (ref 3.5–5.1)
Sodium: 140 mEq/L (ref 135–145)
Total Bilirubin: 1.4 mg/dL — ABNORMAL HIGH (ref 0.2–1.2)
Total Protein: 7 g/dL (ref 6.0–8.3)

## 2018-11-06 LAB — LIPID PANEL
Cholesterol: 159 mg/dL (ref 0–200)
HDL: 56.4 mg/dL (ref >39.00)
LDL Cholesterol: 87 mg/dL (ref 0–99)
NonHDL: 102.24
Total CHOL/HDL Ratio: 3
Triglycerides: 78 mg/dL (ref 0.0–149.0)
VLDL: 15.6 mg/dL (ref 0.0–40.0)

## 2018-11-06 LAB — HEMOGLOBIN A1C: Hgb A1c MFr Bld: 5.6 % (ref 4.6–6.5)

## 2018-11-06 LAB — PSA, MEDICARE: PSA: 3.04 ng/ml (ref 0.10–4.00)

## 2018-11-06 NOTE — Assessment & Plan Note (Signed)
BP well controlled Current regimen effective and well tolerated Continue current medications at current doses cmp  

## 2018-11-06 NOTE — Addendum Note (Signed)
Addended by: Delice Bison E on: 11/06/2018 01:19 PM   Modules accepted: Orders

## 2018-11-06 NOTE — Patient Instructions (Addendum)
  Tests ordered today. Your results will be released to [facility] (or called to you) after review, usually within 72hours after test completion. If any changes need to be made, you will be notified at that same time.  All other Health Maintenance issues reviewed.   All recommended immunizations and age-appropriate screenings are up-to-date or discussed.  Pneumonia immunization administered today.   Medications reviewed and updated.  Changes include :   none   Please followup in one year

## 2018-11-06 NOTE — Assessment & Plan Note (Addendum)
Check lipid panel, tsh, cmp Continue statin three times a week Regular exercise and healthy diet encouraged

## 2018-11-06 NOTE — Assessment & Plan Note (Signed)
Check a1c Low sugar / carb diet Stressed regular exercise   

## 2018-11-07 DIAGNOSIS — N401 Enlarged prostate with lower urinary tract symptoms: Secondary | ICD-10-CM | POA: Diagnosis not present

## 2018-11-07 DIAGNOSIS — R3914 Feeling of incomplete bladder emptying: Secondary | ICD-10-CM | POA: Diagnosis not present

## 2018-11-07 DIAGNOSIS — R972 Elevated prostate specific antigen [PSA]: Secondary | ICD-10-CM | POA: Diagnosis not present

## 2018-11-09 ENCOUNTER — Encounter: Payer: Self-pay | Admitting: Internal Medicine

## 2018-12-16 ENCOUNTER — Other Ambulatory Visit: Payer: Self-pay | Admitting: Nurse Practitioner

## 2018-12-21 NOTE — Progress Notes (Signed)
CARDIOLOGY OFFICE NOTE  Date:  12/23/2018    Nicholas Paul Date of Birth: 1950/09/19 Medical Record #[id number]  PCP:  Binnie Rail, MD  Cardiologist:  Servando Snare     Chief Complaint  Patient presents with  . Coronary Artery Disease    Follow up visit    History of Present Illness: Nicholas Paul is a 68 y.o. male who presents today for a 6 month check. Former patient of Dr. Sherryl Barters. He is following with me.   He has had prior coronary CT showing soft plaque in the RCA in 2012 and a calcium score of 0. Has HTN and HLD.Strong FH for bladder cancer.Has had a past lung nodule noted(4 mm) as well by CT in 2012 - but with no risk factors, no further follow up felt to be needed per radiology and after discussion with Dr. Mare Ferrari.   Last seen in February of 2020 - was doing well but had gotten lax with exercise and had gained weight and using a little more alcohol. We got his coronary CT updated - non obstructive disease noted. To continue with medical management.   The patient does not have symptoms concerning for COVID-19 infection (fever, chills, cough, or new shortness of breath).    Comes in today. Here alone. Doing well. No chest pain. Breathing is good. Staying safe. Playing golf. Has had some right rib pain - found to have calcium build up - saw sports med and was placed on high dose vit D - this resolved. He is doing well.    Past Medical History:  Diagnosis Date  . Adenomatous colon polyp 2006                                                                                                     X3 , all < 62mm  . Chest pain    ER visit 12/12 with coronary CT showing 50% soft stenosis in the RCA with otherwise patent vessels and calcium score of 0  . Coronary artery disease    soft plaque with 50% narrowing R mid coronary artery  . GERD (gastroesophageal reflux disease)   . Gilbert's syndrome   . H/O: rheumatic fever    noted after tonsillectomy  .  Hematuria, microscopic   . Hyperlipidemia   . Hypertension   . IBS (irritable bowel syndrome)    Dr Carlean Purl  . Internal hemorrhoids   . Prostatitis   . Pulmonary nodule    4 mm RML nodule on CT scan in 12/12. Needs follow up in one year.     Past Surgical History:  Procedure Laterality Date  . COLONOSCOPY W/ POLYPECTOMY  2006   internal hemorrhoids 2011  . CYSTOSCOPY     X4 , Dr Terance Hart; last 2011  . TONSILLECTOMY AND ADENOIDECTOMY    . VASECTOMY       Medications: Current Meds  Medication Sig  . amLODipine (NORVASC) 5 MG tablet TAKE 1 TABLET BY MOUTH EVERY DAY  . atorvastatin (LIPITOR) 10 MG tablet TAKE 10 MG BY MOUTH ON MONDAYS,  WEDNESDAYS, AND FRIDAYS ONLY.  Marland Kitchen Cholecalciferol (VITAMIN D) 125 MCG (5000 UT) CAPS Take 125 mcg by mouth daily.   . Coenzyme Q10 (CO Q 10 PO) Take 1 capsule by mouth daily.  . famotidine (PEPCID AC) 10 MG chewable tablet Chew 10 mg by mouth as needed. For heartburn.  . fluticasone (FLONASE) 50 MCG/ACT nasal spray Place 2 sprays into both nostrils daily.  . hydrochlorothiazide (MICROZIDE) 12.5 MG capsule TAKE 1 CAPSULE BY MOUTH EVERY DAY  . Omega-3 Fatty Acids (OMEGA 3 PO) Take by mouth daily.  . psyllium (METAMUCIL) 58.6 % packet Take 2 packets by mouth 2 (two) times daily.   . quinapril (ACCUPRIL) 40 MG tablet TAKE 1 TABLET BY MOUTH EVERY DAY  . tamsulosin (FLOMAX) 0.4 MG CAPS capsule Take 0.4 mg by mouth 2 (two) times daily.  . valACYclovir (VALTREX) 1000 MG tablet Take 1 tablet (1,000 mg total) by mouth 3 (three) times daily.     Allergies: Allergies  Allergen Reactions  . Doxazosin Mesylate     REACTION: edema on the generic.  Swelling around eyes per pt. maw  . Crestor [Rosuvastatin]     Crestor 5 mg qd caused leg myalgias ; no issue with Lipitor 10 mg M, W, & F    Social History: The patient  reports that he has never smoked. He has never used smokeless tobacco. He reports current alcohol use of about 8.0 standard drinks of alcohol  per week. He reports that he does not use drugs.   Family History: The patient's family history includes Cancer in his brother, father, and paternal aunt; Hyperlipidemia in his mother; Hypertension in his brother and mother.   Review of Systems: Please see the history of present illness.   All other systems are reviewed and negative.   Physical Exam: VS:  BP 130/70 (BP Location: Left Arm, Patient Position: Sitting, Cuff Size: Normal)   Pulse 78   Ht 5' 9.5" (1.765 m)   Wt 179 lb 12.8 oz (81.6 kg)   BMI 26.17 kg/m  .  BMI Body mass index is 26.17 kg/m.  Wt Readings from Last 3 Encounters:  12/23/18 179 lb 12.8 oz (81.6 kg)  11/06/18 176 lb 12.8 oz (80.2 kg)  08/26/18 182 lb (82.6 kg)    General: Pleasant. Well developed, well nourished and in no acute distress.   HEENT: Normal.  Neck: Supple, no JVD, carotid bruits, or masses noted.  Cardiac: Regular rate and rhythm. No murmurs, rubs, or gallops. No edema.  Respiratory:  Lungs are clear to auscultation bilaterally with normal work of breathing.  GI: Soft and nontender.  MS: No deformity or atrophy. Gait and ROM intact.  Skin: Warm and dry. Color is normal.  Neuro:  Strength and sensation are intact and no gross focal deficits noted.  Psych: Alert, appropriate and with normal affect.   LABORATORY DATA:  EKG:  EKG is ordered today. This demonstrates NSR.  Lab Results  Component Value Date   WBC 6.3 11/06/2018   HGB 15.1 11/06/2018   HCT 43.0 11/06/2018   PLT 201.0 11/06/2018   GLUCOSE 104 (H) 11/06/2018   CHOL 159 11/06/2018   TRIG 78.0 11/06/2018   HDL 56.40 11/06/2018   LDLCALC 87 11/06/2018   ALT 16 11/06/2018   AST 13 11/06/2018   NA 140 11/06/2018   K 4.0 11/06/2018   CL 103 11/06/2018   CREATININE 0.93 11/06/2018   BUN 11 11/06/2018   CO2 30 11/06/2018   TSH 0.69  11/06/2018   PSA 3.04 11/06/2018   HGBA1C 5.6 11/06/2018     BNP (last 3 results) No results for input(s): BNP in the last 8760 hours.   ProBNP (last 3 results) No results for input(s): PROBNP in the last 8760 hours.   Other Studies Reviewed Today:  CORONARY CT IMPRESSION 02/2018: 1. Coronary artery calcium score 15 Agatston units. This places the patient in the 25th percentile for age and gender, suggesting low risk for future cardiac events.  2.  Nonobstructive plaque in the proximal and mid LAD.  Dalton Mclean   Electronically Signed   By: Loralie Champagne M.D.   On: 02/17/2018 18:36   Echo Study Conclusions from 2013  - Left ventricle: The cavity size was normal. Wall thickness was increased in a pattern of mild LVH. Systolic function was normal. The estimated ejection fraction was in the range of 55% to 60%. Wall motion was normal; there were no regional wall motion abnormalities. Doppler parameters are consistent with abnormal left ventricular relaxation (grade 1 diastolic dysfunction). - Atrial septum: No defect or patent foramen ovale was identified.  Assessment/Plan:  1. Non obstructive CAD - recent coronary CT noted - needs continued CV risk factor modification.   2. HTN - BP ok on current regimen - no changes made.   3. HLD - on 3 times a week statin - lab from June noted.   4. COVID-19 Education: The signs and symptoms of COVID-19 were discussed with the patient and how to seek care for testing (follow up with PCP or arrange E-visit).  The importance of social distancing, staying at home, hand hygiene and wearing a mask when out in public were discussed today.  Current medicines are reviewed with the patient today.  The patient does not have concerns regarding medicines other than what has been noted above.  The following changes have been made:  See above.  Labs/ tests ordered today include:    Orders Placed This Encounter  Procedures  . EKG 12-Lead     Disposition:   FU with me in 12 months.   Patient is agreeable to this plan and will call if any problems  develop in the interim.   SignedTruitt Merle, NP  12/23/2018 9:46 AM  [facility] [address] [place], [place]  [address] Phone: [phone] Fax: [phone]

## 2018-12-23 ENCOUNTER — Ambulatory Visit (INDEPENDENT_AMBULATORY_CARE_PROVIDER_SITE_OTHER): Payer: Medicare Other | Admitting: Nurse Practitioner

## 2018-12-23 ENCOUNTER — Other Ambulatory Visit: Payer: Self-pay

## 2018-12-23 ENCOUNTER — Encounter: Payer: Self-pay | Admitting: Nurse Practitioner

## 2018-12-23 VITALS — BP 130/70 | HR 78 | Ht 69.5 in | Wt 179.8 lb

## 2018-12-23 DIAGNOSIS — I2583 Coronary atherosclerosis due to lipid rich plaque: Secondary | ICD-10-CM

## 2018-12-23 DIAGNOSIS — I10 Essential (primary) hypertension: Secondary | ICD-10-CM | POA: Diagnosis not present

## 2018-12-23 DIAGNOSIS — E7849 Other hyperlipidemia: Secondary | ICD-10-CM | POA: Diagnosis not present

## 2018-12-23 DIAGNOSIS — I2510 Atherosclerotic heart disease of native coronary artery without angina pectoris: Secondary | ICD-10-CM | POA: Diagnosis not present

## 2018-12-23 NOTE — Patient Instructions (Addendum)
After Visit Summary:  We will be checking the following labs today - NONE   Medication Instructions:    Continue with your current medicines.    If you need a refill on your cardiac medications before your next appointment, please call your pharmacy.     Testing/Procedures To Be Arranged:  N/A  Follow-Up:   See me in 12 months    At [facility], you and your health needs are our priority.  As part of our continuing mission to provide you with exceptional heart care, we have created designated Provider Care Teams.  These Care Teams include your primary Cardiologist (physician) and Advanced Practice Providers (APPs -  Physician Assistants and Nurse Practitioners) who all work together to provide you with the care you need, when you need it.  Special Instructions:  . Stay safe, stay home, wash your hands for at least 20 seconds and wear a mask when out in public.  . It was good to talk with you today.    Call the [place] office at [phone] if you have any questions, problems or concerns.

## 2018-12-27 ENCOUNTER — Other Ambulatory Visit: Payer: Self-pay | Admitting: Nurse Practitioner

## 2019-02-20 ENCOUNTER — Other Ambulatory Visit: Payer: Self-pay

## 2019-02-20 ENCOUNTER — Ambulatory Visit (INDEPENDENT_AMBULATORY_CARE_PROVIDER_SITE_OTHER): Payer: Medicare Other

## 2019-02-20 DIAGNOSIS — Z23 Encounter for immunization: Secondary | ICD-10-CM | POA: Diagnosis not present

## 2019-02-28 ENCOUNTER — Other Ambulatory Visit: Payer: Self-pay | Admitting: Nurse Practitioner

## 2019-03-26 ENCOUNTER — Ambulatory Visit (INDEPENDENT_AMBULATORY_CARE_PROVIDER_SITE_OTHER): Payer: Medicare Other | Admitting: Internal Medicine

## 2019-03-26 ENCOUNTER — Encounter: Payer: Self-pay | Admitting: Internal Medicine

## 2019-03-26 DIAGNOSIS — S0990XA Unspecified injury of head, initial encounter: Secondary | ICD-10-CM

## 2019-03-26 HISTORY — DX: Unspecified injury of head, initial encounter: S09.90XA

## 2019-03-26 NOTE — Assessment & Plan Note (Signed)
He fell while out of town 1 week ago.  Landed on the right side of his face.  No LOC  Has some swelling at the right eyebrow, bilateral orbital swelling, fogginess and irritability Had nausea and vomiting 1 day No headaches, lightheadedness, dizziness, confusion and symptoms are improving No need for imaging Advised him to work from home the next couple of days and take it easy Bruising and swelling of the face should improve over the next several days Can continue local wound care for the laceration on his forehead Advised him to call with any questions or concerns

## 2019-03-26 NOTE — Progress Notes (Addendum)
Virtual Visit via Video Note  I connected with Nicholas Paul on 03/26/19 at  8:45 AM EST by a video enabled telemedicine application and verified that I am speaking with the correct person using two identifiers.   I discussed the limitations of evaluation and management by telemedicine and the availability of in person appointments. The patient expressed understanding and agreed to proceed.  Present for the visit:  Myself, Dr Billey Gosling, [name].  The patient is currently at home and I am at home.    No referring provider.    History of Present Illness: This is an acute visit for fall, head injury.  He had a fall 11/6, 1 week ago, while out of town.  He tripped in a dark parking lot and hit the right side of his head.  There is no LOC.  He has several bruises on his face.  He thinks he may have a possible concussion and was concerned about the persistent swelling over his right eyebrow, which is why he made the appointment.  He has swelling at the right eyebrow.  It has been persistent, but it has improved.  He has some tenderness there, but nothing significant with light to moderate palpation.  He has bilateral orbital swelling.  He has not had any headaches, lightheadedness or dizziness since the fall.  The next day after the fall he played golf.  His right eye was swollen shut that day.  He did not feel like eating much and had some soda and some crackers only.  After he was done playing golf he did have some nausea and vomited.  He has not had any nausea since then.  The next day he felt okay.  He still had no headache, except for when he laid down.  He has been putting an antibacterial on his wound at the eyebrow and keeping it covered.  He has had some mild pressure behind the right eye.  He denies any changes in vision.  The next day he was able to drive home and he just relax.  The past 3 days he has gone to work and not had any difficulty.  He admits that he feels a little  foggy and has been a little irritable.  His eyes are watering more than usual.  He was unsure if he had a concussion or there is any other concerns.  He did not want to go to the emergency room while he was out of town.    ROS Per HPI   Social History   Socioeconomic History  . Marital status: Married    Spouse name: Not on file  . Number of children: 2  . Years of education: Not on file  . Highest education level: Not on file  Occupational History    Employer: [employer]  . Financial resource strain: Not on file  . Food insecurity    Worry: Not on file    Inability: Not on file  . Transportation needs    Medical: Not on file    Non-medical: Not on file  Tobacco Use  . Smoking status: Never Smoker  . Smokeless tobacco: Never Used  Substance and Sexual Activity  . Alcohol use: Yes    Alcohol/week: 8.0 standard drinks    Types: 3 Glasses of wine, 5 Cans of beer per week    Comment: social  . Drug use: No  . Sexual activity: Yes  Lifestyle  . Physical activity  Days per week: Not on file    Minutes per session: Not on file  . Stress: Not on file  Relationships  . Social Herbalist on phone: Not on file    Gets together: Not on file    Attends religious service: Not on file    Active member of club or organization: Not on file    Attends meetings of clubs or organizations: Not on file    Relationship status: Not on file  Other Topics Concern  . Not on file  Social History Narrative  . Not on file     Observations/Objective: Appears well in NAD Right eyebrow swelling and bruised Bilateral orbital swelling.  Eyes open, conjunctive are normal Mood and affect normal.  Thought process and judgment normal. No obvious neurological deficits  Assessment and Plan:  See Problem List for Assessment and Plan of chronic medical problems.   Follow Up Instructions:    I discussed the assessment and treatment plan with the patient.  The patient was provided an opportunity to ask questions and all were answered. The patient agreed with the plan and demonstrated an understanding of the instructions.   The patient was advised to call back or seek an in-person evaluation if the symptoms worsen or if the condition fails to improve as anticipated.    Binnie Rail, MD

## 2019-03-30 IMAGING — CT CT HEART MORP W/ CTA COR W/ SCORE W/ CA W/CM &/OR W/O CM
4 of 7 series · 8 of 20 positions shown, 9 images · non-contrast
Comparison: 05/03/2011

CLINICAL DATA: Chest pain

EXAM:
Cardiac CTA
MEDICATIONS:
Sub lingual nitro. 4mg x 2
TECHNIQUE: The patient was scanned on a Siemens [REDACTED]ice scanner. Gantry
rotation speed was 250 msecs. Collimation was 0.6 mm. A 100 kV
prospective scan was triggered in the ascending thoracic aorta at
35-75% of the R-R interval. Average HR during the scan was 60 bpm.
The 3D data set was interpreted on a dedicated work station using
MPR, MIP and VRT modes. A total of 80cc of contrast was used.

[Series 7: best diast 72 % · axial · 0.39mm/px · z∈[+1179,+1216]mm · 2 of 276 slices shown, 3 images]
[im 92/276  vessel]
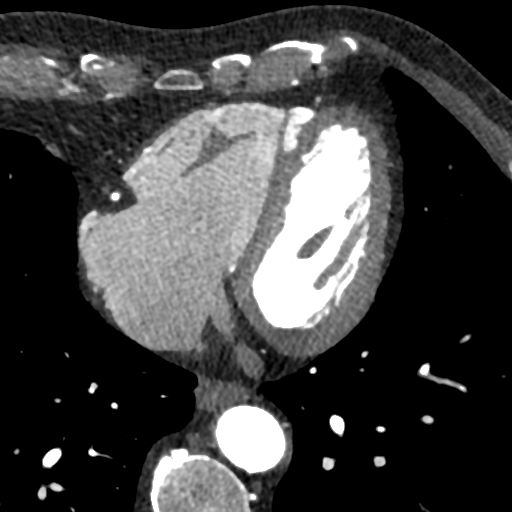
[im 92/276  lung]
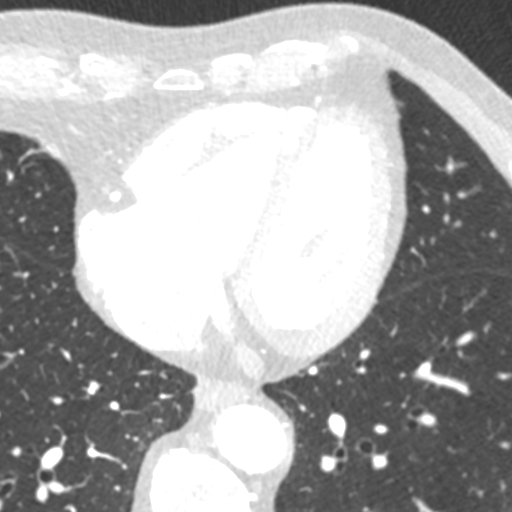
[im 184/276  vessel]
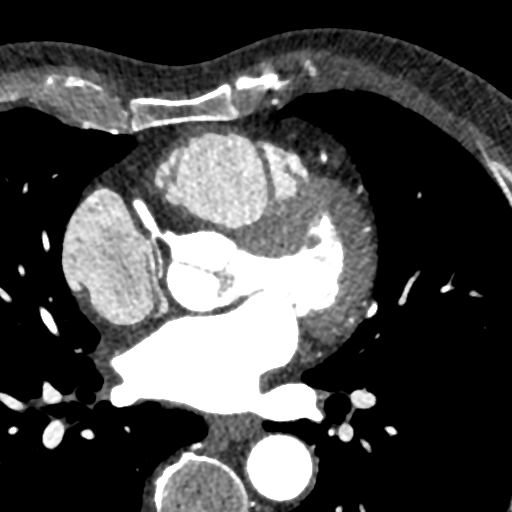

[Series 8: best syst 31 % · axial · 0.39mm/px · z∈[+1179,+1216]mm · 2 of 276 slices shown]
[im 92/276  vessel]
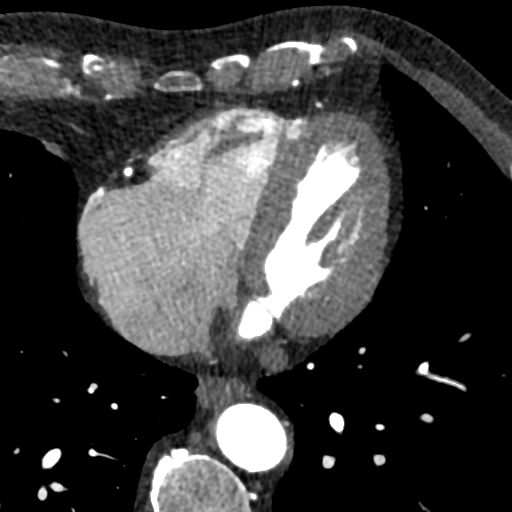
[im 184/276  vessel]
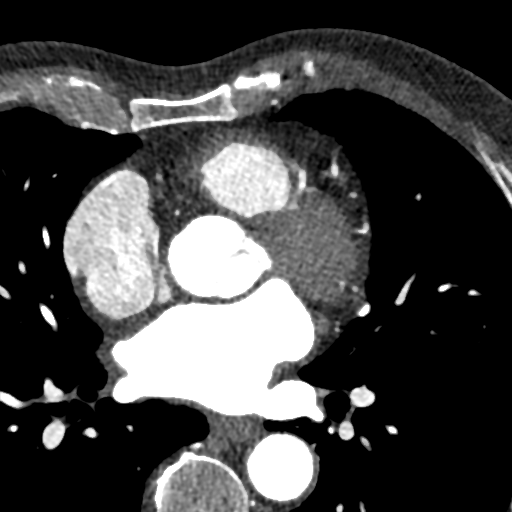

[Series 9: ts diast sharp 72 % · axial · 0.39mm/px · z∈[+1179,+1216]mm · 2 of 276 slices shown]
[im 92/276  lung]
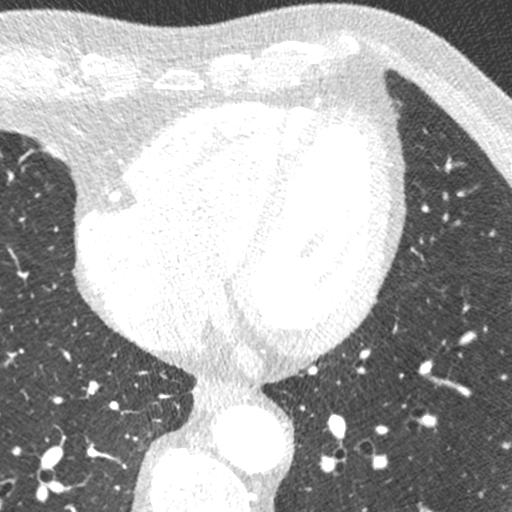
[im 184/276  lung]
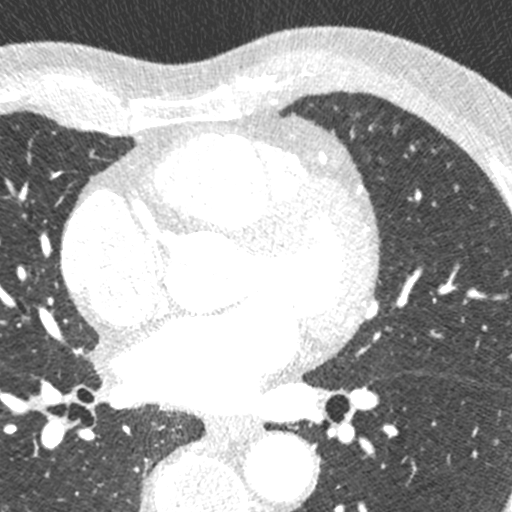

[Series 10: ts syst sharp 31 % · axial · 0.39mm/px · z∈[+1179,+1216]mm · 2 of 276 slices shown]
[im 92/276  lung]
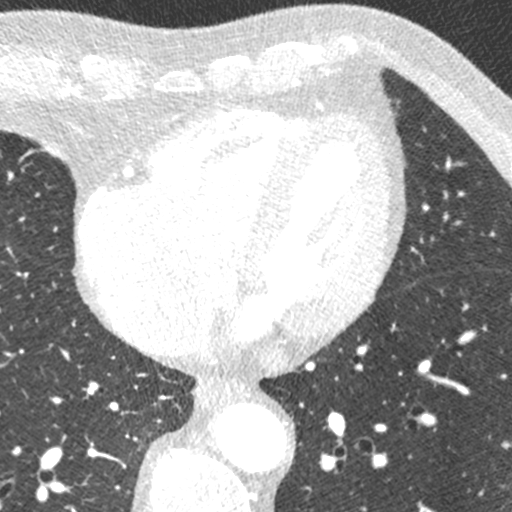
[im 184/276  lung]
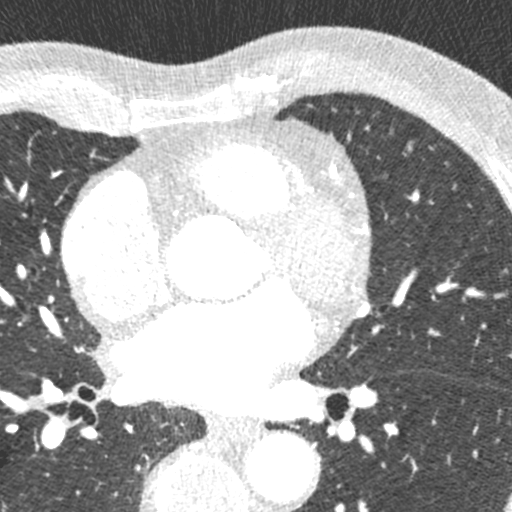

[8 of 20 positions shown; findings below may reference images not displayed]

FINDINGS: Non-cardiac: See separate report from [REDACTED].

Pulmonary veins drain normally to the left atrium.

Calcium Score: 15 Agatston units.

Coronary Arteries: Right dominant with no anomalies

LM: No plaque or stenosis.

LAD system: Mixed plaque proximal and mid LAD without significant
stenosis.

Circumflex system: The AV LCx is diminutive. LCx territory is mainly
covered by diagonals off the LAD.

RCA system: No plaque or stenosis.
IMPRESSION: 1. Coronary artery calcium score 15 Agatston units. This places the
patient in the 25th percentile for age and gender, suggesting low
risk for future cardiac events.

2.  Nonobstructive plaque in the proximal and mid LAD.

Akim Beckman

EXAM:
OVER-READ INTERPRETATION  CT CHEST

The following report is an over-read performed by radiologist Dr.
Sasao Togami [REDACTED] on 02/17/2018. This over-read
does not include interpretation of cardiac or coronary anatomy or
pathology. The coronary CTA interpretation by the cardiologist is
attached.
FINDINGS: Vascular: Heart is normal size.  Visualized aorta normal caliber.

Mediastinum/Nodes: No adenopathy in the lower mediastinum or hila.

Lungs/Pleura: 4 mm nodule in the anterior right middle lobe on image
20 is stable since prior study. No new pulmonary nodules. No
confluent opacity or effusion.

Upper Abdomen: Imaging into the upper abdomen shows no acute
findings.

Musculoskeletal: Chest wall soft tissues are unremarkable. No acute
bony abnormality.
IMPRESSION: Stable 4 mm right middle lobe nodule compatible with benign nodule.

No acute or significant extracardiac abnormality.

## 2019-04-21 DIAGNOSIS — R972 Elevated prostate specific antigen [PSA]: Secondary | ICD-10-CM | POA: Diagnosis not present

## 2019-06-09 DIAGNOSIS — R3914 Feeling of incomplete bladder emptying: Secondary | ICD-10-CM | POA: Diagnosis not present

## 2019-06-09 DIAGNOSIS — R3912 Poor urinary stream: Secondary | ICD-10-CM | POA: Diagnosis not present

## 2019-06-09 DIAGNOSIS — N401 Enlarged prostate with lower urinary tract symptoms: Secondary | ICD-10-CM | POA: Diagnosis not present

## 2019-06-10 ENCOUNTER — Other Ambulatory Visit: Payer: Self-pay | Admitting: Urology

## 2019-06-11 NOTE — Progress Notes (Signed)
Spoke with patient by phone and patient stated surgery is July 14, 2019 and patient will call connie to get correct 07-14-2019 surgery date.

## 2019-06-12 ENCOUNTER — Other Ambulatory Visit (HOSPITAL_COMMUNITY): Payer: Self-pay

## 2019-06-12 ENCOUNTER — Encounter: Payer: Self-pay | Admitting: Internal Medicine

## 2019-07-09 ENCOUNTER — Other Ambulatory Visit: Payer: Self-pay

## 2019-07-09 ENCOUNTER — Encounter (HOSPITAL_BASED_OUTPATIENT_CLINIC_OR_DEPARTMENT_OTHER): Payer: Self-pay | Admitting: Urology

## 2019-07-09 NOTE — Progress Notes (Signed)
PLEASE NOTE THAT ANY HEALTH INFORMATION IN CARE EVERYWHERE IS NOT THIS PT (THIS PT HAS DIFFERENT DOB AND MIDDLE INITIAL)   Spoke w/ via phone for pre-op interview--- PT Lab needs dos----   Istat 8           Lab results------ current ekg in chart/ epic COVID test ------ 07-10-2019 @ 1200 Arrive at ------- 1015 NPO after ------ [place] Medications to take morning of surgery ----- Flomax, Norvasc, Pepcid Diabetic medication ----- n/a Patient Special Instructions ----- n/a Pre-Op special Istructions ----- n/a Patient verbalized understanding of instructions that were given at this phone interview. Patient denies shortness of breath, chest pain, fever, cough a this phone interview.   Anesthesia :  PCP:  Dr Marjory Sneddon Cardiologist :  Truitt Merle NP (lov 12-23-2018 epic) Chest x-ray :  05-02-2011 epic CTA:  02-17-2018 epic EKG : 12-23-2018 epic Echo :  06-20-2011  Epic Stress test:  06-09-2003  epic Cardiac Cath :  no Sleep Study/ CPAP :  NO Fasting Blood Sugar : / Checks Blood Sugar -- times a day:    N/A  Blood Thinner/ Instructions Maryjane Hurter Dose:  NO ASA / Instructions/ Last Dose :  ASA 81mg /  Per pt given instructions from dr eskridge to stop a week  prior to surgery,  Last dose 07-07-2019

## 2019-07-10 ENCOUNTER — Other Ambulatory Visit (HOSPITAL_COMMUNITY)
Admission: RE | Admit: 2019-07-10 | Discharge: 2019-07-10 | Disposition: A | Discharge status: Home / Self Care | Payer: Medicare Other | Source: Ambulatory Visit | Attending: Urology | Admitting: Urology

## 2019-07-10 DIAGNOSIS — Z20822 Contact with and (suspected) exposure to covid-19: Secondary | ICD-10-CM | POA: Insufficient documentation

## 2019-07-10 DIAGNOSIS — Z01812 Encounter for preprocedural laboratory examination: Secondary | ICD-10-CM | POA: Insufficient documentation

## 2019-07-11 LAB — SARS CORONAVIRUS 2 (TAT 6-24 HRS): SARS Coronavirus 2: NEGATIVE

## 2019-07-14 ENCOUNTER — Ambulatory Visit (HOSPITAL_BASED_OUTPATIENT_CLINIC_OR_DEPARTMENT_OTHER)
Admission: RE | Admit: 2019-07-14 | Discharge: 2019-07-14 | Disposition: A | Discharge status: Home / Self Care | Payer: Medicare Other | Attending: Urology | Admitting: Urology

## 2019-07-14 ENCOUNTER — Encounter (HOSPITAL_BASED_OUTPATIENT_CLINIC_OR_DEPARTMENT_OTHER): Payer: Self-pay | Admitting: Urology

## 2019-07-14 ENCOUNTER — Other Ambulatory Visit: Payer: Self-pay

## 2019-07-14 ENCOUNTER — Ambulatory Visit (HOSPITAL_BASED_OUTPATIENT_CLINIC_OR_DEPARTMENT_OTHER): Payer: Medicare Other | Admitting: Anesthesiology

## 2019-07-14 ENCOUNTER — Encounter (HOSPITAL_BASED_OUTPATIENT_CLINIC_OR_DEPARTMENT_OTHER)
Admission: RE | Disposition: A | Discharge status: Home / Self Care | Payer: Self-pay | Source: Home / Self Care | Attending: Urology

## 2019-07-14 DIAGNOSIS — R350 Frequency of micturition: Secondary | ICD-10-CM | POA: Diagnosis not present

## 2019-07-14 DIAGNOSIS — I2510 Atherosclerotic heart disease of native coronary artery without angina pectoris: Secondary | ICD-10-CM | POA: Diagnosis not present

## 2019-07-14 DIAGNOSIS — E785 Hyperlipidemia, unspecified: Secondary | ICD-10-CM | POA: Insufficient documentation

## 2019-07-14 DIAGNOSIS — R3912 Poor urinary stream: Secondary | ICD-10-CM | POA: Insufficient documentation

## 2019-07-14 DIAGNOSIS — N401 Enlarged prostate with lower urinary tract symptoms: Secondary | ICD-10-CM | POA: Diagnosis not present

## 2019-07-14 DIAGNOSIS — Z20822 Contact with and (suspected) exposure to covid-19: Secondary | ICD-10-CM | POA: Insufficient documentation

## 2019-07-14 DIAGNOSIS — K589 Irritable bowel syndrome without diarrhea: Secondary | ICD-10-CM | POA: Diagnosis not present

## 2019-07-14 DIAGNOSIS — Z7982 Long term (current) use of aspirin: Secondary | ICD-10-CM | POA: Diagnosis not present

## 2019-07-14 DIAGNOSIS — K219 Gastro-esophageal reflux disease without esophagitis: Secondary | ICD-10-CM | POA: Diagnosis not present

## 2019-07-14 DIAGNOSIS — Z79899 Other long term (current) drug therapy: Secondary | ICD-10-CM | POA: Insufficient documentation

## 2019-07-14 DIAGNOSIS — I10 Essential (primary) hypertension: Secondary | ICD-10-CM | POA: Diagnosis not present

## 2019-07-14 DIAGNOSIS — N138 Other obstructive and reflux uropathy: Secondary | ICD-10-CM

## 2019-07-14 HISTORY — DX: Benign prostatic hyperplasia without lower urinary tract symptoms: N40.0

## 2019-07-14 HISTORY — PX: THULIUM LASER TURP (TRANSURETHRAL RESECTION OF PROSTATE): SHX6744

## 2019-07-14 LAB — POCT I-STAT, CHEM 8
BUN: 13 mg/dL (ref 8–23)
Calcium, Ion: 1.19 mmol/L (ref 1.15–1.40)
Chloride: 101 mmol/L (ref 98–111)
Creatinine, Ser: 1 mg/dL (ref 0.61–1.24)
Glucose, Bld: 112 mg/dL — ABNORMAL HIGH (ref 70–99)
HCT: 44 % (ref 39.0–52.0)
Hemoglobin: 15 g/dL (ref 13.0–17.0)
Potassium: 4 mmol/L (ref 3.5–5.1)
Sodium: 139 mmol/L (ref 135–145)
TCO2: 28 mmol/L (ref 22–32)

## 2019-07-14 SURGERY — THULIUM LASER TURP (TRANSURETHRAL RESECTION OF PROSTATE)
Anesthesia: General | Site: Prostate

## 2019-07-14 MED ORDER — ACETAMINOPHEN 500 MG PO TABS
1000.0000 mg | ORAL_TABLET | Freq: Once | ORAL | Status: AC
  Administered 2019-07-14: 1000 mg via ORAL
  Filled 2019-07-14: qty 2

## 2019-07-14 MED ORDER — ONDANSETRON HCL 4 MG/2ML IJ SOLN
INTRAMUSCULAR | Status: DC | PRN
  Administered 2019-07-14: 4 mg via INTRAVENOUS

## 2019-07-14 MED ORDER — FENTANYL CITRATE (PF) 100 MCG/2ML IJ SOLN
INTRAMUSCULAR | Status: DC | PRN
  Administered 2019-07-14 (×2): 50 ug via INTRAVENOUS

## 2019-07-14 MED ORDER — LACTATED RINGERS IV SOLN
INTRAVENOUS | Status: DC
  Filled 2019-07-14: qty 1000

## 2019-07-14 MED ORDER — PROPOFOL 10 MG/ML IV BOLUS
INTRAVENOUS | Status: DC | PRN
  Administered 2019-07-14: 200 mg via INTRAVENOUS
  Administered 2019-07-14: 100 mg via INTRAVENOUS

## 2019-07-14 MED ORDER — SODIUM CHLORIDE 0.9 % IR SOLN
Status: DC | PRN
  Administered 2019-07-14 (×2): 6000 mL via INTRAVESICAL

## 2019-07-14 MED ORDER — TAMSULOSIN HCL 0.4 MG PO CAPS: 0.4000 mg | ORAL_CAPSULE | Freq: Every day | ORAL | 0 refills | Status: DC

## 2019-07-14 MED ORDER — ACETAMINOPHEN 500 MG PO TABS
ORAL_TABLET | ORAL | Status: AC
  Filled 2019-07-14: qty 2

## 2019-07-14 MED ORDER — CEPHALEXIN 500 MG PO CAPS: 500.0000 mg | ORAL_CAPSULE | Freq: Four times a day (QID) | ORAL | 0 refills | Status: DC

## 2019-07-14 MED ORDER — DEXAMETHASONE SODIUM PHOSPHATE 10 MG/ML IJ SOLN
INTRAMUSCULAR | Status: AC
  Filled 2019-07-14: qty 1

## 2019-07-14 MED ORDER — ONDANSETRON HCL 4 MG/2ML IJ SOLN
INTRAMUSCULAR | Status: AC
  Filled 2019-07-14: qty 2

## 2019-07-14 MED ORDER — CEFAZOLIN SODIUM-DEXTROSE 2-4 GM/100ML-% IV SOLN
INTRAVENOUS | Status: AC
  Filled 2019-07-14: qty 100

## 2019-07-14 MED ORDER — LIDOCAINE 2% (20 MG/ML) 5 ML SYRINGE
INTRAMUSCULAR | Status: DC | PRN
  Administered 2019-07-14: 60 mg via INTRAVENOUS

## 2019-07-14 MED ORDER — MIDAZOLAM HCL 2 MG/2ML IJ SOLN
INTRAMUSCULAR | Status: DC | PRN
  Administered 2019-07-14: 2 mg via INTRAVENOUS

## 2019-07-14 MED ORDER — KETOROLAC TROMETHAMINE 30 MG/ML IJ SOLN
INTRAMUSCULAR | Status: AC
  Filled 2019-07-14: qty 1

## 2019-07-14 MED ORDER — FENTANYL CITRATE (PF) 100 MCG/2ML IJ SOLN
INTRAMUSCULAR | Status: AC
  Filled 2019-07-14: qty 2

## 2019-07-14 MED ORDER — LIDOCAINE 2% (20 MG/ML) 5 ML SYRINGE
INTRAMUSCULAR | Status: AC
  Filled 2019-07-14: qty 5

## 2019-07-14 MED ORDER — FENTANYL CITRATE (PF) 100 MCG/2ML IJ SOLN
25.0000 ug | INTRAMUSCULAR | Status: DC | PRN
  Filled 2019-07-14: qty 1

## 2019-07-14 MED ORDER — MIDAZOLAM HCL 2 MG/2ML IJ SOLN
INTRAMUSCULAR | Status: AC
  Filled 2019-07-14: qty 2

## 2019-07-14 MED ORDER — CEFAZOLIN SODIUM-DEXTROSE 2-4 GM/100ML-% IV SOLN
2.0000 g | Freq: Once | INTRAVENOUS | Status: AC
  Administered 2019-07-14: 2 g via INTRAVENOUS
  Filled 2019-07-14: qty 100

## 2019-07-14 MED ORDER — PROPOFOL 10 MG/ML IV BOLUS
INTRAVENOUS | Status: AC
  Filled 2019-07-14: qty 20

## 2019-07-14 MED ORDER — DEXAMETHASONE SODIUM PHOSPHATE 10 MG/ML IJ SOLN
INTRAMUSCULAR | Status: DC | PRN
  Administered 2019-07-14: 5 mg via INTRAVENOUS

## 2019-07-14 SURGICAL SUPPLY — 21 items
BAG DRAIN URO-CYSTO SKYTR STRL (DRAIN) ×2 IMPLANT
BAG DRN RND TRDRP ANRFLXCHMBR (UROLOGICAL SUPPLIES) ×1
BAG DRN UROCATH (DRAIN) ×1
BAG URINE DRAIN 2000ML AR STRL (UROLOGICAL SUPPLIES) ×2 IMPLANT
CATH COUDE FOLEY 2W 5CC 18FR (CATHETERS) IMPLANT
CATH COUDE FOLEY 2W 5CC 20FR (CATHETERS) ×1 IMPLANT
CATH FOLEY 3WAY 30CC 22F (CATHETERS) IMPLANT
CLOTH BEACON ORANGE TIMEOUT ST (SAFETY) ×2 IMPLANT
GLOVE BIO SURGEON STRL SZ7.5 (GLOVE) ×2 IMPLANT
GLOVE BIO SURGEON STRL SZ8 (GLOVE) ×1 IMPLANT
GOWN STRL REUS W/TWL XL LVL3 (GOWN DISPOSABLE) ×4 IMPLANT
HOLDER FOLEY CATH W/STRAP (MISCELLANEOUS) ×1 IMPLANT
IV NS IRRIG 3000ML ARTHROMATIC (IV SOLUTION) ×5 IMPLANT
KIT TURNOVER CYSTO (KITS) ×2 IMPLANT
LASER REVOLIX PROCEDURE (MISCELLANEOUS) ×2 IMPLANT
LOOP CUT BIPOLAR 24F LRG (ELECTROSURGICAL) IMPLANT
MANIFOLD NEPTUNE II (INSTRUMENTS) ×2 IMPLANT
PACK CYSTO (CUSTOM PROCEDURE TRAY) ×2 IMPLANT
SYR 30ML LL (SYRINGE) ×1 IMPLANT
TUBE CONNECTING 12X1/4 (SUCTIONS) ×1 IMPLANT
TUBING UROLOGY SET (TUBING) ×1 IMPLANT

## 2019-07-14 NOTE — Op Note (Signed)
Preoperative diagnosis: BPH with weak stream Postoperative diagnosis: Same  Procedure: Thulium laser vaporization of the prostate  Surgeon: Junious Silk  Anesthesia: General  Indication for procedure: Nicholas Paul is a 69 year old male with a history of BPH and lower urinary tract symptoms.  He did not tolerate 5 alpha reductase inhibitors and was on max alpha-blocker.  He wished for more definitive treatment of his bothersome and continued lower urinary tract symptoms.  Findings: On exam under anesthesia the penis was circumcised and without mass or lesion.  The prostate on digital rectal exam was about 50 g and smooth without hard area or nodule.  On cystoscopy he had a very high bladder neck and median lobe and short obstructing lateral lobes.  Bladder moderately trabeculated.  No stone or foreign body in the bladder.  Ureteral orifice ease were in the normal orthotopic position with clear reflux.  No mucosal lesions noted.  Description of procedure: After consent was obtained patient brought the operating room.  After adequate anesthesia was placed lithotomy position and prepped and draped in the usual sterile fashion.  A timeout was performed to confirm the patient and procedure.  Cystoscope was passed per urethra and the bladder carefully inspected.  Utilizing the continuous flow laser sheath a 1000 m laser fiber was passed.  I started at the 5 o'clock position on the left and incised down to the bladder neck.  I had identified and marked both ureteral orifice ease.  I then went to the right side about 7:00 and made a similar incision and brought these down to the verumontanum.  The median lobe was then vaporized down to the bladder neck.  This dropped in the lateral lobes and working from anterior to posterior they were vaporized down to the verumontanum.  There was some residual apical tissue but I left this in place.  Careful inspection revealed a good channel excellent hemostasis at low pressure.   Some debris was washed out of the bladder.  Ureteral orifice ease again normal.  The scope was backed out and a 20 [place] coud catheter passed in left to gravity drainage.  Urine was clear.  I put 15 cc in the balloon.  Complications: None  Blood loss minimal  Specimens: none  Drains: 20 French coud catheter  Disposition: Patient stable to PACU

## 2019-07-14 NOTE — Anesthesia Postprocedure Evaluation (Signed)
Anesthesia Post Note  Patient: Nicholas Paul  Procedure(s) Performed: THULIUM LASER TURP (TRANSURETHRAL RESECTION OF PROSTATE) (N/A Prostate)     Patient location during evaluation: PACU Anesthesia Type: General Level of consciousness: awake and alert Pain management: pain level controlled Vital Signs Assessment: post-procedure vital signs reviewed and stable Respiratory status: spontaneous breathing, nonlabored ventilation, respiratory function stable and patient connected to nasal cannula oxygen Cardiovascular status: blood pressure returned to baseline and stable Postop Assessment: no apparent nausea or vomiting Anesthetic complications: no    Last Vitals:  Vitals:   07/14/19 1330 07/14/19 1345  BP: 125/77 126/73  Pulse: 69 69  Resp: 13 15  Temp:    SpO2: 94% 95%    Last Pain:  Vitals:   07/14/19 1330  TempSrc:   PainSc: 0-No pain                 Jullian Clayson L [place] Wachter

## 2019-07-14 NOTE — Transfer of Care (Signed)
Immediate Anesthesia Transfer of Care Note  Patient: Nicholas Paul  Procedure(s) Performed: THULIUM LASER TURP (TRANSURETHRAL RESECTION OF PROSTATE) (N/A Prostate)  Patient Location: PACU  Anesthesia Type:General  Level of Consciousness: awake, alert , oriented and patient cooperative  Airway & Oxygen Therapy: Patient Spontanous Breathing and Patient connected to nasal cannula oxygen  Post-op Assessment: Report given to RN and Post -op Vital signs reviewed and stable  Post vital signs: Reviewed and stable  Last Vitals:  Vitals Value Taken Time  BP    Temp    Pulse 79 07/14/19 1255  Resp    SpO2 97 % 07/14/19 1255  Vitals shown include unvalidated device data.  Last Pain:  Vitals:   07/14/19 1053  TempSrc: Oral  PainSc: 0-No pain      Patients Stated Pain Goal: 6 ([id number])  Complications: No apparent anesthesia complications

## 2019-07-14 NOTE — Anesthesia Procedure Notes (Signed)
Procedure Name: LMA Insertion Date/Time: 07/14/2019 11:45 AM Performed by: Wanita Chamberlain, CRNA Pre-anesthesia Checklist: Patient being monitored, Suction available, Emergency Drugs available, Patient identified and Timeout performed Patient Re-evaluated:Patient Re-evaluated prior to induction Oxygen Delivery Method: Circle system utilized Preoxygenation: Pre-oxygenation with 100% oxygen Induction Type: IV induction Ventilation: Mask ventilation without difficulty LMA: LMA inserted LMA Size: 4.0 Number of attempts: 1 Airway Equipment and Method: Bite block Placement Confirmation: breath sounds checked- equal and bilateral,  CO2 detector and positive ETCO2 Tube secured with: Tape Dental Injury: Teeth and Oropharynx as per pre-operative assessment

## 2019-07-14 NOTE — Anesthesia Preprocedure Evaluation (Addendum)
Anesthesia Evaluation  Patient identified by MRN, date of birth, ID band Patient awake    Reviewed: Allergy & Precautions, NPO status , Patient's Chart, lab work & pertinent test results  Airway Mallampati: II  TM Distance: >3 FB Neck ROM: Full    Dental no notable dental hx. (+) Teeth Intact, Dental Advisory Given   Pulmonary neg pulmonary ROS,    Pulmonary exam normal breath sounds clear to auscultation       Cardiovascular hypertension, Pt. on medications + CAD  Normal cardiovascular exam Rhythm:Regular Rate:Normal  HLD  TTE 2013 Normal LVEF, no significant valvular abnormalities  Coronary CT 2019 1. Coronary artery calcium score 15 Agatston units. This places the patient in the 25th percentile for age and gender, suggesting low risk for future cardiac events. 2.  Nonobstructive plaque in the proximal and mid LAD. showing 50% soft stenosis in the RCA with otherwise patent vessels and calcium score of 0   Neuro/Psych negative neurological ROS  negative psych ROS   GI/Hepatic Neg liver ROS, GERD  Medicated,  Endo/Other  negative endocrine ROS  Renal/GU negative Renal ROS  negative genitourinary   Musculoskeletal negative musculoskeletal ROS (+)   Abdominal   Peds  Hematology negative hematology ROS (+)   Anesthesia Other Findings   Reproductive/Obstetrics                           Anesthesia Physical Anesthesia Plan  ASA: III  Anesthesia Plan: General   Post-op Pain Management:    Induction: Intravenous  PONV Risk Score and Plan: 2 and Ondansetron, Dexamethasone and Midazolam  Airway Management Planned: LMA  Additional Equipment:   Intra-op Plan:   Post-operative Plan: Extubation in OR  Informed Consent: I have reviewed the patients History and Physical, chart, labs and discussed the procedure including the risks, benefits and alternatives for the proposed anesthesia  with the patient or authorized representative who has indicated his/her understanding and acceptance.     Dental advisory given  Plan Discussed with: CRNA  Anesthesia Plan Comments:         Anesthesia Quick Evaluation

## 2019-07-14 NOTE — Discharge Instructions (Signed)
Prostate Laser Surgery, Care After  This sheet gives you information about how to care for yourself after your procedure. Your health care provider may also give you more specific instructions. If you have problems or questions, contact your health care provider. What can I expect after the procedure? For the first few weeks after the procedure:  You will feel a need to urinate often.  You may have blood in your urine.  You may feel a sudden need to urinate. Once your urinary catheter is removed, you may have a burning feeling when you urinate, especially at the end of urination. This feeling usually passes within 3-5 days. Follow these instructions at home: Activity  Return to your normal activities as told by your health care provider. Ask your health care provider what activities are safe for you.  Do not do vigorous exercise for 1 week or as told by your health care provider.  Do not lift anything that is heavier than 10 lb (4.5 kg) until your health care provider say it is safe.  Avoid sexual activity for 4-6 weeks or as told by your health care provider.  Do not ride in a car for extended periods of time for 1 month or as told by your health care provider.  Do not drive for 24 hours if you were given a medicine to help you relax (sedative). Diet  Eat foods that are high in fiber, such as fresh fruits and vegetables, whole grains, and beans.  Drink enough fluid to keep your urine clear or pale yellow. Medicines  Take over-the-counter and prescription medicines, including stool softeners, only as told by your health care provider.  If you were prescribed an antibiotic medicine, take it as told by your health care provider. Do not stop taking the antibiotic even if you start to feel better. General instructions   If you were given elastic support stockings, wear them as told by your health care provider.  Do not strain to have a bowel movement. Straining may lead to  bleeding from the prostate and cause clots to form and cause trouble urinating.  Keep all follow-up visits as told by your health care provider. This is important. Contact a health care provider if:  You have a fever or chills.  You have spasms or pain with the urinary catheter still in place.  Once the catheter has been removed, you experience difficulty starting your stream when attempting to urinate. Get help right away if:  There is a blockage in your catheter.  Your catheter has been removed and you are suddenly unable to urinate.  Your urine smells unusually bad.  You start to have blood clots in your urine.  The blood in your urine becomes persistent or gets thick.  You develop chest pains.  You develop shortness of breath.  You develop swelling or pain in your leg. Summary  You may notice urinary symptoms for a few weeks after your procedure.  Follow instructions from your health care provider regarding activity restrictions such as lifting, exercise, and sexual activity.  Contact your health care provider if you have any unusual symptoms during your recovery. This information is not intended to replace advice given to you by your health care provider. Make sure you discuss any questions you have with your health care provider. Document Revised: 04/12/2017 Document Reviewed: 12/16/2015 Elsevier Patient Education  [place] Instructions  Activity: Get plenty of rest for the remainder of the  day. A responsible adult should stay with you for 24 hours following the procedure.  For the next 24 hours, DO NOT: -Drive a car -Paediatric nurse -Drink alcoholic beverages -Take any medication unless instructed by your physician -Make any legal decisions or sign important papers.  Meals: Start with liquid foods such as gelatin or soup. Progress to regular foods as tolerated. Avoid greasy, spicy, heavy foods. If nausea and/or  vomiting occur, drink only clear liquids until the nausea and/or vomiting subsides. Call your physician if vomiting continues.  Special Instructions/Symptoms: Your throat may feel dry or sore from the anesthesia or the breathing tube placed in your throat during surgery. If this causes discomfort, gargle with warm salt water. The discomfort should disappear within 24 hours.  If you had a scopolamine patch placed behind your ear for the management of post- operative nausea and/or vomiting:  1. The medication in the patch is effective for 72 hours, after which it should be removed.  Wrap patch in a tissue and discard in the trash. Wash hands thoroughly with soap and water. 2. You may remove the patch earlier than 72 hours if you experience unpleasant side effects which may include dry mouth, dizziness or visual disturbances. 3. Avoid touching the patch. Wash your hands with soap and water after contact with the patch.

## 2019-07-14 NOTE — H&P (Signed)
H&P  Chief Complaint: BPH, weak stream  History of Present Illness: Nicholas Paul is a 69 year old male with a history of BPH and weak stream.  He tried finasteride and did not tolerate it and stopped it.  He has been on tamsulosin twice daily which she reports taking 0.8 mg p.o. twice daily.  His post void in the office was slightly elevated at 242 mL.  AUA symptom score was 15.  His PSA was 2.97.  He underwent cystoscopy and had an obstructing median lobe and lateral lobe hypertrophy.  He presents today for thulium laser vaporization of the prostate.  Past Medical History:  Diagnosis Date  . BPH (benign prostatic hyperplasia)    urologist--- dr Junious Silk  . Coronary artery disease cardiologist--- Cecille Rubin gerhardt NP   last CTA 02-17-2018  nonobstructive proximal and mid LAD, calcium score 15;   nuclear stress test 06-09-2003 in epic,  no ischemia w/ ef 59%  . GERD (gastroesophageal reflux disease)   . H/O: rheumatic fever    noted after tonsillectomy  . Hyperlipidemia   . Hypertension    followed by cardiology  . IBS (irritable bowel syndrome)    Dr Carlean Purl  . Internal hemorrhoids   . Pulmonary nodule    4 mm RML nodule on CT scan in 12/12. Needs follow up in one year.      Past Surgical History:  Procedure Laterality Date  . COLONOSCOPY W/ POLYPECTOMY  2006   internal hemorrhoids 2011  . TONSILLECTOMY AND ADENOIDECTOMY      Home Medications:  Medications Prior to Admission  Medication Sig Dispense Refill Last Dose  . amLODipine (NORVASC) 5 MG tablet TAKE 1 TABLET BY MOUTH EVERY DAY (Patient taking differently: Take 5 mg by mouth daily. ) 90 tablet 3 07/14/2019 at 0700  . aspirin EC 81 MG tablet Take 81 mg by mouth daily.   07/07/2019  . atorvastatin (LIPITOR) 10 MG tablet TAKE 1 TABLET BY MOUTH ON MONDAYS, WEDNESDAYS, AND FRIDAYS ONLY. (Patient taking differently: Take 10 mg by mouth 3 (three) times a week. TAKE 1 TABLET BY MOUTH ON MONDAYS, WEDNESDAYS, AND FRIDAYS ONLY.) 45 tablet 3  07/13/2019 at Unknown time  . Cholecalciferol (VITAMIN D) 125 MCG (5000 UT) CAPS Take 125 mcg by mouth daily.    Past Month at Unknown time  . Coenzyme Q10 (CO Q 10 PO) Take 1 capsule by mouth daily.   Past Month at Unknown time  . famotidine (PEPCID) 10 MG tablet Take 10 mg by mouth as needed for heartburn or indigestion.   Past Month at Unknown time  . hydrochlorothiazide (MICROZIDE) 12.5 MG capsule TAKE 1 CAPSULE BY MOUTH EVERY DAY (Patient taking differently: Take 12.5 mg by mouth daily. ) 90 capsule 3 Past Week at Unknown time  . Omega-3 Fatty Acids (OMEGA 3 PO) Take 1 capsule by mouth daily.    Past Month at Unknown time  . psyllium (METAMUCIL) 58.6 % packet Take 2 packets by mouth 2 (two) times daily.    07/13/2019 at Unknown time  . quinapril (ACCUPRIL) 40 MG tablet TAKE 1 TABLET BY MOUTH EVERY DAY (Patient taking differently: Take 40 mg by mouth at bedtime. TAKE 1 TABLET BY MOUTH EVERY DAY) 90 tablet 3 07/13/2019 at Unknown time  . tamsulosin (FLOMAX) 0.4 MG CAPS capsule Take 0.4 mg by mouth 2 (two) times daily.    07/14/2019 at 0700   Allergies:  Allergies  Allergen Reactions  . Doxazosin Mesylate Swelling    REACTION: edema on  the generic (07-09-2019  Per pt this reaction was approx. 1990s).   . Crestor [Rosuvastatin]     Crestor 5 mg qd caused leg myalgias ; no issue with Lipitor 10 mg M, W, & F    Family History  Problem Relation Age of Onset  . Cancer Father        bladder cancer  . Cancer Brother        bladder cancer  . Hypertension Brother   . Cancer Paternal Aunt        bladder cancer  . Hyperlipidemia Mother   . Hypertension Mother   . Stroke Neg Hx   . Diabetes Neg Hx   . Heart attack Neg Hx   . Colon cancer Neg Hx    Social History:  reports that he has never smoked. He has never used smokeless tobacco. He reports current alcohol use. He reports that he does not use drugs.  ROS: A complete review of systems was performed.  All systems are negative except for  pertinent findings as noted. Review of Systems  All other systems reviewed and are negative.    Physical Exam:  Vital signs in last 24 hours: Temp:  [97.4 F (36.3 C)] 97.4 F (36.3 C) (03/02 1053) Pulse Rate:  [75] 75 (03/02 1053) Resp:  [18] 18 (03/02 1053) BP: (146)/(84) 146/84 (03/02 1053) SpO2:  [100 %] 100 % (03/02 1053) Weight:  [82 kg] 82 kg (03/02 1053) General:  Alert and oriented, No acute distress HEENT: Normocephalic, atraumatic Cardiovascular: Regular rate and rhythm Lungs: Regular rate and effort Abdomen: Soft, nontender, nondistended, no abdominal masses Back: No CVA tenderness Extremities: No edema Neurologic: Grossly intact  Laboratory Data:  Results for orders placed or performed during the hospital encounter of 07/14/19 (from the past 24 hour(s))  I-STAT, chem 8     Status: Abnormal   Collection Time: 07/14/19 10:51 AM  Result Value Ref Range   Sodium 139 135 - 145 mmol/L   Potassium 4.0 3.5 - 5.1 mmol/L   Chloride 101 98 - 111 mmol/L   BUN 13 8 - 23 mg/dL   Creatinine, Ser 1.00 0.61 - 1.24 mg/dL   Glucose, Bld 112 (H) 70 - 99 mg/dL   Calcium, Ion 1.19 1.15 - 1.40 mmol/L   TCO2 28 22 - 32 mmol/L   Hemoglobin 15.0 13.0 - 17.0 g/dL   HCT 44.0 39.0 - 52.0 %   Recent Results (from the past 240 hour(s))  SARS CORONAVIRUS 2 (TAT 6-24 HRS) Nasopharyngeal Nasopharyngeal Swab     Status: None   Collection Time: 07/10/19 12:27 PM   Specimen: Nasopharyngeal Swab  Result Value Ref Range Status   SARS Coronavirus 2 NEGATIVE NEGATIVE Final    Comment: (NOTE) SARS-CoV-2 target nucleic acids are NOT DETECTED. The SARS-CoV-2 RNA is generally detectable in upper and lower respiratory specimens during the acute phase of infection. Negative results do not preclude SARS-CoV-2 infection, do not rule out co-infections with other pathogens, and should not be used as the sole basis for treatment or other patient management decisions. Negative results must be combined  with clinical observations, patient history, and epidemiological information. The expected result is Negative. Fact Sheet for Patients: SugarRoll.be Fact Sheet for Healthcare Providers: https://www.woods-mathews.com/ This test is not yet approved or cleared by the [place] FDA and  has been authorized for detection and/or diagnosis of SARS-CoV-2 by FDA under an Emergency Use Authorization (EUA). This EUA will remain  in effect (meaning this test can  be used) for the duration of the COVID-19 declaration under Section 56 4(b)(1) of the Act, 21 U.S.C. section 360bbb-3(b)(1), unless the authorization is terminated or revoked sooner. Performed at [facility], [place] [address], [place], [place] [address]    Creatinine: Recent Labs    07/14/19 1051  CREATININE 1.00    Impression/Assessment/plan:  I discussed with the patient the nature, potential benefits, risks and alternatives to thulium laser vaporization of the prostate with possible conversion to transurethral resection of the prostate, including side effects of the proposed treatment, the likelihood of the patient achieving the goals of the procedure, and any potential problems that might occur during the procedure or recuperation. All questions answered. Patient elects to proceed.   Festus Aloe 07/14/2019, 11:13 AM

## 2019-07-17 DIAGNOSIS — N401 Enlarged prostate with lower urinary tract symptoms: Secondary | ICD-10-CM | POA: Diagnosis not present

## 2019-07-17 DIAGNOSIS — R3914 Feeling of incomplete bladder emptying: Secondary | ICD-10-CM | POA: Diagnosis not present

## 2019-07-17 DIAGNOSIS — R3912 Poor urinary stream: Secondary | ICD-10-CM | POA: Diagnosis not present

## 2019-07-23 ENCOUNTER — Telehealth: Payer: Self-pay | Admitting: Internal Medicine

## 2019-07-23 DIAGNOSIS — I10 Essential (primary) hypertension: Secondary | ICD-10-CM

## 2019-07-23 NOTE — Chronic Care Management (AMB) (Signed)
°  Chronic Care Management   Note  07/23/2019 Name: Nicholas Paul MRN: [id number] DOB: [birth year]  [name] is a 69 y.o. year old male who is a primary care patient of Johns, Celesta Gentile, MD. I reached out to [name] by phone today in response to a referral sent by [name]'s PCP, Marjory Sneddon, MD.   [name] was given information about Chronic Care Management services today including:  1. CCM service includes personalized support from designated clinical staff supervised by his physician, including individualized plan of care and coordination with other care providers 2. 24/7 contact phone numbers for assistance for urgent and routine care needs. 3. Service will only be billed when office clinical staff spend 20 minutes or more in a month to coordinate care. 4. Only one practitioner may furnish and bill the service in a calendar month. 5. The patient may stop CCM services at any time (effective at the end of the month) by phone call to the office staff.   Patient agreed to services and verbal consent obtained.   Follow up plan:   [name] [employer] Scheduler

## 2019-08-12 ENCOUNTER — Telehealth: Payer: Medicare Other

## 2019-08-24 NOTE — Addendum Note (Signed)
Addended by: Aviva Signs M on: 08/24/2019 10:17 AM   Modules accepted: Orders

## 2019-08-25 NOTE — Chronic Care Management (AMB) (Signed)
Chronic Care Management Pharmacy  Name: Nicholas Paul  MRN: [id number] DOB: Oct 01, 1950   Chief Complaint/ HPI  Nicholas Paul,  69 y.o. , male presents for their Initial CCM visit with the clinical pharmacist via telephone due to COVID-19 Pandemic.  PCP : Nicholas Sneddon, MD  Their chronic conditions include: HTN, HLD, IBS, back pain, BPH  Wt is same, 178 lbs. Still flying - pilot. Plays golf - walks 6 miles at least each time.   Office Visits: 03/26/19 Dr Nicholas Paul VV: recent mechanical fall, possible concussion. Determined no need for imaging. Rest advised.  11/06/18 Dr Nicholas Paul OV: pt is stable, no med changes.  Consult Visit: 07/14/19 admission [facility] surgery: thulium laser vaporization of prostate  12/23/18 NP Nicholas Paul (cardiology): nonobstructive CAD stable, no med changes.   Medications: Outpatient Encounter Medications as of 08/26/2019  Medication Sig  . amLODipine (NORVASC) 5 MG tablet TAKE 1 TABLET BY MOUTH EVERY DAY (Patient taking differently: Take 5 mg by mouth daily. )  . aspirin EC 81 MG tablet Take 81 mg by mouth daily.  Marland Kitchen atorvastatin (LIPITOR) 10 MG tablet TAKE 1 TABLET BY MOUTH ON MONDAYS, WEDNESDAYS, AND FRIDAYS ONLY. (Patient taking differently: Take 10 mg by mouth 3 (three) times a week. TAKE 1 TABLET BY MOUTH ON MONDAYS, WEDNESDAYS, AND FRIDAYS ONLY.)  . Cholecalciferol (VITAMIN D) 125 MCG (5000 UT) CAPS Take 125 mcg by mouth daily.   . Coenzyme Q10 (CO Q 10 PO) Take 1 capsule by mouth daily.  . diphenhydramine-acetaminophen (TYLENOL PM) 25-500 MG TABS tablet Take 1 tablet by mouth at bedtime as needed.  . famotidine (PEPCID) 10 MG tablet Take 10 mg by mouth as needed for heartburn or indigestion.  . hydrochlorothiazide (MICROZIDE) 12.5 MG capsule TAKE 1 CAPSULE BY MOUTH EVERY DAY (Patient taking differently: Take 12.5 mg by mouth daily. )  . Naproxen Sod-diphenhydrAMINE (ALEVE PM) 220-25 MG TABS Take 1 tablet by mouth as needed.   . Omega-3 Fatty Acids (OMEGA 3  PO) Take 1 capsule by mouth daily.   . psyllium (METAMUCIL) 58.6 % packet Take 2 packets by mouth 2 (two) times daily.   . quinapril (ACCUPRIL) 40 MG tablet TAKE 1 TABLET BY MOUTH EVERY DAY (Patient taking differently: Take 40 mg by mouth at bedtime. TAKE 1 TABLET BY MOUTH EVERY DAY)  . cephALEXin (KEFLEX) 500 MG capsule Take 1 capsule (500 mg total) by mouth 4 (four) times daily.  . tamsulosin (FLOMAX) 0.4 MG CAPS capsule Take 1 capsule (0.4 mg total) by mouth daily after supper.   No facility-administered encounter medications on file as of 08/26/2019.     Current Diagnosis/Assessment: SDOH Interventions     Most Recent Value  SDOH Interventions  SDOH Interventions for the Following Domains  Alcohol Usage  Alcohol Brief Interventions/Follow-up  AUDIT Score <7 follow-up not indicated      Goals Addressed            This Visit's Progress   . Pharmacy Care Plan       CARE PLAN ENTRY  Current Barriers:  . Chronic Disease Management support, education, and care coordination needs related to HTN and HLD  Pharmacist Clinical Goal(s):  Marland Kitchen Over the next 180 days, patient will maintain adherence to medications and lifestyle to prevent ASCVD and other health complications  Interventions: . Comprehensive medication review performed. . Discussed BP and cholesterol goals, importance of exercise and diet to maintain current health status  Patient Self Care Activities:  . Self administers  medications as prescribed, Calls pharmacy for medication refills, and Calls provider office for new concerns or questions  Initial goal documentation       Hypertension   BP today is: <130/80  Office blood pressures are  BP Readings from Last 3 Encounters:  07/14/19 137/75  12/23/18 130/70  11/06/18 132/80   Patient has failed these meds in the past: n/a Patient is currently controlled on the following medications: amlodipine 5 mg daily, HCTZ 12.5 mg daily, quinapril 40 mg HS    Patient  checks BP at home 3-5x per week  Patient home BP readings are ranging: 115/72 - 138/84  We discussed diet and exercise extensively; pt golfs nearly every weekend and walks 6-7 miles. Discussed BP goals, consequences of uncontrolled BP, importance of adherence to medications to maintain BP.  Plan  Continue current medications and control with diet and exercise     Hyperlipidemia   Lipid Panel     Component Value Date/Time   CHOL 159 11/06/2018 1202   CHOL 155 12/24/2017 1007   TRIG 78.0 11/06/2018 1202   TRIG 78 02/28/2006 0938   HDL 56.40 11/06/2018 1202   HDL 62 12/24/2017 1007   CHOLHDL 3 11/06/2018 1202   VLDL 15.6 11/06/2018 1202   LDLCALC 87 11/06/2018 1202   LDLCALC 79 12/24/2017 1007   LABVLDL 14 12/24/2017 1007     The ASCVD Risk score (Goff DC Jr., et al., 2013) failed to calculate for the following reasons:   The valid systolic blood pressure range is 90 to 200 mmHg   Patient has failed these meds in past: rosuvastatin 5mg  (myalgias) Patient is currently controlled on the following medications: atorvastatin 10 mg MWF, aspirin 81 mg, CoQ10, fish oil OTC   We discussed:  diet and exercise extensively, cholesterol goals, benefits of statin to reduce ASCVD risk. Pt does occasionally have myalgias in leg the day after taking atorvastatin, but it goes away in a few hours. Denies issues with aspirin, fish oil or CoQ10.  Plan  Continue current medications and control with diet and exercise   BPH   Patient has failed these meds in past: finasteride, tamusulosin Patient is currently controlled on the following medications: no meds  We discussed: Pt had prostate surgery on 07/14/19, pt is extremely satisfied with results. He has been off tamsulosin for 2 weeks and reports no issues with urgency, frequency or nocturia. "it's like i'm 58-54 years old again"  Plan  Continue to monitor for BPH symptoms   GERD   Patient has failed these meds in past: n/a Patient is  currently controlled on the following medications: famotidine 10 mg prn  We discussed:  Pt did not reports issues with reflux.  Plan  Continue current medications    Health Maintenance   Patient is currently controlled on the following medications: Metamucil, Vitamin D 5000 IU, Aleve PM prn   We discussed: pt occasionally takes Aleve PM at bedtime for soreness, aches/pains. Discussed safety issues with long term use of NSAIDs including kidney, bleeding and BP issues. Pt would like to try Tylenol PM instead as a safer alternative.  Plan  Recommended to switch Aleve to Tylenol for prn use   Medication Management   Pt uses [facility] for all medications Does not use pill box Pt endorses 100% compliance  We discussed: pt is very happy with [facility], medications are delivered to his house at no extra charge and all meds are covered by insurance. Pt is not interested in  packaging at this time, wishes to continue using [facility] for medication needs.  Plan  Continue current medication management strategy     Follow up: 6 month phone visit  Charlene Brooke, PharmD Clinical Pharmacist [facility] at [facility] [phone]

## 2019-08-26 ENCOUNTER — Other Ambulatory Visit: Payer: Self-pay

## 2019-08-26 ENCOUNTER — Ambulatory Visit: Payer: Medicare Other | Admitting: Pharmacist

## 2019-08-26 DIAGNOSIS — I10 Essential (primary) hypertension: Secondary | ICD-10-CM

## 2019-08-26 DIAGNOSIS — E559 Vitamin D deficiency, unspecified: Secondary | ICD-10-CM

## 2019-08-26 DIAGNOSIS — E7849 Other hyperlipidemia: Secondary | ICD-10-CM

## 2019-08-26 NOTE — Patient Instructions (Signed)
Visit Information  Thank you for meeting with me to discuss your medications! I look forward to working with you to achieve your health care goals. Below is a summary of what we talked about during the visit:  Goals Addressed            This Visit's Progress   . Pharmacy Care Plan       CARE PLAN ENTRY  Current Barriers:  . Chronic Disease Management support, education, and care coordination needs related to HTN and HLD  Pharmacist Clinical Goal(s):  Marland Kitchen Over the next 180 days, patient will maintain adherence to medications and lifestyle to prevent ASCVD and other health complications  Interventions: . Comprehensive medication review performed. . Discussed BP and cholesterol goals, importance of exercise and diet to maintain current health status  Patient Self Care Activities:  . Self administers medications as prescribed, Calls pharmacy for medication refills, and Calls provider office for new concerns or questions  Initial goal documentation       Nicholas Paul was given information about Chronic Care Management services today including:  1. CCM service includes personalized support from designated clinical staff supervised by his physician, including individualized plan of care and coordination with other care providers 2. 24/7 contact phone numbers for assistance for urgent and routine care needs. 3. Standard insurance, coinsurance, copays and deductibles apply for chronic care management only during months in which we provide at least 20 minutes of these services. Most insurances cover these services at 100%, however patients may be responsible for any copay, coinsurance and/or deductible if applicable. This service may help you avoid the need for more expensive face-to-face services. 4. Only one practitioner may furnish and bill the service in a calendar month. 5. The patient may stop CCM services at any time (effective at the end of the month) by phone call to the office  staff.  Patient agreed to services and verbal consent obtained.   The patient verbalized understanding of instructions provided today and declined a print copy of patient instruction materials.  Telephone follow up appointment with pharmacy team member scheduled for: 6 months  Charlene Brooke, PharmD Clinical Pharmacist [facility] at [facility] [phone]    Heart-Healthy Eating Plan Many factors influence your heart (coronary) health, including eating and exercise habits. Coronary risk increases with abnormal blood fat (lipid) levels. Heart-healthy meal planning includes limiting unhealthy fats, increasing healthy fats, and making other diet and lifestyle changes. What are tips for following this plan? Cooking Cook foods using methods other than frying. Baking, boiling, grilling, and broiling are all good options. Other ways to reduce fat include:  Removing the skin from poultry.  Removing all visible fats from meats.  Steaming vegetables in water or broth. Meal planning   At meals, imagine dividing your plate into fourths: ? Fill one-half of your plate with vegetables and green salads. ? Fill one-fourth of your plate with whole grains. ? Fill one-fourth of your plate with lean protein foods.  Eat 4-5 servings of vegetables per day. One serving equals 1 cup raw or cooked vegetable, or 2 cups raw leafy greens.  Eat 4-5 servings of fruit per day. One serving equals 1 medium whole fruit,  cup dried fruit,  cup fresh, frozen, or canned fruit, or  cup 100% fruit juice.  Eat more foods that contain soluble fiber. Examples include apples, broccoli, carrots, beans, peas, and barley. Aim to get 25-30 g of fiber per day.  Increase your consumption of legumes,  nuts, and seeds to 4-5 servings per week. One serving of dried beans or legumes equals  cup cooked, 1 serving of nuts is  cup, and 1 serving of seeds equals 1 tablespoon. Fats  Choose healthy fats more  often. Choose monounsaturated and polyunsaturated fats, such as olive and canola oils, flaxseeds, walnuts, almonds, and seeds.  Eat more omega-3 fats. Choose salmon, mackerel, sardines, tuna, flaxseed oil, and ground flaxseeds. Aim to eat fish at least 2 times each week.  Check food labels carefully to identify foods with trans fats or high amounts of saturated fat.  Limit saturated fats. These are found in animal products, such as meats, butter, and cream. Plant sources of saturated fats include palm oil, palm kernel oil, and coconut oil.  Avoid foods with partially hydrogenated oils in them. These contain trans fats. Examples are stick margarine, some tub margarines, cookies, crackers, and other baked goods.  Avoid fried foods. General information  Eat more home-cooked food and less restaurant, buffet, and fast food.  Limit or avoid alcohol.  Limit foods that are high in starch and sugar.  Lose weight if you are overweight. Losing just 5-10% of your body weight can help your overall health and prevent diseases such as diabetes and heart disease.  Monitor your salt (sodium) intake, especially if you have high blood pressure. Talk with your health care provider about your sodium intake.  Try to incorporate more vegetarian meals weekly. What foods can I eat? Fruits All fresh, canned (in natural juice), or frozen fruits. Vegetables Fresh or frozen vegetables (raw, steamed, roasted, or grilled). Green salads. Grains Most grains. Choose whole wheat and whole grains most of the time. Rice and pasta, including brown rice and pastas made with whole wheat. Meats and other proteins Lean, well-trimmed beef, veal, pork, and lamb. Chicken and [place] without skin. All fish and shellfish. Wild duck, rabbit, pheasant, and venison. Egg whites or low-cholesterol egg substitutes. Dried beans, peas, lentils, and tofu. Seeds and most nuts. Dairy Low-fat or nonfat cheeses, including ricotta and  mozzarella. Skim or 1% milk (liquid, powdered, or evaporated). Buttermilk made with low-fat milk. Nonfat or low-fat yogurt. Fats and oils Non-hydrogenated (trans-free) margarines. Vegetable oils, including soybean, sesame, sunflower, olive, peanut, safflower, corn, canola, and cottonseed. Salad dressings or mayonnaise made with a vegetable oil. Beverages Water (mineral or sparkling). Coffee and tea. Diet carbonated beverages. Sweets and desserts Sherbet, gelatin, and fruit ice. Small amounts of dark chocolate. Limit all sweets and desserts. Seasonings and condiments All seasonings and condiments. The items listed above may not be a complete list of foods and beverages you can eat. Contact a dietitian for more options. What foods are not recommended? Fruits Canned fruit in heavy syrup. Fruit in cream or butter sauce. Fried fruit. Limit coconut. Vegetables Vegetables cooked in cheese, cream, or butter sauce. Fried vegetables. Grains Breads made with saturated or trans fats, oils, or whole milk. Croissants. Sweet rolls. Donuts. High-fat crackers, such as cheese crackers. Meats and other proteins Fatty meats, such as hot dogs, ribs, sausage, bacon, rib-eye roast or steak. High-fat deli meats, such as salami and bologna. Caviar. Domestic duck and goose. Organ meats, such as liver. Dairy Cream, sour cream, cream cheese, and creamed cottage cheese. Whole milk cheeses. Whole or 2% milk (liquid, evaporated, or condensed). Whole buttermilk. Cream sauce or high-fat cheese sauce. Whole-milk yogurt. Fats and oils Meat fat, or shortening. Cocoa butter, hydrogenated oils, palm oil, coconut oil, palm kernel oil. Solid fats and shortenings, including bacon  fat, salt pork, lard, and butter. Nondairy cream substitutes. Salad dressings with cheese or sour cream. Beverages Regular sodas and any drinks with added sugar. Sweets and desserts Frosting. Pudding. Cookies. Cakes. Pies. Milk chocolate or white  chocolate. Buttered syrups. Full-fat ice cream or ice cream drinks. The items listed above may not be a complete list of foods and beverages to avoid. Contact a dietitian for more information. Summary  Heart-healthy meal planning includes limiting unhealthy fats, increasing healthy fats, and making other diet and lifestyle changes.  Lose weight if you are overweight. Losing just 5-10% of your body weight can help your overall health and prevent diseases such as diabetes and heart disease.  Focus on eating a balance of foods, including fruits and vegetables, low-fat or nonfat dairy, lean protein, nuts and legumes, whole grains, and heart-healthy oils and fats. This information is not intended to replace advice given to you by your health care provider. Make sure you discuss any questions you have with your health care provider. Document Revised: 06/07/2017 Document Reviewed: 06/07/2017 Elsevier Patient Education  2020 [employer].

## 2019-08-27 DIAGNOSIS — R3914 Feeling of incomplete bladder emptying: Secondary | ICD-10-CM | POA: Diagnosis not present

## 2019-09-07 ENCOUNTER — Telehealth: Payer: Medicare Other | Admitting: Family

## 2019-09-07 DIAGNOSIS — M545 Low back pain, unspecified: Secondary | ICD-10-CM

## 2019-09-07 MED ORDER — BACLOFEN 10 MG PO TABS: 10.0000 mg | ORAL_TABLET | Freq: Three times a day (TID) | ORAL | 0 refills | Status: DC

## 2019-09-07 MED ORDER — NAPROXEN 500 MG PO TABS: 500.0000 mg | ORAL_TABLET | Freq: Two times a day (BID) | ORAL | 0 refills | Status: DC

## 2019-09-07 NOTE — Progress Notes (Signed)
We are sorry that you are not feeling well.  Here is how we plan to help!  Based on what you have shared with me it looks like you mostly have acute back pain.  Acute back pain is defined as musculoskeletal pain that can resolve in 1-3 weeks with conservative treatment.  I have prescribed Naprosyn 500 mg take one by mouth twice a day non-steroid anti-inflammatory (NSAID) as well as Baclofen 10 mg every eight hours as needed which is a muscle relaxer  Some patients experience stomach irritation or in increased heartburn with anti-inflammatory drugs.  Please keep in mind that muscle relaxer's can cause fatigue and should not be taken while at work or driving.  Back pain is very common.  The pain often gets better over time.  The cause of back pain is usually not dangerous.  Most people can learn to manage their back pain on their own.  If your pain does not improve or worsens or you develop any urinary frequency, burning when you pee you need to be seen face to face.   Home Care  Stay active.  Start with short walks on flat ground if you can.  Try to walk farther each day.  Do not sit, drive or stand in one place for more than 30 minutes.  Do not stay in bed.  Do not avoid exercise or work.  Activity can help your back heal faster.  Be careful when you bend or lift an object.  Bend at your knees, keep the object close to you, and do not twist.  Sleep on a firm mattress.  Lie on your side, and bend your knees.  If you lie on your back, put a pillow under your knees.  Only take medicines as told by your doctor.  Put ice on the injured area.  Put ice in a plastic bag  Place a towel between your skin and the bag  Leave the ice on for 15-20 minutes, 3-4 times a day for the first 2-3 days. 210 After that, you can switch between ice and heat packs.  Ask your doctor about back exercises or massage.  Avoid feeling anxious or stressed.  Find good ways to deal with stress, such as  exercise.  Get Help Right Way If:  Your pain does not go away with rest or medicine.  Your pain does not go away in 1 week.  You have new problems.  You do not feel well.  The pain spreads into your legs.  You cannot control when you poop (bowel movement) or pee (urinate)  You feel sick to your stomach (nauseous) or throw up (vomit)  You have belly (abdominal) pain.  You feel like you may pass out (faint).  If you develop a fever.  Make Sure you:  Understand these instructions.  Will watch your condition  Will get help right away if you are not doing well or get worse.  Your e-visit answers were reviewed by a board certified advanced clinical practitioner to complete your personal care plan.  Depending on the condition, your plan could have included both over the counter or prescription medications.  If there is a problem please reply  once you have received a response from your provider.  Your safety is important to [place].  If you have drug allergies check your prescription carefully.    You can use MyChart to ask questions about today's visit, request a non-urgent call back, or ask for a work or school  excuse for 24 hours related to this e-Visit. If it has been greater than 24 hours you will need to follow up with your provider, or enter a new e-Visit to address those concerns.  You will get an e-mail in the next two days asking about your experience.  I hope that your e-visit has been valuable and will speed your recovery. Thank you for using e-visits.  Approximately 5 minutes was spent documenting and reviewing patient's chart.

## 2019-09-17 DIAGNOSIS — M25551 Pain in right hip: Secondary | ICD-10-CM | POA: Diagnosis not present

## 2019-09-17 DIAGNOSIS — M545 Low back pain: Secondary | ICD-10-CM | POA: Diagnosis not present

## 2019-09-21 DIAGNOSIS — M6281 Muscle weakness (generalized): Secondary | ICD-10-CM | POA: Diagnosis not present

## 2019-09-21 DIAGNOSIS — S39011D Strain of muscle, fascia and tendon of abdomen, subsequent encounter: Secondary | ICD-10-CM | POA: Diagnosis not present

## 2019-09-24 ENCOUNTER — Ambulatory Visit: Payer: Medicare Other | Admitting: Gastroenterology

## 2019-10-06 IMAGING — DX RIGHT RIBS - 2 VIEW
2 series · 2 of 2 positions shown · non-contrast
Comparison: 05/02/2011, CT 02/17/2018

CLINICAL DATA: 67-year-old male with rib pain

EXAM:
RIGHT RIBS - 2 VIEW

[rib ap]
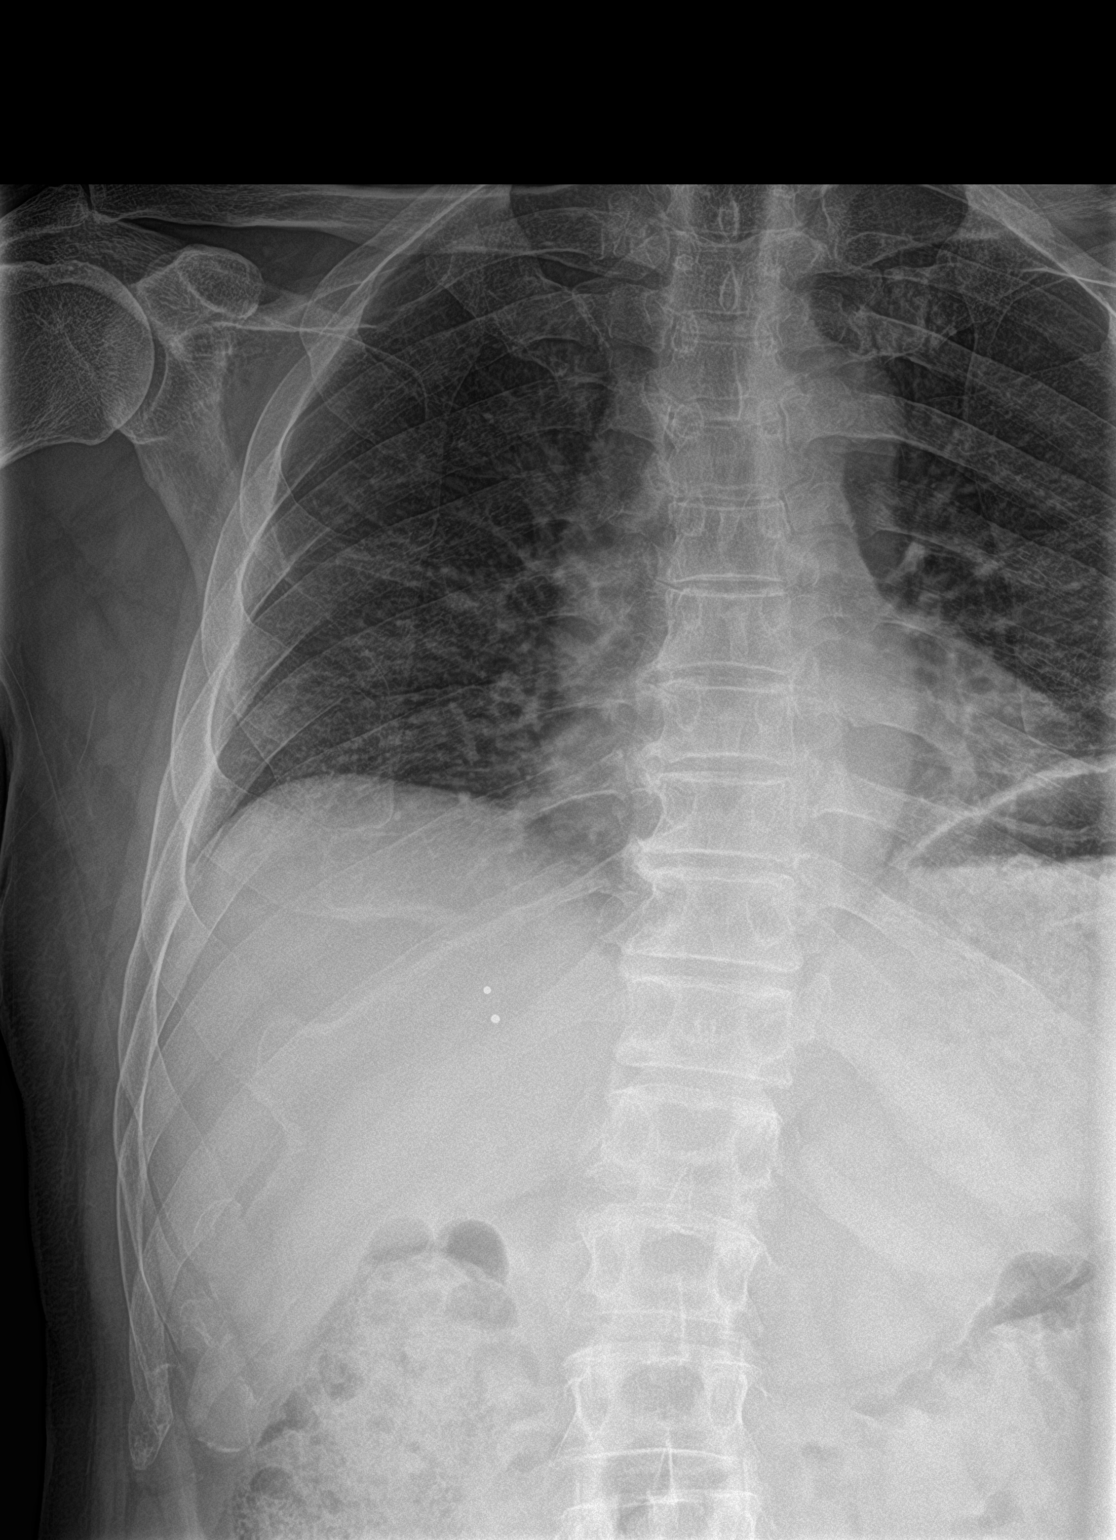

[rib obl]
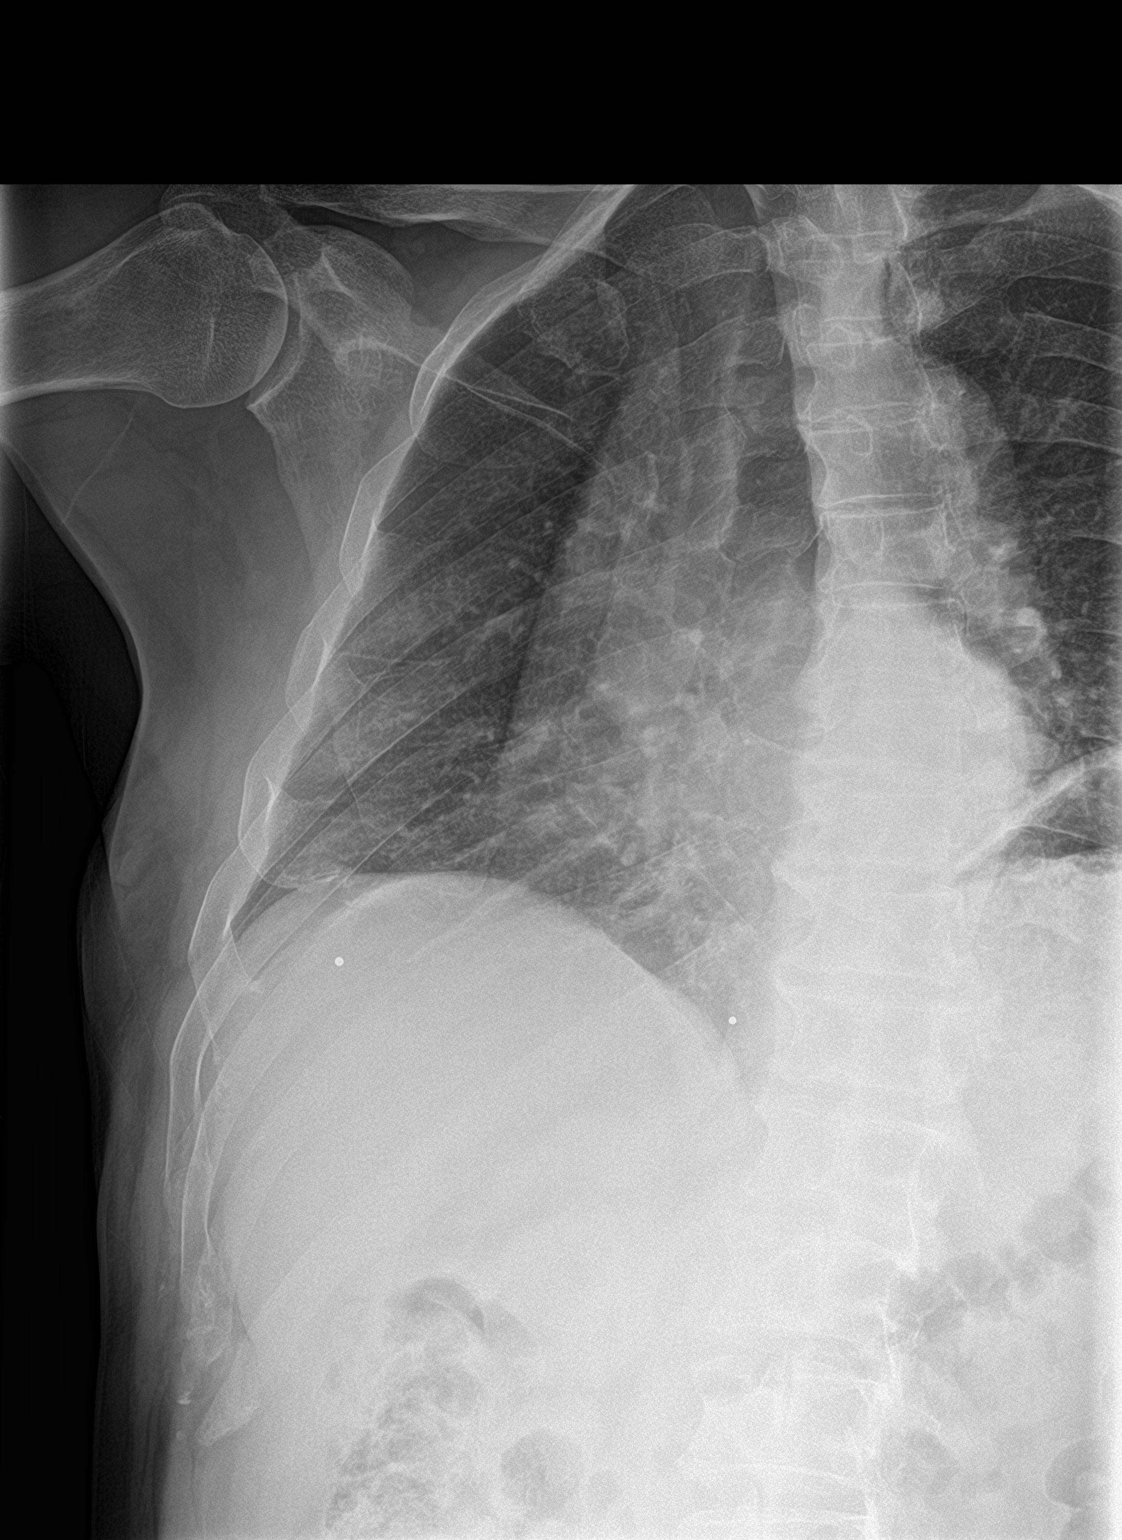

[2 of 2 positions shown; findings below may reference images not displayed]

FINDINGS: Cardiomediastinal silhouette unchanged in size and contour.
Visualized aspects of the lungs unremarkable.

No acute displaced rib fracture. Fiducial markers on the lower right
chest, anterior and posterior. Chondral calcifications evident.
IMPRESSION: No acute displaced rib fracture identified.

## 2019-10-07 ENCOUNTER — Telehealth: Payer: Self-pay | Admitting: Internal Medicine

## 2019-10-07 NOTE — Telephone Encounter (Signed)
Recv'd records from [facility] forwarded 2 pages to Dr. Billey Gosling 5/26/21fbg

## 2019-11-28 ENCOUNTER — Other Ambulatory Visit: Payer: Self-pay | Admitting: Nurse Practitioner

## 2019-12-13 ENCOUNTER — Other Ambulatory Visit: Payer: Self-pay | Admitting: Nurse Practitioner

## 2019-12-21 NOTE — Progress Notes (Signed)
CARDIOLOGY OFFICE NOTE  Date:  12/30/2019    [name] Date of Birth: 11-Apr-1951 Medical Record #[id number]  PCP:  Marjory Sneddon, MD  Cardiologist:  Servando Snare     Chief Complaint  Patient presents with  . Follow-up    1 year check.     History of Present Illness: Nicholas Paul is a 69 y.o. male who presents today for a one year check. Former patient of Dr. Sherryl Barters. He is following with me.   He has had prior coronary CT showing soft plaque in the RCA in 2012 and a calcium score of 0. Has HTN and HLD.Strong FH for bladder cancer.Has had a past lung nodule noted(4 mm) as well by CT in 2012 - but with no risk factors, no further follow up felt to be needed per radiology and after discussion with Dr. Mare Ferrari.   When seen in February of 2020 - was doing well but had gotten lax with exercise and had gained weight and using a little more alcohol.We got his coronary CT updated - non obstructive disease noted. To continue with medical management. Last seen in August and he was doing well.   Comes in today. Here alone. He is doing well. Is finishing up a physical boot camp he has done with his wife. He has enjoyed this. He is still golfing. Has had issues with BPH - had a GU procedure - "steamed away the tissue" by Dr. Junious Silk. Off his medicines now and he is much happier with this. No chest pain. Breathing is good. Not dizzy. BP is great at home. He is overdue for his physical with PCP. He is fasting today.   His wife is Nicholas Paul and she sees Dr. Stanford Breed.   Past Medical History:  Diagnosis Date  . BPH (benign prostatic hyperplasia)    urologist--- dr Junious Silk  . Coronary artery disease cardiologist--- Cecille Rubin Tuvia Woodrick NP   last CTA 02-17-2018  nonobstructive proximal and mid LAD, calcium score 15;   nuclear stress test 06-09-2003 in epic,  no ischemia w/ ef 59%  . GERD (gastroesophageal reflux disease)   . H/O: rheumatic fever    noted after tonsillectomy    . Hyperlipidemia   . Hypertension    followed by cardiology  . IBS (irritable bowel syndrome)    Dr Carlean Purl  . Internal hemorrhoids   . Pulmonary nodule    4 mm RML nodule on CT scan in 12/12. Needs follow up in one year.       Past Surgical History:  Procedure Laterality Date  . COLONOSCOPY W/ POLYPECTOMY  2006   internal hemorrhoids 2011  . THULIUM LASER TURP (TRANSURETHRAL RESECTION OF PROSTATE) N/A 07/14/2019   Procedure: THULIUM LASER TURP (TRANSURETHRAL RESECTION OF PROSTATE);  Surgeon: Festus Aloe, MD;  Location: [facility];  Service: Urology;  Laterality: N/A;  . TONSILLECTOMY AND ADENOIDECTOMY       Medications: Current Meds  Medication Sig  . amLODipine (NORVASC) 5 MG tablet TAKE 1 TABLET BY MOUTH EVERY DAY (Patient taking differently: Take 5 mg by mouth daily. )  . aspirin EC 81 MG tablet Take 81 mg by mouth daily.  Marland Kitchen atorvastatin (LIPITOR) 10 MG tablet TAKE 1 TABLET BY MOUTH ON MONDAYS, WEDNESDAYS, AND FRIDAYS ONLY. (Patient taking differently: Take 10 mg by mouth 3 (three) times a week. TAKE 1 TABLET BY MOUTH ON MONDAYS, WEDNESDAYS, AND FRIDAYS ONLY.)  . Cholecalciferol (VITAMIN D) 125 MCG (5000 UT) CAPS Take 125  mcg by mouth daily.   . Coenzyme Q10 (CO Q 10 PO) Take 1 capsule by mouth daily.  . diphenhydramine-acetaminophen (TYLENOL PM) 25-500 MG TABS tablet Take 1 tablet by mouth at bedtime as needed.  . famotidine (PEPCID) 10 MG tablet Take 10 mg by mouth as needed for heartburn or indigestion.  . hydrochlorothiazide (MICROZIDE) 12.5 MG capsule TAKE 1 CAPSULE BY MOUTH EVERY DAY  . Naproxen Sod-diphenhydrAMINE (ALEVE PM) 220-25 MG TABS Take 1 tablet by mouth as needed.   . Omega-3 Fatty Acids (OMEGA 3 PO) Take 1 capsule by mouth daily.   . psyllium (METAMUCIL) 58.6 % packet Take 2 packets by mouth 2 (two) times daily.   . quinapril (ACCUPRIL) 40 MG tablet TAKE 1 TABLET BY MOUTH EVERY DAY     Allergies: Allergies  Allergen Reactions  .  Doxazosin Mesylate Swelling    REACTION: edema on the generic (07-09-2019  Per pt this reaction was approx. 1990s).   . Crestor [Rosuvastatin]     Crestor 5 mg qd caused leg myalgias ; no issue with Lipitor 10 mg M, W, & F    Social History: The patient  reports that he has never smoked. He has never used smokeless tobacco. He reports current alcohol use. He reports that he does not use drugs.   Family History: The patient's family history includes Cancer in his brother, father, and paternal aunt; Hyperlipidemia in his mother; Hypertension in his brother and mother.   Review of Systems: Please see the history of present illness.   All other systems are reviewed and negative.   Physical Exam: VS:  BP 110/70   Pulse 63   Ht 5' 9.5" (1.765 m)   Wt 177 lb (80.3 kg)   SpO2 99%   BMI 25.76 kg/m  .  BMI Body mass index is 25.76 kg/m.  Wt Readings from Last 3 Encounters:  12/30/19 177 lb (80.3 kg)  07/14/19 180 lb 12.8 oz (82 kg)  12/23/18 179 lb 12.8 oz (81.6 kg)    General: Pleasant. Alert and in no acute distress. Weight is down a few pounds.   Neck: Supple, no JVD, carotid bruits, or masses noted.  Cardiac: Regular rate and rhythm. No murmurs, rubs, or gallops. No edema.  Respiratory:  Lungs are clear to auscultation bilaterally with normal work of breathing.  GI: Soft and nontender.  MS: No deformity or atrophy. Gait and ROM intact.  Skin: Warm and dry. Color is normal.  Neuro:  Strength and sensation are intact and no gross focal deficits noted.  Psych: Alert, appropriate and with normal affect.   LABORATORY DATA:  EKG:  EKG is ordered today.  Personally reviewed by me. This demonstrates sinus rhythm/arrhythmia. HR is 63.   Lab Results  Component Value Date   WBC 6.3 11/06/2018   HGB 15.0 07/14/2019   HCT 44.0 07/14/2019   PLT 201.0 11/06/2018   GLUCOSE 112 (H) 07/14/2019   CHOL 159 11/06/2018   TRIG 78.0 11/06/2018   HDL 56.40 11/06/2018   LDLCALC 87 11/06/2018     ALT 16 11/06/2018   AST 13 11/06/2018   NA 139 07/14/2019   K 4.0 07/14/2019   CL 101 07/14/2019   CREATININE 1.00 07/14/2019   BUN 13 07/14/2019   CO2 30 11/06/2018   TSH 0.69 11/06/2018   PSA 3.04 11/06/2018   HGBA1C 5.6 11/06/2018     BNP (last 3 results) No results for input(s): BNP in the last 8760 hours.  ProBNP (  last 3 results) No results for input(s): PROBNP in the last 8760 hours.   Other Studies Reviewed Today:  CORONARY CT IMPRESSION 02/2018: 1. Coronary artery calcium score 15 Agatston units. This places the patient in the 25th percentile for age and gender, suggesting low risk for future cardiac events.  2. Nonobstructive plaque in the proximal and mid LAD.  Dalton Mclean   Electronically Signed By: Loralie Champagne M.D. On: 02/17/2018 18:36   Echo Study Conclusions from 2013  - Left ventricle: The cavity size was normal. Wall thickness was increased in a pattern of mild LVH. Systolic function was normal. The estimated ejection fraction was in the range of 55% to 60%. Wall motion was normal; there were no regional wall motion abnormalities. Doppler parameters are consistent with abnormal left ventricular relaxation (grade 1 diastolic dysfunction). - Atrial septum: No defect or patent foramen ovale was identified.    Assessment/Plan:  1. Non obstructive CAD - has had prior coronary CT noted - he is managed medically and with CV risk factor modification. He is doing well clinically.   2. HTN - BP is 120/70 by my check. No changes made today - will continue with his Norvasc 5 mg, HCTZ 12.5 mg, Accupril 40 mg.   3. HLD - on statin just 3 times a week due to tolerability issues - will check lab today.   4. BPH - this seems resolved.   Current medicines are reviewed with the patient today.  The patient does not have concerns regarding medicines other than what has been noted above.  The following changes have been  made:  See above.  Labs/ tests ordered today include:    Orders Placed This Encounter  Procedures  . Basic metabolic panel  . CBC  . Hepatic function panel  . Lipid panel  . EKG 12-Lead     Disposition:   FU with [place] in one year. He would like to establish with Dr. Stanford Breed since that is who sees his wife.     Patient is agreeable to this plan and will call if any problems develop in the interim.   SignedTruitt Merle, NP  12/30/2019 9:38 AM  [facility] [address] [employer] [place], [place]  [address] Phone: [phone] Fax: [phone]

## 2019-12-30 ENCOUNTER — Other Ambulatory Visit: Payer: Self-pay

## 2019-12-30 ENCOUNTER — Ambulatory Visit (INDEPENDENT_AMBULATORY_CARE_PROVIDER_SITE_OTHER): Payer: Medicare Other | Admitting: Nurse Practitioner

## 2019-12-30 ENCOUNTER — Encounter: Payer: Self-pay | Admitting: Nurse Practitioner

## 2019-12-30 VITALS — BP 110/70 | HR 63 | Ht 69.5 in | Wt 177.0 lb

## 2019-12-30 DIAGNOSIS — E7849 Other hyperlipidemia: Secondary | ICD-10-CM | POA: Diagnosis not present

## 2019-12-30 DIAGNOSIS — I2510 Atherosclerotic heart disease of native coronary artery without angina pectoris: Secondary | ICD-10-CM | POA: Diagnosis not present

## 2019-12-30 DIAGNOSIS — I10 Essential (primary) hypertension: Secondary | ICD-10-CM | POA: Diagnosis not present

## 2019-12-30 DIAGNOSIS — I2583 Coronary atherosclerosis due to lipid rich plaque: Secondary | ICD-10-CM | POA: Diagnosis not present

## 2019-12-30 DIAGNOSIS — N401 Enlarged prostate with lower urinary tract symptoms: Secondary | ICD-10-CM | POA: Diagnosis not present

## 2019-12-30 DIAGNOSIS — R3914 Feeling of incomplete bladder emptying: Secondary | ICD-10-CM | POA: Diagnosis not present

## 2019-12-30 LAB — BASIC METABOLIC PANEL
BUN/Creatinine Ratio: 10 (ref 10–24)
BUN: 10 mg/dL (ref 8–27)
CO2: 27 mmol/L (ref 20–29)
Calcium: 9.5 mg/dL (ref 8.6–10.2)
Chloride: 99 mmol/L (ref 96–106)
Creatinine, Ser: 1.02 mg/dL (ref 0.76–1.27)
GFR calc Af Amer: 86 mL/min/{1.73_m2} (ref >59)
GFR calc non Af Amer: 74 mL/min/{1.73_m2} (ref >59)
Glucose: 104 mg/dL — ABNORMAL HIGH (ref 65–99)
Potassium: 4.6 mmol/L (ref 3.5–5.2)
Sodium: 137 mmol/L (ref 134–144)

## 2019-12-30 LAB — LIPID PANEL
Chol/HDL Ratio: 2.3 ratio (ref 0.0–5.0)
Cholesterol, Total: 166 mg/dL (ref 100–199)
HDL: 71 mg/dL (ref >39)
LDL Chol Calc (NIH): 83 mg/dL (ref 0–99)
Triglycerides: 63 mg/dL (ref 0–149)
VLDL Cholesterol Cal: 12 mg/dL (ref 5–40)

## 2019-12-30 LAB — CBC
Hematocrit: 44.6 % (ref 37.5–51.0)
Hemoglobin: 15.8 g/dL (ref 13.0–17.7)
MCH: 31.7 pg (ref 26.6–33.0)
MCHC: 35.4 g/dL (ref 31.5–35.7)
MCV: 90 fL (ref 79–97)
Platelets: 226 10*3/uL (ref 150–450)
RBC: 4.98 x10E6/uL (ref 4.14–5.80)
RDW: 12.1 % (ref 11.6–15.4)
WBC: 6 10*3/uL (ref 3.4–10.8)

## 2019-12-30 LAB — HEPATIC FUNCTION PANEL
ALT: 16 IU/L (ref 0–44)
AST: 16 IU/L (ref 0–40)
Albumin: 4.5 g/dL (ref 3.8–4.8)
Alkaline Phosphatase: 86 IU/L (ref 48–121)
Bilirubin Total: 1.3 mg/dL — ABNORMAL HIGH (ref 0.0–1.2)
Bilirubin, Direct: 0.3 mg/dL (ref 0.00–0.40)
Total Protein: 7 g/dL (ref 6.0–8.5)

## 2019-12-30 NOTE — Patient Instructions (Addendum)
After Visit Summary:  We will be checking the following labs today - BMET, CBC, HPF, Lipids   Medication Instructions:    Continue with your current medicines.    If you need a refill on your cardiac medications before your next appointment, please call your pharmacy.     Testing/Procedures To Be Arranged:  N/A  Follow-Up:   We will get you a new patient visit with Dr. Stanford Breed for one year.     At [facility], you and your health needs are our priority.  As part of our continuing mission to provide you with exceptional heart care, we have created designated Provider Care Teams.  These Care Teams include your primary Cardiologist (physician) and Advanced Practice Providers (APPs -  Physician Assistants and Nurse Practitioners) who all work together to provide you with the care you need, when you need it.  Special Instructions:  . Stay safe, wash your hands for at least 20 seconds and wear a mask when needed.  . It was good to talk with you today.    Call the [place] office at [phone] if you have any questions, problems or concerns.

## 2019-12-31 ENCOUNTER — Other Ambulatory Visit: Payer: Self-pay | Admitting: *Deleted

## 2019-12-31 MED ORDER — ATORVASTATIN CALCIUM 10 MG PO TABS: ORAL_TABLET | ORAL | 3 refills | Status: DC

## 2020-02-17 ENCOUNTER — Other Ambulatory Visit: Payer: Self-pay | Admitting: Nurse Practitioner

## 2020-02-22 ENCOUNTER — Telehealth: Payer: Medicare Other

## 2020-02-22 NOTE — Chronic Care Management (AMB) (Deleted)
Chronic Care Management Pharmacy  Name: Nicholas Paul  MRN: [id number] DOB: Dec 23, 1950   Chief Complaint/ HPI  Nicholas Paul,  69 y.o. , male presents for their Follow-Up CCM visit with the clinical pharmacist via telephone due to COVID-19 Pandemic.  PCP : Marjory Sneddon, MD  Their chronic conditions include: HTN, HLD, IBS, back pain, BPH   Pt is a pilot and still flies. He enjoys golf and walks 6 miles each time he plays.    Office Visits: 03/26/19 Dr Quay Burow VV: recent mechanical fall, possible concussion. Determined no need for imaging. Rest advised.  11/06/18 Dr Quay Burow OV: pt is stable, no med changes.  Consult Visit: 12/30/19 NP Truitt Merle (cardiology): f/u nonobstructive CAD, ordered labs. LDL goal < 70. Increased statin to 4 times a week.  08/27/19 Dr Junious Silk (urology): f/u for incomplete bladder emptying.  07/14/19 admission [facility] surgery: thulium laser vaporization of prostate 12/23/18 NP Servando Snare (cardiology): nonobstructive CAD stable, no med changes.    Allergies  Allergen Reactions  . Doxazosin Mesylate Swelling    REACTION: edema on the generic (07-09-2019  Per pt this reaction was approx. 1990s).   . Crestor [Rosuvastatin]     Crestor 5 mg qd caused leg myalgias ; no issue with Lipitor 10 mg M, W, & F    Medications: Outpatient Encounter Medications as of 02/22/2020  Medication Sig  . amLODipine (NORVASC) 5 MG tablet TAKE 1 TABLET BY MOUTH EVERY DAY  . aspirin EC 81 MG tablet Take 81 mg by mouth daily.  Marland Kitchen atorvastatin (LIPITOR) 10 MG tablet TAKE 1 TABLET BY MOUTH ON MONDAYS, WEDNESDAYS, AND FRIDAYS ONLY.  Marland Kitchen Cholecalciferol (VITAMIN D) 125 MCG (5000 UT) CAPS Take 125 mcg by mouth daily.   . Coenzyme Q10 (CO Q 10 PO) Take 1 capsule by mouth daily.  . diphenhydramine-acetaminophen (TYLENOL PM) 25-500 MG TABS tablet Take 1 tablet by mouth at bedtime as needed.  . famotidine (PEPCID) 10 MG tablet Take 10 mg by mouth as needed for heartburn or indigestion.    . hydrochlorothiazide (MICROZIDE) 12.5 MG capsule TAKE 1 CAPSULE BY MOUTH EVERY DAY  . Naproxen Sod-diphenhydrAMINE (ALEVE PM) 220-25 MG TABS Take 1 tablet by mouth as needed.   . Omega-3 Fatty Acids (OMEGA 3 PO) Take 1 capsule by mouth daily.   . psyllium (METAMUCIL) 58.6 % packet Take 2 packets by mouth 2 (two) times daily.   . quinapril (ACCUPRIL) 40 MG tablet TAKE 1 TABLET BY MOUTH EVERY DAY   No facility-administered encounter medications on file as of 02/22/2020.   Wt Readings from Last 3 Encounters:  12/30/19 177 lb (80.3 kg)  07/14/19 180 lb 12.8 oz (82 kg)  12/23/18 179 lb 12.8 oz (81.6 kg)    Current Diagnosis/Assessment:   Goals Addressed   None     Hypertension   BP today is: <130/80  Office blood pressures are  BP Readings from Last 3 Encounters:  12/30/19 110/70  07/14/19 137/75  12/23/18 130/70   Kidney Function Lab Results  Component Value Date/Time   CREATININE 1.02 12/30/2019 09:44 AM   CREATININE 1.00 07/14/2019 10:51 AM   GFR 80.56 11/06/2018 12:02 PM   GFRNONAA 74 12/30/2019 09:44 AM   GFRAA 86 12/30/2019 09:44 AM   K 4.6 12/30/2019 09:44 AM   K 4.0 07/14/2019 10:51 AM   Patient checks BP at home 3-5x per week Patient home BP readings are ranging: 115/72 - 138/84  Patient has failed these meds in the past:  n/a Patient is currently controlled on the following medications:   amlodipine 5 mg daily,   HCTZ 12.5 mg daily,   quinapril 40 mg HS    We discussed diet and exercise extensively; pt golfs nearly every weekend and walks 6-7 miles. Discussed BP goals, consequences of uncontrolled BP, importance of adherence to medications to maintain BP.  Plan  Continue current medications and control with diet and exercise     Hyperlipidemia   LDL goal < 70  Lipid Panel     Component Value Date/Time   CHOL 166 12/30/2019 0944   TRIG 63 12/30/2019 0944   TRIG 78 02/28/2006 0938   HDL 71 12/30/2019 0944   CHOLHDL 2.3 12/30/2019 0944    CHOLHDL 3 11/06/2018 1202   VLDL 15.6 11/06/2018 1202   LDLCALC 83 12/30/2019 0944   LABVLDL 12 12/30/2019 0944     The 10-year ASCVD risk score (Goff DC Jr., et al., 2013) is: 12.8%   Values used to calculate the score:     Age: 42 years     Sex: Male     Is Non-Hispanic African American: No     Diabetic: No     Tobacco smoker: No     Systolic Blood Pressure: 903 mmHg     Is BP treated: Yes     HDL Cholesterol: 71 mg/dL     Total Cholesterol: 166 mg/dL   Patient has failed these meds in past: rosuvastatin 5mg  (myalgias) Patient is currently controlled on the following medications:   atorvastatin 10 mg 4 times weekly (MWFSu)  aspirin 81 mg, CoQ10,   fish oil OTC   We discussed:  diet and exercise extensively, cholesterol goals, benefits of statin to reduce ASCVD risk. Pt does occasionally have myalgias in leg the day after taking atorvastatin, but it goes away in a few hours. Denies issues with aspirin, fish oil or CoQ10.  Plan  Continue current medications and control with diet and exercise   BPH   Patient has failed these meds in past: finasteride, tamusulosin Patient is currently controlled on the following medications:   no meds   We discussed: Pt had prostate surgery on 07/14/19, pt is extremely satisfied with results. He has been off tamsulosin for 2 weeks and reports no issues with urgency, frequency or nocturia. "it's like i'm 59-72 years old again"  Plan  Continue to monitor for BPH symptoms   GERD   Patient has failed these meds in past: n/a Patient is currently controlled on the following medications:   famotidine 10 mg prn  We discussed:  Pt did not reports issues with reflux.  Plan  Continue current medications    Health Maintenance   Patient is currently controlled on the following medications:   Metamucil,   Vitamin D 5000 IU,   Aleve PM prn   We discussed: pt occasionally takes Aleve PM at bedtime for soreness, aches/pains.  Discussed safety issues with long term use of NSAIDs including kidney, bleeding and BP issues. Pt would like to try Tylenol PM instead as a safer alternative.  Plan  Recommended to switch Aleve to Tylenol for prn use   Medication Management   Pt uses [facility] for all medications Does not use pill box Pt endorses 100% compliance  We discussed: pt is very happy with [facility], medications are delivered to his house at no extra charge and all meds are covered by insurance. Pt is not interested in packaging at this time, wishes to continue  using [facility] for medication needs.  Plan  Continue current medication management strategy     Follow up: *** month phone visit  Charlene Brooke, PharmD, BCACP Clinical Pharmacist [facility] [phone]

## 2020-03-01 DIAGNOSIS — Z23 Encounter for immunization: Secondary | ICD-10-CM | POA: Diagnosis not present

## 2020-04-04 NOTE — Progress Notes (Signed)
Virtual Visit via Video Note  I connected with Nicholas Paul on 04/04/20 at  9:00 AM EST by a video enabled telemedicine application and verified that I am speaking with the correct person using two identifiers.   I discussed the limitations of evaluation and management by telemedicine and the availability of in person appointments. The patient expressed understanding and agreed to proceed.  Present for the visit:  Myself, Dr Billey Gosling, [name].  The patient is currently at home and I am in the office.    No referring provider.    History of Present Illness: He is here for an acute visit for cold symptoms.  His symptoms started about one week ago.   He is experiencing PND with yellow drainage, nasal congestion, sinus pressure, mild sore throat, headache.  He has tried taking claritin, dayquil and nyquil w/o improvement.    He has had a sick contact - his wife.  She saw her doctor - took zpak and it did not help.  She needed something stronger.  She had some blood in mucus.  She had a CXR and CT.  She had PNA and bronchitis. She was hospitalized. Her covid tests were negative.    Temp yesterday 97.5,  BP 145/80,  Temp today 98, 155/90  Review of Systems  Constitutional: Negative for fever.       Appetite is normal  HENT: Positive for congestion, sinus pain and sore throat (mild). Negative for ear pain.        PND - yellow mucus  Respiratory: Negative for cough, shortness of breath and wheezing.   Neurological: Positive for headaches (mild).      Social History   Socioeconomic History  . Marital status: Married    Spouse name: Not on file  . Number of children: 2  . Years of education: Not on file  . Highest education level: Not on file  Occupational History    Employer: [employer]  Tobacco Use  . Smoking status: Never Smoker  . Smokeless tobacco: Never Used  Vaping Use  . Vaping Use: Never used  Substance and Sexual Activity  . Alcohol use: Yes     Comment: occasional  . Drug use: Never  . Sexual activity: Yes    Comment: vasectomy  Other Topics Concern  . Not on file  Social History Narrative   ** Merged History Encounter **       Social Determinants of Health   Financial Resource Strain:   . Difficulty of Paying Living Expenses: Not on file  Food Insecurity:   . Worried About Charity fundraiser in the Last Year: Not on file  . Ran Out of Food in the Last Year: Not on file  Transportation Needs:   . Lack of Transportation (Medical): Not on file  . Lack of Transportation (Non-Medical): Not on file  Physical Activity:   . Days of Exercise per Week: Not on file  . Minutes of Exercise per Session: Not on file  Stress:   . Feeling of Stress : Not on file  Social Connections:   . Frequency of Communication with Friends and Family: Not on file  . Frequency of Social Gatherings with Friends and Family: Not on file  . Attends Religious Services: Not on file  . Active Member of Clubs or Organizations: Not on file  . Attends Archivist Meetings: Not on file  . Marital Status: Not on file     Observations/Objective: Appears  well in NAD Breathing normally Skin appears warm and dry  Assessment and Plan:  See Problem List for Assessment and Plan of chronic medical problems.   Follow Up Instructions:    I discussed the assessment and treatment plan with the patient. The patient was provided an opportunity to ask questions and all were answered. The patient agreed with the plan and demonstrated an understanding of the instructions.   The patient was advised to call back or seek an in-person evaluation if the symptoms worsen or if the condition fails to improve as anticipated.    Binnie Rail, MD

## 2020-04-05 ENCOUNTER — Telehealth (INDEPENDENT_AMBULATORY_CARE_PROVIDER_SITE_OTHER): Payer: Medicare Other | Admitting: Internal Medicine

## 2020-04-05 ENCOUNTER — Encounter: Payer: Self-pay | Admitting: Internal Medicine

## 2020-04-05 DIAGNOSIS — J019 Acute sinusitis, unspecified: Secondary | ICD-10-CM | POA: Diagnosis not present

## 2020-04-05 MED ORDER — AMOXICILLIN-POT CLAVULANATE 875-125 MG PO TABS
1.0000 | ORAL_TABLET | Freq: Two times a day (BID) | ORAL | 0 refills | Status: DC
Start: 2020-04-05 — End: 2020-11-16

## 2020-04-05 NOTE — Assessment & Plan Note (Signed)
Acute Likely bacterial  Start Augmentin 875-125 mg BID x 10 day otc cold medications Rest, fluid Call if no improvement  

## 2020-04-11 DIAGNOSIS — R361 Hematospermia: Secondary | ICD-10-CM | POA: Diagnosis not present

## 2020-04-11 DIAGNOSIS — R310 Gross hematuria: Secondary | ICD-10-CM | POA: Diagnosis not present

## 2020-04-20 DIAGNOSIS — Z23 Encounter for immunization: Secondary | ICD-10-CM | POA: Diagnosis not present

## 2020-04-26 ENCOUNTER — Telehealth: Payer: Self-pay | Admitting: Pharmacist

## 2020-04-26 DIAGNOSIS — R310 Gross hematuria: Secondary | ICD-10-CM | POA: Diagnosis not present

## 2020-04-26 DIAGNOSIS — N400 Enlarged prostate without lower urinary tract symptoms: Secondary | ICD-10-CM | POA: Diagnosis not present

## 2020-04-26 DIAGNOSIS — K7689 Other specified diseases of liver: Secondary | ICD-10-CM | POA: Diagnosis not present

## 2020-04-26 DIAGNOSIS — N281 Cyst of kidney, acquired: Secondary | ICD-10-CM | POA: Diagnosis not present

## 2020-04-26 NOTE — Progress Notes (Signed)
    Chronic Care Management Pharmacy Assistant   Name: MACHI WHITTAKER  MRN: [id number] DOB: 06/01/1950  Reason for Encounter: General Adherence Call   PCP : Binnie Rail, MD  Allergies:   Allergies  Allergen Reactions  . Doxazosin Mesylate Swelling    REACTION: edema on the generic (07-09-2019  Per pt this reaction was approx. 1990s).   . Crestor [Rosuvastatin]     Crestor 5 mg qd caused leg myalgias ; no issue with Lipitor 10 mg M, W, & F    Medications: Outpatient Encounter Medications as of 04/26/2020  Medication Sig  . amLODipine (NORVASC) 5 MG tablet TAKE 1 TABLET BY MOUTH EVERY DAY  . amoxicillin-clavulanate (AUGMENTIN) 875-125 MG tablet Take 1 tablet by mouth 2 (two) times daily.  Marland Kitchen aspirin EC 81 MG tablet Take 81 mg by mouth daily.  Marland Kitchen atorvastatin (LIPITOR) 10 MG tablet TAKE 1 TABLET BY MOUTH ON MONDAYS, WEDNESDAYS, AND FRIDAYS ONLY.  Marland Kitchen Cholecalciferol (VITAMIN D) 125 MCG (5000 UT) CAPS Take 125 mcg by mouth daily.   . Coenzyme Q10 (CO Q 10 PO) Take 1 capsule by mouth daily.  . diphenhydramine-acetaminophen (TYLENOL PM) 25-500 MG TABS tablet Take 1 tablet by mouth at bedtime as needed.  . famotidine (PEPCID) 10 MG tablet Take 10 mg by mouth as needed for heartburn or indigestion.  . hydrochlorothiazide (MICROZIDE) 12.5 MG capsule TAKE 1 CAPSULE BY MOUTH EVERY DAY  . Naproxen Sod-diphenhydrAMINE (ALEVE PM) 220-25 MG TABS Take 1 tablet by mouth as needed.   . Omega-3 Fatty Acids (OMEGA 3 PO) Take 1 capsule by mouth daily.   . psyllium (METAMUCIL) 58.6 % packet Take 2 packets by mouth 2 (two) times daily.   . quinapril (ACCUPRIL) 40 MG tablet TAKE 1 TABLET BY MOUTH EVERY DAY   No facility-administered encounter medications on file as of 04/26/2020.    Current Diagnosis: Patient Active Problem List   Diagnosis Date Noted  . Acute non-recurrent sinusitis 04/05/2020  . Head injury, closed, initial encounter 03/26/2019  . Rib pain on right side 08/25/2018  . Herpes  zoster without complication 02/77/4128  . Urinary frequency 12/14/2016  . Hyperglycemia 09/03/2015  . Gilbert syndrome 07/30/2011  . Pulmonary nodule 05/21/2011  . ARTHRALGIA 07/14/2010  . LOW BACK PAIN SYNDROME 07/14/2010  . Irritable bowel syndrome 04/04/2010  . Actinic keratosis 10/20/2009  . History of colonic polyps 10/20/2009  . HEMATURIA, MICROSCOPIC, HX OF 10/20/2009  . Hyperlipidemia 07/31/2007  . Essential hypertension 07/31/2007    Goals Addressed   None     Follow-Up:  Pharmacist Review   A general adherence call was made to Mr. Hanken for a wellness call to see how he has been doing since his last visit with the clinical pharmacist Mendel Ryder. The patient states that he has being doing well and is not having any problems. The patient states that he does exercise regularly mainly plating golf which he walks about six miles. Over all the patient states that there has been no new changes in his health. I let the patient know that I would pass along the information to the clinical pharmacist Mendel Ryder.  Wendy Poet, Clinical Pharmacist Assistant [facility]

## 2020-05-14 HISTORY — PX: SHOULDER ARTHROSCOPY WITH OPEN ROTATOR CUFF REPAIR: SHX6092

## 2020-05-19 DIAGNOSIS — R310 Gross hematuria: Secondary | ICD-10-CM | POA: Diagnosis not present

## 2020-05-19 DIAGNOSIS — N400 Enlarged prostate without lower urinary tract symptoms: Secondary | ICD-10-CM | POA: Diagnosis not present

## 2020-05-26 ENCOUNTER — Telehealth: Payer: Self-pay | Admitting: Internal Medicine

## 2020-05-26 NOTE — Telephone Encounter (Signed)
LVM  For pt o rtn my call to schedule awv with nha. Please schedule awv if pt calls the office.

## 2020-06-02 DIAGNOSIS — Z1152 Encounter for screening for COVID-19: Secondary | ICD-10-CM | POA: Diagnosis not present

## 2020-06-06 DIAGNOSIS — S46011A Strain of muscle(s) and tendon(s) of the rotator cuff of right shoulder, initial encounter: Secondary | ICD-10-CM | POA: Diagnosis not present

## 2020-06-08 DIAGNOSIS — M25511 Pain in right shoulder: Secondary | ICD-10-CM | POA: Diagnosis not present

## 2020-06-09 ENCOUNTER — Telehealth: Payer: Self-pay | Admitting: Internal Medicine

## 2020-06-09 NOTE — Telephone Encounter (Signed)
recv'd records from [facility] forwarded to Dr. Billey Gosling 1/27/22fbg

## 2020-06-16 ENCOUNTER — Telehealth: Payer: Self-pay | Admitting: *Deleted

## 2020-06-16 NOTE — Telephone Encounter (Signed)
   [facility] Pre-operative Risk Assessment    HEARTCARE STAFF: - Please ensure there is not already an duplicate clearance open for this procedure. - Under Visit Info/Reason for Call, type in Other and utilize the format Clearance MM/DD/YY or Clearance TBD. Do not use dashes or single digits. - If request is for dental extraction, please clarify the # of teeth to be extracted.  Request for surgical clearance:  1. What type of surgery is being performed? RIGHT SHOULDER SCOPE, ROTATOR CUFF REPAIR   2. When is this surgery scheduled? 07/11/20   3. What type of clearance is required (medical clearance vs. Pharmacy clearance to hold med vs. Both)? MEDICAL  4. Are there any medications that need to be held prior to surgery and how long? ASA   5. Practice name and name of physician performing surgery? [facility]; DR. Elsie Saas   6. What is the office phone number? [phone]   7.   What is the office fax number? [place].   Anesthesia type (None, local, MAC, general) ? CHOICE   Julaine Hua 06/16/2020, 9:46 AM  _________________________________________________________________   (provider comments below)

## 2020-06-16 NOTE — Telephone Encounter (Signed)
   Primary Cardiologist: Followed with Truitt Merle for years, planning to establish with Dr. Stanford Breed at his annual visit 12/2020  Chart reviewed as part of pre-operative protocol coverage. Patient was contacted 06/16/2020 in reference to pre-operative risk assessment for pending surgery as outlined below.  Nicholas Paul was last seen on 12/30/19 by Truitt Merle, NP.  Since that day, Nicholas Paul has done well from a cardiac standpoint. Up until 2 weeks ago when he injured his shoulder he was very active. He can easily complete 4 METs without anginal complaints.  Therefore, based on ACC/AHA guidelines, the patient would be at acceptable risk for the planned procedure without further cardiovascular testing.   The patient was advised that if he develops new symptoms prior to surgery to contact our office to arrange for a follow-up visit, and he verbalized understanding.  Given lack of obstructive CAD, patient can hold aspirin 7 days prior to his upcoming surgery with plans to restart when cleared to do so by his orthopedist.   I will route this recommendation to the requesting party via [facility] fax function and remove from pre-op pool. Please call with questions.  Abigail Butts, PA-C 06/16/2020, 12:24 PM

## 2020-07-01 DIAGNOSIS — R310 Gross hematuria: Secondary | ICD-10-CM | POA: Diagnosis not present

## 2020-07-06 ENCOUNTER — Telehealth: Payer: Self-pay | Admitting: Pharmacist

## 2020-07-06 NOTE — Chronic Care Management (AMB) (Signed)
Chronic Care Management Pharmacy Assistant   Name: AMI THORNSBERRY  MRN: [id number] DOB: 06-15-1950  Reason for Encounter: Disease State/Hypertension Adherence Call  PCP : Binnie Rail, MD  Allergies:   Allergies  Allergen Reactions  . Doxazosin Mesylate Swelling    REACTION: edema on the generic (07-09-2019  Per pt this reaction was approx. 1990s).   . Crestor [Rosuvastatin]     Crestor 5 mg qd caused leg myalgias ; no issue with Lipitor 10 mg M, W, & F    Medications: Outpatient Encounter Medications as of 07/06/2020  Medication Sig  . amLODipine (NORVASC) 5 MG tablet TAKE 1 TABLET BY MOUTH EVERY DAY  . amoxicillin-clavulanate (AUGMENTIN) 875-125 MG tablet Take 1 tablet by mouth 2 (two) times daily.  Marland Kitchen aspirin EC 81 MG tablet Take 81 mg by mouth daily.  Marland Kitchen atorvastatin (LIPITOR) 10 MG tablet TAKE 1 TABLET BY MOUTH ON MONDAYS, WEDNESDAYS, AND FRIDAYS ONLY.  Marland Kitchen Cholecalciferol (VITAMIN D) 125 MCG (5000 UT) CAPS Take 125 mcg by mouth daily.   . Coenzyme Q10 (CO Q 10 PO) Take 1 capsule by mouth daily.  . diphenhydramine-acetaminophen (TYLENOL PM) 25-500 MG TABS tablet Take 1 tablet by mouth at bedtime as needed.  . famotidine (PEPCID) 10 MG tablet Take 10 mg by mouth as needed for heartburn or indigestion.  . hydrochlorothiazide (MICROZIDE) 12.5 MG capsule TAKE 1 CAPSULE BY MOUTH EVERY DAY  . Naproxen Sod-diphenhydrAMINE (ALEVE PM) 220-25 MG TABS Take 1 tablet by mouth as needed.   . Omega-3 Fatty Acids (OMEGA 3 PO) Take 1 capsule by mouth daily.   . psyllium (METAMUCIL) 58.6 % packet Take 2 packets by mouth 2 (two) times daily.   . quinapril (ACCUPRIL) 40 MG tablet TAKE 1 TABLET BY MOUTH EVERY DAY   No facility-administered encounter medications on file as of 07/06/2020.    Current Diagnosis: Patient Active Problem List   Diagnosis Date Noted  . Acute non-recurrent sinusitis 04/05/2020  . Head injury, closed, initial encounter 03/26/2019  . Rib pain on right side  08/25/2018  . Herpes zoster without complication 41/66/0630  . Urinary frequency 12/14/2016  . Hyperglycemia 09/03/2015  . Gilbert syndrome 07/30/2011  . Pulmonary nodule 05/21/2011  . ARTHRALGIA 07/14/2010  . LOW BACK PAIN SYNDROME 07/14/2010  . Irritable bowel syndrome 04/04/2010  . Actinic keratosis 10/20/2009  . History of colonic polyps 10/20/2009  . HEMATURIA, MICROSCOPIC, HX OF 10/20/2009  . Hyperlipidemia 07/31/2007  . Essential hypertension 07/31/2007    Reviewed chart prior to disease state call. Spoke with patient regarding BP  Recent Office Vitals: BP Readings from Last 3 Encounters:  12/30/19 110/70  07/14/19 137/75  12/23/18 130/70   Pulse Readings from Last 3 Encounters:  12/30/19 63  07/14/19 70  12/23/18 78    Wt Readings from Last 3 Encounters:  12/30/19 177 lb (80.3 kg)  07/14/19 180 lb 12.8 oz (82 kg)  12/23/18 179 lb 12.8 oz (81.6 kg)     Kidney Function Lab Results  Component Value Date/Time   CREATININE 1.02 12/30/2019 09:44 AM   CREATININE 1.00 07/14/2019 10:51 AM   GFR 80.56 11/06/2018 12:02 PM   GFRNONAA 74 12/30/2019 09:44 AM   GFRAA 86 12/30/2019 09:44 AM    BMP Latest Ref Rng & Units 12/30/2019 07/14/2019 11/06/2018  Glucose 65 - 99 mg/dL 104(H) 112(H) 104(H)  BUN 8 - 27 mg/dL 10 13 11   Creatinine 0.76 - 1.27 mg/dL 1.02 1.00 0.93  BUN/Creat Ratio 10 -  24 10 - -  Sodium 134 - 144 mmol/L 137 139 140  Potassium 3.5 - 5.2 mmol/L 4.6 4.0 4.0  Chloride 96 - 106 mmol/L 99 101 103  CO2 20 - 29 mmol/L 27 - 30  Calcium 8.6 - 10.2 mg/dL 9.5 - 9.0    . Current antihypertensive regimen:  o Amlodipine 5 mg tablet daily o Hydrochlorothiazide 12.5 mg tablet daily o Quinapril 40 mg tablet daily  . How often are you checking your Blood Pressure? 1-2x per week   . Current home BP readings: 118/70, 140/85  . What recent interventions/DTPs have been made by any provider to improve Blood Pressure control since last CPP Visit: Patient states he is  currently taking his medications as prescribed.  . Any recent hospitalizations or ED visits since last visit with CPP? Yes , 11/26/2019 Admission; Aspiration pneumonitis, Acute cystitis without hematuria, Acute metabolic encephalopathy  . What diet changes have been made to improve Blood Pressure Control?  o Patient states he eats everything in moderation. Patient states he in general eats healthy.  . What exercise is being done to improve your Blood Pressure Control?  o Patient states he is currently unable to exercise due to an injury to his right rotator cuff. Patient states he is having surgery to repair his right rotator cuff on Monday 07/11/2020 with orthopedic surgeon Dr. Noemi Chapel.  Adherence Review: Is the patient currently on ACE/ARB medication? Yes Does the patient have >5 day gap between last estimated fill dates? No  Patient declines to schedule a follow up with Clinical Pharmacist at this time. Patient states after his surgery and PT he may reschedule his appointment.  April D Calhoun, [place] Pharmacist Assistant [phone]   Total time: 33 minutes  Follow-Up:  Pharmacist Review

## 2020-07-11 DIAGNOSIS — M7541 Impingement syndrome of right shoulder: Secondary | ICD-10-CM | POA: Diagnosis not present

## 2020-07-11 DIAGNOSIS — M24111 Other articular cartilage disorders, right shoulder: Secondary | ICD-10-CM | POA: Diagnosis not present

## 2020-07-11 DIAGNOSIS — S46011A Strain of muscle(s) and tendon(s) of the rotator cuff of right shoulder, initial encounter: Secondary | ICD-10-CM | POA: Diagnosis not present

## 2020-07-11 DIAGNOSIS — G8918 Other acute postprocedural pain: Secondary | ICD-10-CM | POA: Diagnosis not present

## 2020-07-11 DIAGNOSIS — Y999 Unspecified external cause status: Secondary | ICD-10-CM | POA: Diagnosis not present

## 2020-07-11 DIAGNOSIS — X58XXXA Exposure to other specified factors, initial encounter: Secondary | ICD-10-CM | POA: Diagnosis not present

## 2020-07-11 DIAGNOSIS — S43491A Other sprain of right shoulder joint, initial encounter: Secondary | ICD-10-CM | POA: Diagnosis not present

## 2020-07-11 DIAGNOSIS — M94211 Chondromalacia, right shoulder: Secondary | ICD-10-CM | POA: Diagnosis not present

## 2020-07-19 DIAGNOSIS — M24111 Other articular cartilage disorders, right shoulder: Secondary | ICD-10-CM | POA: Diagnosis not present

## 2020-07-26 DIAGNOSIS — M6281 Muscle weakness (generalized): Secondary | ICD-10-CM | POA: Diagnosis not present

## 2020-07-26 DIAGNOSIS — M25611 Stiffness of right shoulder, not elsewhere classified: Secondary | ICD-10-CM | POA: Diagnosis not present

## 2020-07-26 DIAGNOSIS — M75121 Complete rotator cuff tear or rupture of right shoulder, not specified as traumatic: Secondary | ICD-10-CM | POA: Diagnosis not present

## 2020-07-26 DIAGNOSIS — M7551 Bursitis of right shoulder: Secondary | ICD-10-CM | POA: Diagnosis not present

## 2020-07-29 DIAGNOSIS — M25611 Stiffness of right shoulder, not elsewhere classified: Secondary | ICD-10-CM | POA: Diagnosis not present

## 2020-07-29 DIAGNOSIS — M75121 Complete rotator cuff tear or rupture of right shoulder, not specified as traumatic: Secondary | ICD-10-CM | POA: Diagnosis not present

## 2020-07-29 DIAGNOSIS — M7551 Bursitis of right shoulder: Secondary | ICD-10-CM | POA: Diagnosis not present

## 2020-07-29 DIAGNOSIS — M6281 Muscle weakness (generalized): Secondary | ICD-10-CM | POA: Diagnosis not present

## 2020-08-02 DIAGNOSIS — M75121 Complete rotator cuff tear or rupture of right shoulder, not specified as traumatic: Secondary | ICD-10-CM | POA: Diagnosis not present

## 2020-08-02 DIAGNOSIS — M6281 Muscle weakness (generalized): Secondary | ICD-10-CM | POA: Diagnosis not present

## 2020-08-02 DIAGNOSIS — M7551 Bursitis of right shoulder: Secondary | ICD-10-CM | POA: Diagnosis not present

## 2020-08-02 DIAGNOSIS — M25611 Stiffness of right shoulder, not elsewhere classified: Secondary | ICD-10-CM | POA: Diagnosis not present

## 2020-08-04 DIAGNOSIS — M75121 Complete rotator cuff tear or rupture of right shoulder, not specified as traumatic: Secondary | ICD-10-CM | POA: Diagnosis not present

## 2020-08-04 DIAGNOSIS — M7551 Bursitis of right shoulder: Secondary | ICD-10-CM | POA: Diagnosis not present

## 2020-08-04 DIAGNOSIS — M6281 Muscle weakness (generalized): Secondary | ICD-10-CM | POA: Diagnosis not present

## 2020-08-04 DIAGNOSIS — M25611 Stiffness of right shoulder, not elsewhere classified: Secondary | ICD-10-CM | POA: Diagnosis not present

## 2020-08-09 DIAGNOSIS — M25611 Stiffness of right shoulder, not elsewhere classified: Secondary | ICD-10-CM | POA: Diagnosis not present

## 2020-08-09 DIAGNOSIS — M75121 Complete rotator cuff tear or rupture of right shoulder, not specified as traumatic: Secondary | ICD-10-CM | POA: Diagnosis not present

## 2020-08-09 DIAGNOSIS — M6281 Muscle weakness (generalized): Secondary | ICD-10-CM | POA: Diagnosis not present

## 2020-08-09 DIAGNOSIS — M7551 Bursitis of right shoulder: Secondary | ICD-10-CM | POA: Diagnosis not present

## 2020-08-10 DIAGNOSIS — M25611 Stiffness of right shoulder, not elsewhere classified: Secondary | ICD-10-CM | POA: Diagnosis not present

## 2020-08-11 DIAGNOSIS — M7551 Bursitis of right shoulder: Secondary | ICD-10-CM | POA: Diagnosis not present

## 2020-08-11 DIAGNOSIS — M25611 Stiffness of right shoulder, not elsewhere classified: Secondary | ICD-10-CM | POA: Diagnosis not present

## 2020-08-11 DIAGNOSIS — M6281 Muscle weakness (generalized): Secondary | ICD-10-CM | POA: Diagnosis not present

## 2020-08-11 DIAGNOSIS — M75121 Complete rotator cuff tear or rupture of right shoulder, not specified as traumatic: Secondary | ICD-10-CM | POA: Diagnosis not present

## 2020-08-16 DIAGNOSIS — M7551 Bursitis of right shoulder: Secondary | ICD-10-CM | POA: Diagnosis not present

## 2020-08-16 DIAGNOSIS — M6281 Muscle weakness (generalized): Secondary | ICD-10-CM | POA: Diagnosis not present

## 2020-08-16 DIAGNOSIS — M25611 Stiffness of right shoulder, not elsewhere classified: Secondary | ICD-10-CM | POA: Diagnosis not present

## 2020-08-16 DIAGNOSIS — M75121 Complete rotator cuff tear or rupture of right shoulder, not specified as traumatic: Secondary | ICD-10-CM | POA: Diagnosis not present

## 2020-08-19 DIAGNOSIS — M7551 Bursitis of right shoulder: Secondary | ICD-10-CM | POA: Diagnosis not present

## 2020-08-19 DIAGNOSIS — M75121 Complete rotator cuff tear or rupture of right shoulder, not specified as traumatic: Secondary | ICD-10-CM | POA: Diagnosis not present

## 2020-08-19 DIAGNOSIS — M25611 Stiffness of right shoulder, not elsewhere classified: Secondary | ICD-10-CM | POA: Diagnosis not present

## 2020-08-19 DIAGNOSIS — M6281 Muscle weakness (generalized): Secondary | ICD-10-CM | POA: Diagnosis not present

## 2020-08-22 DIAGNOSIS — M25611 Stiffness of right shoulder, not elsewhere classified: Secondary | ICD-10-CM | POA: Diagnosis not present

## 2020-08-22 DIAGNOSIS — M6281 Muscle weakness (generalized): Secondary | ICD-10-CM | POA: Diagnosis not present

## 2020-08-22 DIAGNOSIS — M7551 Bursitis of right shoulder: Secondary | ICD-10-CM | POA: Diagnosis not present

## 2020-08-22 DIAGNOSIS — M75121 Complete rotator cuff tear or rupture of right shoulder, not specified as traumatic: Secondary | ICD-10-CM | POA: Diagnosis not present

## 2020-08-23 ENCOUNTER — Telehealth: Payer: Self-pay | Admitting: Pharmacist

## 2020-08-23 NOTE — Progress Notes (Signed)
Chronic Care Management Pharmacy Assistant   Name: Nicholas Paul  MRN: [id number] DOB: Oct 06, 1950   Reason for Encounter: Hypertension Disease State Call   Conditions to be addressed/monitored: HTN   Recent office visits:  None ID  Recent consult visits:  None ID  Hospital visits:  None in previous 6 months  Medications: Outpatient Encounter Medications as of 08/23/2020  Medication Sig  . amLODipine (NORVASC) 5 MG tablet TAKE 1 TABLET BY MOUTH EVERY DAY  . amoxicillin-clavulanate (AUGMENTIN) 875-125 MG tablet Take 1 tablet by mouth 2 (two) times daily.  Marland Kitchen aspirin EC 81 MG tablet Take 81 mg by mouth daily.  Marland Kitchen atorvastatin (LIPITOR) 10 MG tablet TAKE 1 TABLET BY MOUTH ON MONDAYS, WEDNESDAYS, AND FRIDAYS ONLY.  Marland Kitchen Cholecalciferol (VITAMIN D) 125 MCG (5000 UT) CAPS Take 125 mcg by mouth daily.   . Coenzyme Q10 (CO Q 10 PO) Take 1 capsule by mouth daily.  . diphenhydramine-acetaminophen (TYLENOL PM) 25-500 MG TABS tablet Take 1 tablet by mouth at bedtime as needed.  . famotidine (PEPCID) 10 MG tablet Take 10 mg by mouth as needed for heartburn or indigestion.  . hydrochlorothiazide (MICROZIDE) 12.5 MG capsule TAKE 1 CAPSULE BY MOUTH EVERY DAY  . Naproxen Sod-diphenhydrAMINE (ALEVE PM) 220-25 MG TABS Take 1 tablet by mouth as needed.   . Omega-3 Fatty Acids (OMEGA 3 PO) Take 1 capsule by mouth daily.   . psyllium (METAMUCIL) 58.6 % packet Take 2 packets by mouth 2 (two) times daily.   . quinapril (ACCUPRIL) 40 MG tablet TAKE 1 TABLET BY MOUTH EVERY DAY   No facility-administered encounter medications on file as of 08/23/2020.   Pharmacist Review  Reviewed chart prior to disease state call. Spoke with patient regarding BP  Recent Office Vitals: BP Readings from Last 3 Encounters:  12/30/19 110/70  07/14/19 137/75  12/23/18 130/70   Pulse Readings from Last 3 Encounters:  12/30/19 63  07/14/19 70  12/23/18 78    Wt Readings from Last 3 Encounters:  12/30/19 177 lb  (80.3 kg)  07/14/19 180 lb 12.8 oz (82 kg)  12/23/18 179 lb 12.8 oz (81.6 kg)     Kidney Function Lab Results  Component Value Date/Time   CREATININE 1.02 12/30/2019 09:44 AM   CREATININE 1.00 07/14/2019 10:51 AM   GFR 80.56 11/06/2018 12:02 PM   GFRNONAA 74 12/30/2019 09:44 AM   GFRAA 86 12/30/2019 09:44 AM    BMP Latest Ref Rng & Units 12/30/2019 07/14/2019 11/06/2018  Glucose 65 - 99 mg/dL 104(H) 112(H) 104(H)  BUN 8 - 27 mg/dL 10 13 11   Creatinine 0.76 - 1.27 mg/dL 1.02 1.00 0.93  BUN/Creat Ratio 10 - 24 10 - -  Sodium 134 - 144 mmol/L 137 139 140  Potassium 3.5 - 5.2 mmol/L 4.6 4.0 4.0  Chloride 96 - 106 mmol/L 99 101 103  CO2 20 - 29 mmol/L 27 - 30  Calcium 8.6 - 10.2 mg/dL 9.5 - 9.0    . Current antihypertensive regimen: The patient is taking amlodipine 5 mg daily, hydrochlorothiazide 12.5 daily, and quinapril 40 mg  . How often are you checking your Blood Pressure? The patient states that he does check blood pressure but had not done so in a few days  . Current home BP readings: The patient states that he does not have any readings at this time   . What recent interventions/DTPs have been made by any provider to improve Blood Pressure control since last CPP Visit:  None ID  . Any recent hospitalizations or ED visits since last visit with CPP? None ID  . What diet changes have been made to improve Blood Pressure Control? The patient states that he has not changed his diet  . What exercise is being done to improve your Blood Pressure Control?  The patient states that he has not exercised in a while just got his sling off today from shoulder injury  Adherence Review: Is the patient currently on ACE/ARB medication? Yes, Quinapril  Does the patient have >5 day gap between last estimated fill dates? No   Star Rating Drugs: Atorvastatin 08/11/20 90 ds Quinapril  06/07/20 90 ds  Ethelene Hal Clinical Pharmacist Assistant [phone]  Time spent:30

## 2020-08-24 DIAGNOSIS — M75121 Complete rotator cuff tear or rupture of right shoulder, not specified as traumatic: Secondary | ICD-10-CM | POA: Diagnosis not present

## 2020-08-24 DIAGNOSIS — M25611 Stiffness of right shoulder, not elsewhere classified: Secondary | ICD-10-CM | POA: Diagnosis not present

## 2020-08-24 DIAGNOSIS — M6281 Muscle weakness (generalized): Secondary | ICD-10-CM | POA: Diagnosis not present

## 2020-08-24 DIAGNOSIS — M7551 Bursitis of right shoulder: Secondary | ICD-10-CM | POA: Diagnosis not present

## 2020-09-01 DIAGNOSIS — M25611 Stiffness of right shoulder, not elsewhere classified: Secondary | ICD-10-CM | POA: Diagnosis not present

## 2020-09-01 DIAGNOSIS — M7551 Bursitis of right shoulder: Secondary | ICD-10-CM | POA: Diagnosis not present

## 2020-09-01 DIAGNOSIS — M6281 Muscle weakness (generalized): Secondary | ICD-10-CM | POA: Diagnosis not present

## 2020-09-01 DIAGNOSIS — M75121 Complete rotator cuff tear or rupture of right shoulder, not specified as traumatic: Secondary | ICD-10-CM | POA: Diagnosis not present

## 2020-09-02 DIAGNOSIS — Z23 Encounter for immunization: Secondary | ICD-10-CM | POA: Diagnosis not present

## 2020-09-06 DIAGNOSIS — M75121 Complete rotator cuff tear or rupture of right shoulder, not specified as traumatic: Secondary | ICD-10-CM | POA: Diagnosis not present

## 2020-09-06 DIAGNOSIS — M6281 Muscle weakness (generalized): Secondary | ICD-10-CM | POA: Diagnosis not present

## 2020-09-06 DIAGNOSIS — M25611 Stiffness of right shoulder, not elsewhere classified: Secondary | ICD-10-CM | POA: Diagnosis not present

## 2020-09-06 DIAGNOSIS — M7551 Bursitis of right shoulder: Secondary | ICD-10-CM | POA: Diagnosis not present

## 2020-09-08 DIAGNOSIS — M25611 Stiffness of right shoulder, not elsewhere classified: Secondary | ICD-10-CM | POA: Diagnosis not present

## 2020-09-08 DIAGNOSIS — M6281 Muscle weakness (generalized): Secondary | ICD-10-CM | POA: Diagnosis not present

## 2020-09-08 DIAGNOSIS — M75121 Complete rotator cuff tear or rupture of right shoulder, not specified as traumatic: Secondary | ICD-10-CM | POA: Diagnosis not present

## 2020-09-08 DIAGNOSIS — M7551 Bursitis of right shoulder: Secondary | ICD-10-CM | POA: Diagnosis not present

## 2020-09-13 DIAGNOSIS — M6281 Muscle weakness (generalized): Secondary | ICD-10-CM | POA: Diagnosis not present

## 2020-09-13 DIAGNOSIS — M75121 Complete rotator cuff tear or rupture of right shoulder, not specified as traumatic: Secondary | ICD-10-CM | POA: Diagnosis not present

## 2020-09-13 DIAGNOSIS — M25611 Stiffness of right shoulder, not elsewhere classified: Secondary | ICD-10-CM | POA: Diagnosis not present

## 2020-09-13 DIAGNOSIS — M7551 Bursitis of right shoulder: Secondary | ICD-10-CM | POA: Diagnosis not present

## 2020-09-16 DIAGNOSIS — M7551 Bursitis of right shoulder: Secondary | ICD-10-CM | POA: Diagnosis not present

## 2020-09-16 DIAGNOSIS — M75121 Complete rotator cuff tear or rupture of right shoulder, not specified as traumatic: Secondary | ICD-10-CM | POA: Diagnosis not present

## 2020-09-16 DIAGNOSIS — M6281 Muscle weakness (generalized): Secondary | ICD-10-CM | POA: Diagnosis not present

## 2020-09-16 DIAGNOSIS — M25611 Stiffness of right shoulder, not elsewhere classified: Secondary | ICD-10-CM | POA: Diagnosis not present

## 2020-09-19 DIAGNOSIS — M25611 Stiffness of right shoulder, not elsewhere classified: Secondary | ICD-10-CM | POA: Diagnosis not present

## 2020-09-19 DIAGNOSIS — M75121 Complete rotator cuff tear or rupture of right shoulder, not specified as traumatic: Secondary | ICD-10-CM | POA: Diagnosis not present

## 2020-09-19 DIAGNOSIS — M6281 Muscle weakness (generalized): Secondary | ICD-10-CM | POA: Diagnosis not present

## 2020-09-19 DIAGNOSIS — M7551 Bursitis of right shoulder: Secondary | ICD-10-CM | POA: Diagnosis not present

## 2020-09-21 DIAGNOSIS — M25611 Stiffness of right shoulder, not elsewhere classified: Secondary | ICD-10-CM | POA: Diagnosis not present

## 2020-09-21 DIAGNOSIS — M75121 Complete rotator cuff tear or rupture of right shoulder, not specified as traumatic: Secondary | ICD-10-CM | POA: Diagnosis not present

## 2020-09-21 DIAGNOSIS — M6281 Muscle weakness (generalized): Secondary | ICD-10-CM | POA: Diagnosis not present

## 2020-09-21 DIAGNOSIS — M7551 Bursitis of right shoulder: Secondary | ICD-10-CM | POA: Diagnosis not present

## 2020-09-26 DIAGNOSIS — M6281 Muscle weakness (generalized): Secondary | ICD-10-CM | POA: Diagnosis not present

## 2020-09-26 DIAGNOSIS — M25611 Stiffness of right shoulder, not elsewhere classified: Secondary | ICD-10-CM | POA: Diagnosis not present

## 2020-09-26 DIAGNOSIS — M7551 Bursitis of right shoulder: Secondary | ICD-10-CM | POA: Diagnosis not present

## 2020-09-26 DIAGNOSIS — M75121 Complete rotator cuff tear or rupture of right shoulder, not specified as traumatic: Secondary | ICD-10-CM | POA: Diagnosis not present

## 2020-09-30 DIAGNOSIS — M75121 Complete rotator cuff tear or rupture of right shoulder, not specified as traumatic: Secondary | ICD-10-CM | POA: Diagnosis not present

## 2020-09-30 DIAGNOSIS — M25611 Stiffness of right shoulder, not elsewhere classified: Secondary | ICD-10-CM | POA: Diagnosis not present

## 2020-09-30 DIAGNOSIS — M7551 Bursitis of right shoulder: Secondary | ICD-10-CM | POA: Diagnosis not present

## 2020-09-30 DIAGNOSIS — M6281 Muscle weakness (generalized): Secondary | ICD-10-CM | POA: Diagnosis not present

## 2020-10-03 DIAGNOSIS — M25611 Stiffness of right shoulder, not elsewhere classified: Secondary | ICD-10-CM | POA: Diagnosis not present

## 2020-10-03 DIAGNOSIS — M7551 Bursitis of right shoulder: Secondary | ICD-10-CM | POA: Diagnosis not present

## 2020-10-03 DIAGNOSIS — M6281 Muscle weakness (generalized): Secondary | ICD-10-CM | POA: Diagnosis not present

## 2020-10-03 DIAGNOSIS — M75121 Complete rotator cuff tear or rupture of right shoulder, not specified as traumatic: Secondary | ICD-10-CM | POA: Diagnosis not present

## 2020-10-04 DIAGNOSIS — M75121 Complete rotator cuff tear or rupture of right shoulder, not specified as traumatic: Secondary | ICD-10-CM | POA: Diagnosis not present

## 2020-10-13 DIAGNOSIS — M75121 Complete rotator cuff tear or rupture of right shoulder, not specified as traumatic: Secondary | ICD-10-CM | POA: Diagnosis not present

## 2020-10-13 DIAGNOSIS — M6281 Muscle weakness (generalized): Secondary | ICD-10-CM | POA: Diagnosis not present

## 2020-10-13 DIAGNOSIS — M7551 Bursitis of right shoulder: Secondary | ICD-10-CM | POA: Diagnosis not present

## 2020-10-13 DIAGNOSIS — M25611 Stiffness of right shoulder, not elsewhere classified: Secondary | ICD-10-CM | POA: Diagnosis not present

## 2020-10-14 ENCOUNTER — Telehealth: Payer: Self-pay | Admitting: Pharmacist

## 2020-10-14 NOTE — Progress Notes (Signed)
Chronic Care Management Pharmacy Assistant   Name: Nicholas Paul  MRN: [id number] DOB: 1950-08-03  Reason for Encounter: Disease State   Conditions to be addressed/monitored: HTN  Recent office visits:  None listed   Recent consult visits:  None listed  Hospital visits:  None in previous 6 months  Medications: Outpatient Encounter Medications as of 10/14/2020  Medication Sig  . amLODipine (NORVASC) 5 MG tablet TAKE 1 TABLET BY MOUTH EVERY DAY  . amoxicillin-clavulanate (AUGMENTIN) 875-125 MG tablet Take 1 tablet by mouth 2 (two) times daily.  Marland Kitchen aspirin EC 81 MG tablet Take 81 mg by mouth daily.  Marland Kitchen atorvastatin (LIPITOR) 10 MG tablet TAKE 1 TABLET BY MOUTH ON MONDAYS, WEDNESDAYS, AND FRIDAYS ONLY.  Marland Kitchen Cholecalciferol (VITAMIN D) 125 MCG (5000 UT) CAPS Take 125 mcg by mouth daily.   . Coenzyme Q10 (CO Q 10 PO) Take 1 capsule by mouth daily.  . diphenhydramine-acetaminophen (TYLENOL PM) 25-500 MG TABS tablet Take 1 tablet by mouth at bedtime as needed.  . famotidine (PEPCID) 10 MG tablet Take 10 mg by mouth as needed for heartburn or indigestion.  . hydrochlorothiazide (MICROZIDE) 12.5 MG capsule TAKE 1 CAPSULE BY MOUTH EVERY DAY  . Naproxen Sod-diphenhydrAMINE (ALEVE PM) 220-25 MG TABS Take 1 tablet by mouth as needed.   . Omega-3 Fatty Acids (OMEGA 3 PO) Take 1 capsule by mouth daily.   . psyllium (METAMUCIL) 58.6 % packet Take 2 packets by mouth 2 (two) times daily.   . quinapril (ACCUPRIL) 40 MG tablet TAKE 1 TABLET BY MOUTH EVERY DAY   No facility-administered encounter medications on file as of 10/14/2020.    Reviewed chart prior to disease state call. Spoke with patient regarding BP  Recent Office Vitals: BP Readings from Last 3 Encounters:  12/30/19 110/70  07/14/19 137/75  12/23/18 130/70   Pulse Readings from Last 3 Encounters:  12/30/19 63  07/14/19 70  12/23/18 78    Wt Readings from Last 3 Encounters:  12/30/19 177 lb (80.3 kg)  07/14/19 180 lb 12.8  oz (82 kg)  12/23/18 179 lb 12.8 oz (81.6 kg)     Kidney Function Lab Results  Component Value Date/Time   CREATININE 1.02 12/30/2019 09:44 AM   CREATININE 1.00 07/14/2019 10:51 AM   GFR 80.56 11/06/2018 12:02 PM   GFRNONAA 74 12/30/2019 09:44 AM   GFRAA 86 12/30/2019 09:44 AM    BMP Latest Ref Rng & Units 12/30/2019 07/14/2019 11/06/2018  Glucose 65 - 99 mg/dL 104(H) 112(H) 104(H)  BUN 8 - 27 mg/dL 10 13 11   Creatinine 0.76 - 1.27 mg/dL 1.02 1.00 0.93  BUN/Creat Ratio 10 - 24 10 - -  Sodium 134 - 144 mmol/L 137 139 140  Potassium 3.5 - 5.2 mmol/L 4.6 4.0 4.0  Chloride 96 - 106 mmol/L 99 101 103  CO2 20 - 29 mmol/L 27 - 30  Calcium 8.6 - 10.2 mg/dL 9.5 - 9.0    . Current antihypertensive regimen:    HCTZ 12.5 mg daily   Quinapril 40 mg HS  Amlodipine 5 mg daily    . How often are you checking your Blood Pressure? infrequently   . Current home BP readings:   Patient states his last reading was 122/78.   . What recent interventions/DTPs have been made by any provider to improve Blood Pressure control since last CPP Visit:   None noted  . Any recent hospitalizations or ED visits since last visit with CPP? No   .  What diet changes have been made to improve Blood Pressure Control?   Patient states he doesn't follow a special diet.  . What exercise is being done to improve your Blood Pressure Control?  o Patient states he is currently undergoing PT and has 2 more weeks left from his previous surgery on his rotator cuff done on Jul 11, 2020.  Adherence Review: Is the patient currently on ACE/ARB medication? Yes Does the patient have >5 day gap between last estimated fill dates? No  HCTZ - last fill 08/26/20 90D  Amlodipine - last fill 08/11/20 90D  Quinapril - last fill 09/03/20 90D  Star Rating Drugs: Atorvastatin - last fill 08/11/20 90D Quinapril - last fill 09/03/20 90D  Patient also states its time for his annual physical to be done for the year, I informed him to call  and have that scheduled with his PCP.  Orinda Kenner, [place] Clinical Pharmacists Assistant [phone]  Time Spent: [phone]

## 2020-10-19 DIAGNOSIS — M7551 Bursitis of right shoulder: Secondary | ICD-10-CM | POA: Diagnosis not present

## 2020-10-19 DIAGNOSIS — M6281 Muscle weakness (generalized): Secondary | ICD-10-CM | POA: Diagnosis not present

## 2020-10-19 DIAGNOSIS — M75121 Complete rotator cuff tear or rupture of right shoulder, not specified as traumatic: Secondary | ICD-10-CM | POA: Diagnosis not present

## 2020-10-19 DIAGNOSIS — M25611 Stiffness of right shoulder, not elsewhere classified: Secondary | ICD-10-CM | POA: Diagnosis not present

## 2020-10-28 DIAGNOSIS — M25611 Stiffness of right shoulder, not elsewhere classified: Secondary | ICD-10-CM | POA: Diagnosis not present

## 2020-10-28 DIAGNOSIS — M6281 Muscle weakness (generalized): Secondary | ICD-10-CM | POA: Diagnosis not present

## 2020-10-28 DIAGNOSIS — M7551 Bursitis of right shoulder: Secondary | ICD-10-CM | POA: Diagnosis not present

## 2020-10-28 DIAGNOSIS — M75121 Complete rotator cuff tear or rupture of right shoulder, not specified as traumatic: Secondary | ICD-10-CM | POA: Diagnosis not present

## 2020-11-16 NOTE — Progress Notes (Signed)
Subjective:    Patient ID: Nicholas Paul, male    DOB: 10-04-1950, 70 y.o.   MRN: [id number]  HPI The patient is here for follow up of their chronic medical problems, including htn, hld, hyperglycemia  He is exercising some - walking, golf.   He is eating good overall.    He tore his right rotator cuff since he was here last and had surgery and did well.   He has no concerns.    Medications and allergies reviewed with patient and updated if appropriate.  Patient Active Problem List   Diagnosis Date Noted   Head injury, closed, initial encounter 03/26/2019   Rib pain on right side 08/25/2018   Herpes zoster without complication 38/18/2993   Hyperglycemia 09/03/2015   Gilbert syndrome 07/30/2011   Pulmonary nodule 05/21/2011   ARTHRALGIA 07/14/2010   LOW BACK PAIN SYNDROME 07/14/2010   Irritable bowel syndrome 04/04/2010   Actinic keratosis 10/20/2009   History of colonic polyps 10/20/2009   HEMATURIA, MICROSCOPIC, HX OF 10/20/2009   Hyperlipidemia 07/31/2007   Essential hypertension 07/31/2007    Current Outpatient Medications on File Prior to Visit  Medication Sig Dispense Refill   amLODipine (NORVASC) 5 MG tablet TAKE 1 TABLET BY MOUTH EVERY DAY 90 tablet 3   aspirin EC 81 MG tablet Take 81 mg by mouth daily.     atorvastatin (LIPITOR) 10 MG tablet TAKE 1 TABLET BY MOUTH ON MONDAYS, WEDNESDAYS, AND FRIDAYS ONLY. 45 tablet 3   Cholecalciferol (VITAMIN D) 125 MCG (5000 UT) CAPS Take 125 mcg by mouth daily.      Coenzyme Q10 (CO Q 10 PO) Take 1 capsule by mouth daily.     diphenhydramine-acetaminophen (TYLENOL PM) 25-500 MG TABS tablet Take 1 tablet by mouth at bedtime as needed.     famotidine (PEPCID) 10 MG tablet Take 10 mg by mouth as needed for heartburn or indigestion.     hydrochlorothiazide (MICROZIDE) 12.5 MG capsule TAKE 1 CAPSULE BY MOUTH EVERY DAY 90 capsule 3   Naproxen Sod-diphenhydrAMINE (ALEVE PM) 220-25 MG TABS Take 1 tablet by mouth as needed.       Omega-3 Fatty Acids (OMEGA 3 PO) Take 1 capsule by mouth daily.      psyllium (METAMUCIL) 58.6 % packet Take 2 packets by mouth 2 (two) times daily.      quinapril (ACCUPRIL) 40 MG tablet TAKE 1 TABLET BY MOUTH EVERY DAY 90 tablet 3   No current facility-administered medications on file prior to visit.    Past Medical History:  Diagnosis Date   BPH (benign prostatic hyperplasia)    urologist--- dr Junious Silk   Coronary artery disease cardiologist--- Cecille Rubin gerhardt NP   last CTA 02-17-2018  nonobstructive proximal and mid LAD, calcium score 15;   nuclear stress test 06-09-2003 in epic,  no ischemia w/ ef 59%   GERD (gastroesophageal reflux disease)    H/O: rheumatic fever    noted after tonsillectomy   Hyperlipidemia    Hypertension    followed by cardiology   IBS (irritable bowel syndrome)    Dr Carlean Purl   Internal hemorrhoids    Pulmonary nodule    4 mm RML nodule on CT scan in 12/12. Needs follow up in one year.       Past Surgical History:  Procedure Laterality Date   COLONOSCOPY W/ POLYPECTOMY  2006   internal hemorrhoids 2011   THULIUM LASER TURP (TRANSURETHRAL RESECTION OF PROSTATE) N/A 07/14/2019   Procedure: Marcelino Duster LASER  TURP (TRANSURETHRAL RESECTION OF PROSTATE);  Surgeon: Festus Aloe, MD;  [facility]: [facility];  Service: Urology;  Laterality: N/A;   TONSILLECTOMY AND ADENOIDECTOMY      Social History   Socioeconomic History   Marital status: Married    Spouse name: Not on file   Number of children: 2   Years of education: Not on file   Highest education level: Not on file  Occupational History    Employer: [employer]  Tobacco Use   Smoking status: Never   Smokeless tobacco: Never  Vaping Use   Vaping Use: Never used  Substance and Sexual Activity   Alcohol use: Yes    Comment: occasional   Drug use: Never   Sexual activity: Yes    Comment: vasectomy  Other Topics Concern   Not on file  Social History Narrative   ** Merged  History Encounter **       Social Determinants of Health   Financial Resource Strain: Not on file  Food Insecurity: Not on file  Transportation Needs: Not on file  Physical Activity: Not on file  Stress: Not on file  Social Connections: Not on file    Family History  Problem Relation Age of Onset   Cancer Father        bladder cancer   Cancer Brother        bladder cancer   Hypertension Brother    Cancer Paternal Aunt        bladder cancer   Hyperlipidemia Mother    Hypertension Mother    Stroke Neg Hx    Diabetes Neg Hx    Heart attack Neg Hx    Colon cancer Neg Hx     Review of Systems  Constitutional:  Negative for chills and fever.  Respiratory:  Negative for cough, shortness of breath and wheezing.   Cardiovascular:  Negative for chest pain, palpitations and leg swelling.  Gastrointestinal:  Negative for abdominal pain, blood in stool, constipation, diarrhea and nausea.       No gerd  Neurological:  Negative for dizziness, light-headedness, numbness and headaches.  Psychiatric/Behavioral:  Negative for dysphoric mood. The patient is not nervous/anxious.       Objective:   Vitals:   11/17/20 0829  BP: 120/80  Pulse: 67  Temp: 97.9 F (36.6 C)  SpO2: 97%   BP Readings from Last 3 Encounters:  11/17/20 120/80  12/30/19 110/70  07/14/19 137/75   Wt Readings from Last 3 Encounters:  11/17/20 179 lb (81.2 kg)  12/30/19 177 lb (80.3 kg)  07/14/19 180 lb 12.8 oz (82 kg)   Body mass index is 26.05 kg/m.   Physical Exam    Constitutional: Appears well-developed and well-nourished. No distress.  HENT:  Head: Normocephalic and atraumatic.  Neck: Neck supple. No tracheal deviation present. No thyromegaly present.  No cervical lymphadenopathy Cardiovascular: Normal rate, regular rhythm and normal heart sounds.   No murmur heard. No carotid bruit .  No edema Pulmonary/Chest: Effort normal and breath sounds normal. No respiratory distress. No has no  wheezes. No rales.  Abdomen: soft, NT, ND, no HSM Skin: Skin is warm and dry. Not diaphoretic.  Psychiatric: Normal mood and affect. Behavior is normal.      Assessment & Plan:    I spent 20 minutes dedicated to the care of this patient on the date of this encounter including review of previous labs, imaging and procedures, speciality notes, obtaining history, communicating with the patient,  ordering medications, tests, and documenting clinical information in the EHR   See Problem List for Assessment and Plan of chronic medical problems.    This visit occurred during the SARS-CoV-2 public health emergency.  Safety protocols were in place, including screening questions prior to the visit, additional usage of staff PPE, and extensive cleaning of exam room while observing appropriate contact time as indicated for disinfecting solutions.

## 2020-11-16 NOTE — Patient Instructions (Addendum)
   Pneumonia vaccine given today.  Consider getting the shingles vaccine at your pharmacy.     Blood work was ordered.     Medications changes include :   none     Please followup in 1 year

## 2020-11-17 ENCOUNTER — Encounter: Payer: Self-pay | Admitting: Internal Medicine

## 2020-11-17 ENCOUNTER — Other Ambulatory Visit: Payer: Self-pay

## 2020-11-17 ENCOUNTER — Ambulatory Visit (INDEPENDENT_AMBULATORY_CARE_PROVIDER_SITE_OTHER): Payer: Medicare Other | Admitting: Internal Medicine

## 2020-11-17 VITALS — BP 120/80 | HR 67 | Temp 97.9°F | Ht 69.5 in | Wt 179.0 lb

## 2020-11-17 DIAGNOSIS — E7849 Other hyperlipidemia: Secondary | ICD-10-CM

## 2020-11-17 DIAGNOSIS — Z23 Encounter for immunization: Secondary | ICD-10-CM

## 2020-11-17 DIAGNOSIS — I10 Essential (primary) hypertension: Secondary | ICD-10-CM | POA: Diagnosis not present

## 2020-11-17 DIAGNOSIS — R739 Hyperglycemia, unspecified: Secondary | ICD-10-CM

## 2020-11-17 LAB — LIPID PANEL
Cholesterol: 146 mg/dL (ref 0–200)
HDL: 54.9 mg/dL (ref >39.00)
LDL Cholesterol: 75 mg/dL (ref 0–99)
NonHDL: 91.24
Total CHOL/HDL Ratio: 3
Triglycerides: 83 mg/dL (ref 0.0–149.0)
VLDL: 16.6 mg/dL (ref 0.0–40.0)

## 2020-11-17 LAB — COMPREHENSIVE METABOLIC PANEL
ALT: 17 U/L (ref 0–53)
AST: 16 U/L (ref 0–37)
Albumin: 4.3 g/dL (ref 3.5–5.2)
Alkaline Phosphatase: 66 U/L (ref 39–117)
BUN: 15 mg/dL (ref 6–23)
CO2: 32 mEq/L (ref 19–32)
Calcium: 9.3 mg/dL (ref 8.4–10.5)
Chloride: 103 mEq/L (ref 96–112)
Creatinine, Ser: 1.08 mg/dL (ref 0.40–1.50)
GFR: 69.26 mL/min (ref >60.00)
Glucose, Bld: 112 mg/dL — ABNORMAL HIGH (ref 70–99)
Potassium: 4.9 mEq/L (ref 3.5–5.1)
Sodium: 138 mEq/L (ref 135–145)
Total Bilirubin: 1.5 mg/dL — ABNORMAL HIGH (ref 0.2–1.2)
Total Protein: 7.2 g/dL (ref 6.0–8.3)

## 2020-11-17 LAB — HEMOGLOBIN A1C: Hgb A1c MFr Bld: 5.6 % (ref 4.6–6.5)

## 2020-11-17 NOTE — Assessment & Plan Note (Signed)
Chronic Check lipid panel  Continue atorvastatin 10 mg TIW Regular exercise and healthy diet encouraged

## 2020-11-17 NOTE — Assessment & Plan Note (Signed)
Chronic Check a1c Low sugar / carb diet Stressed regular exercise  

## 2020-11-17 NOTE — Addendum Note (Signed)
Addended by: Marcina Millard on: 11/17/2020 09:40 AM   Modules accepted: Orders

## 2020-11-17 NOTE — Assessment & Plan Note (Signed)
Chronic BP well controlled Continue hctz 12.5 mg qd, quinapril 40 mg qd cmp

## 2020-11-17 NOTE — Addendum Note (Signed)
Addended by: Jacobo Forest on: 11/17/2020 08:56 AM   Modules accepted: Orders

## 2020-11-22 ENCOUNTER — Other Ambulatory Visit: Payer: Self-pay | Admitting: *Deleted

## 2020-11-22 DIAGNOSIS — D229 Melanocytic nevi, unspecified: Secondary | ICD-10-CM | POA: Diagnosis not present

## 2020-11-22 DIAGNOSIS — L814 Other melanin hyperpigmentation: Secondary | ICD-10-CM | POA: Diagnosis not present

## 2020-11-22 DIAGNOSIS — Z85828 Personal history of other malignant neoplasm of skin: Secondary | ICD-10-CM | POA: Diagnosis not present

## 2020-11-22 DIAGNOSIS — L570 Actinic keratosis: Secondary | ICD-10-CM | POA: Diagnosis not present

## 2020-11-22 DIAGNOSIS — L819 Disorder of pigmentation, unspecified: Secondary | ICD-10-CM | POA: Diagnosis not present

## 2020-11-22 DIAGNOSIS — L905 Scar conditions and fibrosis of skin: Secondary | ICD-10-CM | POA: Diagnosis not present

## 2020-11-22 DIAGNOSIS — L821 Other seborrheic keratosis: Secondary | ICD-10-CM | POA: Diagnosis not present

## 2020-11-22 MED ORDER — HYDROCHLOROTHIAZIDE 12.5 MG PO CAPS: ORAL_CAPSULE | ORAL | 0 refills | Status: DC

## 2020-12-02 ENCOUNTER — Other Ambulatory Visit: Payer: Self-pay

## 2020-12-02 MED ORDER — QUINAPRIL HCL 40 MG PO TABS: 40.0000 mg | ORAL_TABLET | Freq: Every day | ORAL | 1 refills | Status: DC

## 2020-12-15 DIAGNOSIS — N400 Enlarged prostate without lower urinary tract symptoms: Secondary | ICD-10-CM | POA: Diagnosis not present

## 2020-12-20 ENCOUNTER — Telehealth: Payer: Self-pay | Admitting: Pharmacist

## 2020-12-20 NOTE — Progress Notes (Signed)
Chronic Care Management Pharmacy Assistant   Name: Nicholas Paul  MRN: [id number] DOB: 1951/02/16  Reason for Encounter: Disease State/Hypertension  Recent office visits:  11/17/20 Burns (PCP) - F/u Hypertension. D/C Amoxicillin. Return in 1 yr.   Recent consult visits:  11/22/20 Pearline Cables (Dermatology) - Melanocytic nevi, unspecified.  10/28/20 Ford (Physical Therapy)- Stiffness of right shoulder.   10/19/20 Ford (Physical Therapy)- Stiffness of right shoulder.    Hospital visits:  10/20/20 Rejeana Brock (Emergency Medicine)  [facility] ED. Urinary Pain. Start Keflex 500 mg.    Medications: Outpatient Encounter Medications as of 12/20/2020  Medication Sig   amLODipine (NORVASC) 5 MG tablet TAKE 1 TABLET BY MOUTH EVERY DAY   aspirin EC 81 MG tablet Take 81 mg by mouth daily.   atorvastatin (LIPITOR) 10 MG tablet TAKE 1 TABLET BY MOUTH ON MONDAYS, WEDNESDAYS, AND FRIDAYS ONLY.   Cholecalciferol (VITAMIN D) 125 MCG (5000 UT) CAPS Take 125 mcg by mouth daily.    Coenzyme Q10 (CO Q 10 PO) Take 1 capsule by mouth daily.   diphenhydramine-acetaminophen (TYLENOL PM) 25-500 MG TABS tablet Take 1 tablet by mouth at bedtime as needed.   famotidine (PEPCID) 10 MG tablet Take 10 mg by mouth as needed for heartburn or indigestion.   hydrochlorothiazide (MICROZIDE) 12.5 MG capsule TAKE 1 CAPSULE BY MOUTH EVERY DAY. For future refills please contact Dr. Jacalyn Lefevre office. Thank you   Naproxen Sod-diphenhydrAMINE (ALEVE PM) 220-25 MG TABS Take 1 tablet by mouth as needed.    Omega-3 Fatty Acids (OMEGA 3 PO) Take 1 capsule by mouth daily.    psyllium (METAMUCIL) 58.6 % packet Take 2 packets by mouth 2 (two) times daily.    quinapril (ACCUPRIL) 40 MG tablet Take 1 tablet (40 mg total) by mouth daily.   No facility-administered encounter medications on file as of 12/20/2020.    Reviewed chart for medication changes ahead of medication coordination call.  No OVs, Consults, or hospital visits since last care  coordination call/Pharmacist visit. (If appropriate, list visit date, provider name)  No medication changes indicated OR if recent visit, treatment plan here.  BP Readings from Last 3 Encounters:  11/17/20 120/80  12/30/19 110/70  07/14/19 137/75    Lab Results  Component Value Date   HGBA1C 5.6 11/17/2020     Reviewed chart prior to disease state call. Spoke with patient regarding BP  Recent Office Vitals: BP Readings from Last 3 Encounters:  11/17/20 120/80  12/30/19 110/70  07/14/19 137/75   Pulse Readings from Last 3 Encounters:  11/17/20 67  12/30/19 63  07/14/19 70    Wt Readings from Last 3 Encounters:  11/17/20 179 lb (81.2 kg)  12/30/19 177 lb (80.3 kg)  07/14/19 180 lb 12.8 oz (82 kg)     Kidney Function Lab Results  Component Value Date/Time   CREATININE 1.08 11/17/2020 08:56 AM   CREATININE 1.02 12/30/2019 09:44 AM   GFR 69.26 11/17/2020 08:56 AM   GFRNONAA 74 12/30/2019 09:44 AM   GFRAA 86 12/30/2019 09:44 AM    BMP Latest Ref Rng & Units 11/17/2020 12/30/2019 07/14/2019  Glucose 70 - 99 mg/dL 112(H) 104(H) 112(H)  BUN 6 - 23 mg/dL '15 10 13  '$ Creatinine 0.40 - 1.50 mg/dL 1.08 1.02 1.00  BUN/Creat Ratio 10 - 24 - 10 -  Sodium 135 - 145 mEq/L 138 137 139  Potassium 3.5 - 5.1 mEq/L 4.9 4.6 4.0  Chloride 96 - 112 mEq/L 103 99 101  CO2 19 -  32 mEq/L 32 27 -  Calcium 8.4 - 10.5 mg/dL 9.3 9.5 -    Current antihypertensive regimen:  HCTZ 12.5 mg daily Quinapril 40 mg HS Amlodipine 5 mg daily   How often are you checking your Blood Pressure?  1-2x per week  Current home BP readings:  8/28 - 122/82 8/17 - 126/84  What recent interventions/DTPs have been made by any provider to improve Blood Pressure control since last CPP Visit:  None noted  Any recent hospitalizations or ED visits since last visit with CPP? Yes  What diet changes have been made to improve Blood Pressure Control?  Patient states no recent diet changes.  What exercise is being  done to improve your Blood Pressure Control?  Patient states [place] weekend when he plays which is about 6.5 miles.  Adherence Review: Is the patient currently on ACE/ARB medication? No Does the patient have >5 day gap between last estimated fill dates? No HCTZ 12.5 mg - last fill 11/22/20 90D Amlodipine 5 mg - last fill 11/06/20 90D   Star Rating Drugs: Atorvastatin - last fill 11/06/20 45D Quinapril - last fill 12/02/20 [place], RMA Clinical Pharmacists Assistant [phone]  Time Spent: [phone]

## 2020-12-22 DIAGNOSIS — R3912 Poor urinary stream: Secondary | ICD-10-CM | POA: Diagnosis not present

## 2020-12-22 DIAGNOSIS — N401 Enlarged prostate with lower urinary tract symptoms: Secondary | ICD-10-CM | POA: Diagnosis not present

## 2021-01-10 NOTE — Chronic Care Management (AMB) (Signed)
error 

## 2021-02-13 DIAGNOSIS — Z23 Encounter for immunization: Secondary | ICD-10-CM | POA: Diagnosis not present

## 2021-02-18 ENCOUNTER — Other Ambulatory Visit: Payer: Self-pay | Admitting: Cardiology

## 2021-02-24 DIAGNOSIS — L821 Other seborrheic keratosis: Secondary | ICD-10-CM | POA: Diagnosis not present

## 2021-02-24 DIAGNOSIS — L738 Other specified follicular disorders: Secondary | ICD-10-CM | POA: Diagnosis not present

## 2021-02-24 DIAGNOSIS — L570 Actinic keratosis: Secondary | ICD-10-CM | POA: Diagnosis not present

## 2021-02-24 DIAGNOSIS — D1801 Hemangioma of skin and subcutaneous tissue: Secondary | ICD-10-CM | POA: Diagnosis not present

## 2021-02-24 DIAGNOSIS — D2362 Other benign neoplasm of skin of left upper limb, including shoulder: Secondary | ICD-10-CM | POA: Diagnosis not present

## 2021-03-03 NOTE — Progress Notes (Signed)
HPI: Follow-up coronary artery disease.  Previously followed by Truitt Merle; this is my first time seeing the patient.  Echocardiogram February 2013 showed normal LV function, mild left ventricular hypertrophy, grade 1 diastolic dysfunction.  Patient had a coronary CTA in October 2019 showing mixed plaque in the proximal and mid LAD without significant stenosis.  Calcium score 15.  Since last seen patient denies dyspnea, chest pain, palpitations or syncope.  No claudication.  He is very active.  Current Outpatient Medications  Medication Sig Dispense Refill   amLODipine (NORVASC) 5 MG tablet TAKE 1 TABLET BY MOUTH EVERY DAY 90 tablet 3   aspirin EC 81 MG tablet Take 81 mg by mouth daily.     atorvastatin (LIPITOR) 10 MG tablet TAKE 1 TABLET BY MOUTH ON MONDAYS, WEDNESDAYS, AND FRIDAYS ONLY. 45 tablet 3   Cholecalciferol (VITAMIN D) 125 MCG (5000 UT) CAPS Take 125 mcg by mouth daily.      Coenzyme Q10 (CO Q 10 PO) Take 1 capsule by mouth daily.     diphenhydramine-acetaminophen (TYLENOL PM) 25-500 MG TABS tablet Take 1 tablet by mouth at bedtime as needed.     famotidine (PEPCID) 10 MG tablet Take 10 mg by mouth as needed for heartburn or indigestion.     hydrochlorothiazide (MICROZIDE) 12.5 MG capsule TAKE 1 CAPSULE BY MOUTH EVERY DAY. FOR FUTURE REFILLS PLEASE CONTACT DR. Daylee Delahoz'S OFFICE 90 capsule 0   Naproxen Sod-diphenhydrAMINE (ALEVE PM) 220-25 MG TABS Take 1 tablet by mouth as needed.      Omega-3 Fatty Acids (OMEGA 3 PO) Take 1 capsule by mouth daily.      psyllium (METAMUCIL) 58.6 % packet Take 2 packets by mouth 2 (two) times daily.      quinapril (ACCUPRIL) 40 MG tablet Take 1 tablet (40 mg total) by mouth daily. 90 tablet 1   No current facility-administered medications for this visit.     Past Medical History:  Diagnosis Date   BPH (benign prostatic hyperplasia)    urologist--- dr Junious Silk   Coronary artery disease cardiologist--- Cecille Rubin gerhardt NP   last CTA  02-17-2018  nonobstructive proximal and mid LAD, calcium score 15;   nuclear stress test 06-09-2003 in epic,  no ischemia w/ ef 59%   GERD (gastroesophageal reflux disease)    H/O: rheumatic fever    noted after tonsillectomy   Hyperlipidemia    Hypertension    followed by cardiology   IBS (irritable bowel syndrome)    Dr Carlean Purl   Internal hemorrhoids    Pulmonary nodule    4 mm RML nodule on CT scan in 12/12. Needs follow up in one year.       Past Surgical History:  Procedure Laterality Date   COLONOSCOPY W/ POLYPECTOMY  2006   internal hemorrhoids 2011   THULIUM LASER TURP (TRANSURETHRAL RESECTION OF PROSTATE) N/A 07/14/2019   Procedure: THULIUM LASER TURP (TRANSURETHRAL RESECTION OF PROSTATE);  Surgeon: Festus Aloe, MD;  [facility];  Service: Urology;  Laterality: N/A;   TONSILLECTOMY AND ADENOIDECTOMY      Social History   Socioeconomic History   Marital status: Married    Spouse name: Not on file   Number of children: 2   Years of education: Not on file   Highest education level: Not on file  Occupational History    Employer: [employer]  Tobacco Use   Smoking status: Never   Smokeless tobacco: Never  Vaping Use  Vaping Use: Never used  Substance and Sexual Activity   Alcohol use: Yes    Comment: occasional   Drug use: Never   Sexual activity: Yes    Comment: vasectomy  Other Topics Concern   Not on file  Social History Narrative   ** Merged History Encounter **       Social Determinants of Health   Financial Resource Strain: Not on file  Food Insecurity: Not on file  Transportation Needs: Not on file  Physical Activity: Not on file  Stress: Not on file  Social Connections: Not on file  Intimate Partner Violence: Not on file    Family History  Problem Relation Age of Onset   Cancer Father        bladder cancer   Cancer Brother        bladder cancer   Hypertension Brother    Cancer Paternal Aunt         bladder cancer   Hyperlipidemia Mother    Hypertension Mother    Stroke Neg Hx    Diabetes Neg Hx    Heart attack Neg Hx    Colon cancer Neg Hx     ROS: no fevers or chills, productive cough, hemoptysis, dysphasia, odynophagia, melena, hematochezia, dysuria, hematuria, rash, seizure activity, orthopnea, PND, pedal edema, claudication. Remaining systems are negative.  Physical Exam: Well-developed well-nourished in no acute distress.  Skin is warm and dry.  HEENT is normal.  Neck is supple.  Chest is clear to auscultation with normal expansion.  Cardiovascular exam is regular rate and rhythm.  Abdominal exam nontender or distended. No masses palpated. Extremities show no edema. neuro grossly intact  ECG-normal sinus rhythm at a rate of 60, left axis deviation, nonspecific ST changes.  Personally reviewed  A/P  1 coronary artery disease-nonobstructive on last CTA.  Denies chest pain.  Plan to continue medical therapy with aspirin and statin.  2 hypertension-patient's blood pressure is controlled.  Continue present medications and follow.    3 hyperlipidemia-patient had some myalgias in the past.  However recent LDL 75.  Increase Lipitor to 10 mg daily to see if he tolerates.  Check lipids and liver in 8 weeks.  Kirk Ruths, MD

## 2021-03-10 ENCOUNTER — Other Ambulatory Visit: Payer: Self-pay

## 2021-03-10 ENCOUNTER — Encounter: Payer: Self-pay | Admitting: Cardiology

## 2021-03-10 ENCOUNTER — Ambulatory Visit (INDEPENDENT_AMBULATORY_CARE_PROVIDER_SITE_OTHER): Payer: Medicare Other | Admitting: Cardiology

## 2021-03-10 VITALS — BP 125/80 | HR 60 | Ht 69.0 in | Wt 180.0 lb

## 2021-03-10 DIAGNOSIS — I10 Essential (primary) hypertension: Secondary | ICD-10-CM

## 2021-03-10 DIAGNOSIS — I2583 Coronary atherosclerosis due to lipid rich plaque: Secondary | ICD-10-CM | POA: Diagnosis not present

## 2021-03-10 DIAGNOSIS — I2510 Atherosclerotic heart disease of native coronary artery without angina pectoris: Secondary | ICD-10-CM | POA: Diagnosis not present

## 2021-03-10 DIAGNOSIS — E7849 Other hyperlipidemia: Secondary | ICD-10-CM | POA: Diagnosis not present

## 2021-03-10 MED ORDER — ATORVASTATIN CALCIUM 10 MG PO TABS: 10.0000 mg | ORAL_TABLET | Freq: Every day | ORAL | 3 refills | Status: DC

## 2021-03-10 NOTE — Patient Instructions (Signed)
Medication Instructions:   INCREASE ATORVASTATIN TO 10 MG EVERYDAY  *If you need a refill on your cardiac medications before your next appointment, please call your pharmacy*   Lab Work:  Your physician recommends that you return for lab work in: [place]  If you have labs (blood work) drawn today and your tests are completely normal, you will receive your results only by: [place] (if you have MyChart) OR A paper copy in the mail If you have any lab test that is abnormal or we need to change your treatment, we will call you to review the results.   Follow-Up: At [facility], you and your health needs are our priority.  As part of our continuing mission to provide you with exceptional heart care, we have created designated Provider Care Teams.  These Care Teams include your primary Cardiologist (physician) and Advanced Practice Providers (APPs -  Physician Assistants and Nurse Practitioners) who all work together to provide you with the care you need, when you need it.  We recommend signing up for the patient portal called "MyChart".  Sign up information is provided on this After Visit Summary.  MyChart is used to connect with patients for Virtual Visits (Telemedicine).  Patients are able to view lab/test results, encounter notes, upcoming appointments, etc.  Non-urgent messages can be sent to your provider as well.   To learn more about what you can do with MyChart, go to [email or web address].    Your next appointment:   12 month(s)  The format for your next appointment:   In Person  Provider:   Kirk Ruths, MD

## 2021-04-17 ENCOUNTER — Encounter: Payer: Self-pay | Admitting: *Deleted

## 2021-04-21 DIAGNOSIS — E7849 Other hyperlipidemia: Secondary | ICD-10-CM | POA: Diagnosis not present

## 2021-04-21 DIAGNOSIS — I2510 Atherosclerotic heart disease of native coronary artery without angina pectoris: Secondary | ICD-10-CM | POA: Diagnosis not present

## 2021-04-21 DIAGNOSIS — I2583 Coronary atherosclerosis due to lipid rich plaque: Secondary | ICD-10-CM | POA: Diagnosis not present

## 2021-04-21 LAB — LIPID PANEL
Chol/HDL Ratio: 2.4 ratio (ref 0.0–5.0)
Cholesterol, Total: 137 mg/dL (ref 100–199)
HDL: 57 mg/dL (ref >39)
LDL Chol Calc (NIH): 67 mg/dL (ref 0–99)
Triglycerides: 63 mg/dL (ref 0–149)
VLDL Cholesterol Cal: 13 mg/dL (ref 5–40)

## 2021-04-21 LAB — HEPATIC FUNCTION PANEL
ALT: 18 IU/L (ref 0–44)
AST: 21 IU/L (ref 0–40)
Albumin: 4.5 g/dL (ref 3.7–4.7)
Alkaline Phosphatase: 88 IU/L (ref 44–121)
Bilirubin Total: 1 mg/dL (ref 0.0–1.2)
Bilirubin, Direct: 0.27 mg/dL (ref 0.00–0.40)
Total Protein: 6.9 g/dL (ref 6.0–8.5)

## 2021-04-24 ENCOUNTER — Other Ambulatory Visit: Payer: Self-pay

## 2021-04-25 MED ORDER — AMLODIPINE BESYLATE 5 MG PO TABS: 5.0000 mg | ORAL_TABLET | Freq: Every day | ORAL | 3 refills | Status: DC

## 2021-05-16 DIAGNOSIS — L570 Actinic keratosis: Secondary | ICD-10-CM | POA: Diagnosis not present

## 2021-05-16 DIAGNOSIS — L738 Other specified follicular disorders: Secondary | ICD-10-CM | POA: Diagnosis not present

## 2021-05-24 ENCOUNTER — Other Ambulatory Visit: Payer: Self-pay | Admitting: Cardiology

## 2021-05-27 ENCOUNTER — Other Ambulatory Visit: Payer: Self-pay | Admitting: Cardiology

## 2021-06-13 ENCOUNTER — Telehealth: Payer: Self-pay | Admitting: Pharmacist

## 2021-06-13 DIAGNOSIS — I10 Essential (primary) hypertension: Secondary | ICD-10-CM

## 2021-06-13 MED ORDER — QUINAPRIL HCL 40 MG PO TABS: 40.0000 mg | ORAL_TABLET | Freq: Every day | ORAL | 1 refills | Status: DC

## 2021-06-13 NOTE — Telephone Encounter (Signed)
Patient last quinapril prescription was affected by the recall.  Sending new Rx.

## 2021-06-14 ENCOUNTER — Other Ambulatory Visit: Payer: Self-pay | Admitting: Cardiology

## 2021-06-14 DIAGNOSIS — I10 Essential (primary) hypertension: Secondary | ICD-10-CM

## 2021-06-30 ENCOUNTER — Ambulatory Visit: Payer: Medicare Other

## 2021-07-10 ENCOUNTER — Ambulatory Visit: Payer: Medicare Other

## 2021-07-10 ENCOUNTER — Ambulatory Visit (INDEPENDENT_AMBULATORY_CARE_PROVIDER_SITE_OTHER): Payer: Medicare Other

## 2021-07-10 DIAGNOSIS — Z0000 Encounter for general adult medical examination without abnormal findings: Secondary | ICD-10-CM

## 2021-07-10 NOTE — Patient Instructions (Signed)
[name] , Thank you for taking time to come for your Medicare Wellness Visit. I appreciate your ongoing commitment to your health goals. Please review the following plan we discussed and let me know if I can assist you in the future.   Screening recommendations/referrals: Colonoscopy: 07/12/2016; due every 5 years (due 07/12/2021) Recommended yearly ophthalmology/optometry visit for glaucoma screening and checkup Recommended yearly dental visit for hygiene and checkup  Vaccinations: Influenza vaccine: 02/13/2021 Pneumococcal vaccine: 11/06/2018, 11/17/2020 Tdap vaccine: 01/04/2011; due every 10 years (overdue) Shingles vaccine: never done   Covid-19: 06/02/2019, 06/23/2019, 03/01/2020, 09/02/2020, 02/13/2021  Advanced directives: Advance directive discussed with you today. Even though you declined this today please call our office should you change your mind and we can give you the proper paperwork for you to fill out.  Conditions/risks identified: Yes; my goal is to stay healthy and well.  Next appointment: Please schedule your next Medicare Wellness Visit with your Nurse Health Advisor in 1 year by calling [phone].  Preventive Care 72 Years and Older, Male Preventive care refers to lifestyle choices and visits with your health care provider that can promote health and wellness. What does preventive care include? A yearly physical exam. This is also called an annual well check. Dental exams once or twice a year. Routine eye exams. Ask your health care provider how often you should have your eyes checked. Personal lifestyle choices, including: Daily care of your teeth and gums. Regular physical activity. Eating a healthy diet. Avoiding tobacco and drug use. Limiting alcohol use. Practicing safe sex. Taking low doses of aspirin every day. Taking vitamin and mineral supplements as recommended by your health care provider. What happens during an annual well check? The services and  screenings done by your health care provider during your annual well check will depend on your age, overall health, lifestyle risk factors, and family history of disease. Counseling  Your health care provider may ask you questions about your: Alcohol use. Tobacco use. Drug use. Emotional well-being. Home and relationship well-being. Sexual activity. Eating habits. History of falls. Memory and ability to understand (cognition). Work and work Statistician. Screening  You may have the following tests or measurements: Height, weight, and BMI. Blood pressure. Lipid and cholesterol levels. These may be checked every 5 years, or more frequently if you are over 72 years old. Skin check. Lung cancer screening. You may have this screening every year starting at age 72 if you have a 30-pack-year history of smoking and currently smoke or have quit within the past 15 years. Fecal occult blood test (FOBT) of the stool. You may have this test every year starting at age 72. Flexible sigmoidoscopy or colonoscopy. You may have a sigmoidoscopy every 5 years or a colonoscopy every 10 years starting at age 72. Prostate cancer screening. Recommendations will vary depending on your family history and other risks. Hepatitis C blood test. Hepatitis B blood test. Sexually transmitted disease (STD) testing. Diabetes screening. This is done by checking your blood sugar (glucose) after you have not eaten for a while (fasting). You may have this done every 1-3 years. Abdominal aortic aneurysm (AAA) screening. You may need this if you are a current or former smoker. Osteoporosis. You may be screened starting at age 72 if you are at high risk. Talk with your health care provider about your test results, treatment options, and if necessary, the need for more tests. Vaccines  Your health care provider may recommend certain vaccines, such as: Influenza vaccine. This  is recommended every year. Tetanus, diphtheria, and  acellular pertussis (Tdap, Td) vaccine. You may need a Td booster every 10 years. Zoster vaccine. You may need this after age 72. Pneumococcal 13-valent conjugate (PCV13) vaccine. One dose is recommended after age 72. Pneumococcal polysaccharide (PPSV23) vaccine. One dose is recommended after age 72. Talk to your health care provider about which screenings and vaccines you need and how often you need them. This information is not intended to replace advice given to you by your health care provider. Make sure you discuss any questions you have with your health care provider. Document Released: 05/27/2015 Document Revised: 01/18/2016 Document Reviewed: 03/01/2015 Elsevier Interactive Patient Education  2017 [place] Prevention in the Home Falls can cause injuries. They can happen to people of all ages. There are many things you can do to make your home safe and to help prevent falls. What can I do on the outside of my home? Regularly fix the edges of walkways and driveways and fix any cracks. Remove anything that might make you trip as you walk through a door, such as a raised step or threshold. Trim any bushes or trees on the path to your home. Use bright outdoor lighting. Clear any walking paths of anything that might make someone trip, such as rocks or tools. Regularly check to see if handrails are loose or broken. Make sure that both sides of any steps have handrails. Any raised decks and porches should have guardrails on the edges. Have any leaves, snow, or ice cleared regularly. Use sand or salt on walking paths during winter. Clean up any spills in your garage right away. This includes oil or grease spills. What can I do in the bathroom? Use night lights. Install grab bars by the toilet and in the tub and shower. Do not use towel bars as grab bars. Use non-skid mats or decals in the tub or shower. If you need to sit down in the shower, use a plastic, non-slip stool. Keep  the floor dry. Clean up any water that spills on the floor as soon as it happens. Remove soap buildup in the tub or shower regularly. Attach bath mats securely with double-sided non-slip rug tape. Do not have throw rugs and other things on the floor that can make you trip. What can I do in the bedroom? Use night lights. Make sure that you have a light by your bed that is easy to reach. Do not use any sheets or blankets that are too big for your bed. They should not hang down onto the floor. Have a firm chair that has side arms. You can use this for support while you get dressed. Do not have throw rugs and other things on the floor that can make you trip. What can I do in the kitchen? Clean up any spills right away. Avoid walking on wet floors. Keep items that you use a lot in easy-to-reach places. If you need to reach something above you, use a strong step stool that has a grab bar. Keep electrical cords out of the way. Do not use floor polish or wax that makes floors slippery. If you must use wax, use non-skid floor wax. Do not have throw rugs and other things on the floor that can make you trip. What can I do with my stairs? Do not leave any items on the stairs. Make sure that there are handrails on both sides of the stairs and use them. Fix handrails that  are broken or loose. Make sure that handrails are as long as the stairways. Check any carpeting to make sure that it is firmly attached to the stairs. Fix any carpet that is loose or worn. Avoid having throw rugs at the top or bottom of the stairs. If you do have throw rugs, attach them to the floor with carpet tape. Make sure that you have a light switch at the top of the stairs and the bottom of the stairs. If you do not have them, ask someone to add them for you. What else can I do to help prevent falls? Wear shoes that: Do not have high heels. Have rubber bottoms. Are comfortable and fit you well. Are closed at the toe. Do not  wear sandals. If you use a stepladder: Make sure that it is fully opened. Do not climb a closed stepladder. Make sure that both sides of the stepladder are locked into place. Ask someone to hold it for you, if possible. Clearly mark and make sure that you can see: Any grab bars or handrails. First and last steps. Where the edge of each step is. Use tools that help you move around (mobility aids) if they are needed. These include: Canes. Walkers. Scooters. Crutches. Turn on the lights when you go into a dark area. Replace any light bulbs as soon as they burn out. Set up your furniture so you have a clear path. Avoid moving your furniture around. If any of your floors are uneven, fix them. If there are any pets around you, be aware of where they are. Review your medicines with your doctor. Some medicines can make you feel dizzy. This can increase your chance of falling. Ask your doctor what other things that you can do to help prevent falls. This information is not intended to replace advice given to you by your health care provider. Make sure you discuss any questions you have with your health care provider. Document Released: 02/24/2009 Document Revised: 10/06/2015 Document Reviewed: 06/04/2014 Elsevier Interactive Patient Education  2017 [employer].

## 2021-07-10 NOTE — Progress Notes (Signed)
I connected with Nicholas Paul today by telephone and verified that I am speaking with the correct person using two identifiers. Location patient: home Location provider: work Persons participating in the virtual visit: patient, provider.   I discussed the limitations, risks, security and privacy concerns of performing an evaluation and management service by telephone and the availability of in person appointments. I also discussed with the patient that there may be a patient responsible charge related to this service. The patient expressed understanding and verbally consented to this telephonic visit.    Interactive audio and video telecommunications were attempted between this provider and patient, however failed, due to patient having technical difficulties OR patient did not have access to video capability.  We continued and completed visit with audio only.  Some vital signs may be absent or patient reported.   Time Spent with patient on telephone encounter: 40 minutes  Subjective:   Nicholas Paul is a 71 y.o. male who presents for Medicare Annual/Subsequent preventive examination.  Review of Systems     Cardiac Risk Factors include: advanced age (>30men, >109 women);hypertension;family history of premature cardiovascular disease;male gender;dyslipidemia     Objective:    There were no vitals filed for this visit. There is no height or weight on file to calculate BMI.  Advanced Directives 07/10/2021 07/14/2019 06/28/2016  Does Patient Have a Medical Advance Directive? No No Yes  Type of Advance Directive - Public librarian;Living will  Would patient like information on creating a medical advance directive? No - Patient declined No - Patient declined -    Current Medications (verified) Outpatient Encounter Medications as of 07/10/2021  Medication Sig   amLODipine (NORVASC) 5 MG tablet Take 1 tablet (5 mg total) by mouth daily.   aspirin EC 81 MG tablet Take 81 mg  by mouth daily.   atorvastatin (LIPITOR) 10 MG tablet Take 1 tablet (10 mg total) by mouth daily.   benazepril (LOTENSIN) 40 MG tablet Take 1 tablet (40 mg total) by mouth daily.   Cholecalciferol (VITAMIN D) 125 MCG (5000 UT) CAPS Take 125 mcg by mouth daily.    Coenzyme Q10 (CO Q 10 PO) Take 1 capsule by mouth daily.   diphenhydramine-acetaminophen (TYLENOL PM) 25-500 MG TABS tablet Take 1 tablet by mouth at bedtime as needed.   famotidine (PEPCID) 10 MG tablet Take 10 mg by mouth as needed for heartburn or indigestion.   hydrochlorothiazide (MICROZIDE) 12.5 MG capsule TAKE 1 CAPSULE BY MOUTH EVERY DAY. FOR FUTURE REFILLS PLEASE CONTACT DR. CRENSHAW'S OFFICE   Naproxen Sod-diphenhydrAMINE (ALEVE PM) 220-25 MG TABS Take 1 tablet by mouth as needed.    Omega-3 Fatty Acids (OMEGA 3 PO) Take 1 capsule by mouth daily.    psyllium (METAMUCIL) 58.6 % packet Take 2 packets by mouth 2 (two) times daily.    No facility-administered encounter medications on file as of 07/10/2021.    Allergies (verified) Doxazosin mesylate and Crestor [rosuvastatin]   History: Past Medical History:  Diagnosis Date   BPH (benign prostatic hyperplasia)    urologist--- dr Junious Silk   Coronary artery disease cardiologist--- Cecille Rubin gerhardt NP   last CTA 02-17-2018  nonobstructive proximal and mid LAD, calcium score 15;   nuclear stress test 06-09-2003 in epic,  no ischemia w/ ef 59%   GERD (gastroesophageal reflux disease)    H/O: rheumatic fever    noted after tonsillectomy   Hyperlipidemia    Hypertension    followed by cardiology   IBS (irritable  bowel syndrome)    Dr Carlean Purl   Internal hemorrhoids    Pulmonary nodule    4 mm RML nodule on CT scan in 12/12. Needs follow up in one year.      Past Surgical History:  Procedure Laterality Date   COLONOSCOPY W/ POLYPECTOMY  2006   internal hemorrhoids 2011   THULIUM LASER TURP (TRANSURETHRAL RESECTION OF PROSTATE) N/A 07/14/2019   Procedure: THULIUM LASER TURP  (TRANSURETHRAL RESECTION OF PROSTATE);  Surgeon: Festus Aloe, MD;  Location: [facility];  Service: Urology;  Laterality: N/A;   TONSILLECTOMY AND ADENOIDECTOMY     Family History  Problem Relation Age of Onset   Cancer Father        bladder cancer   Cancer Brother        bladder cancer   Hypertension Brother    Cancer Paternal Aunt        bladder cancer   Hyperlipidemia Mother    Hypertension Mother    Stroke Neg Hx    Diabetes Neg Hx    Heart attack Neg Hx    Colon cancer Neg Hx    Social History   Socioeconomic History   Marital status: Married    Spouse name: Not on file   Number of children: 2   Years of education: Not on file   Highest education level: Not on file  Occupational History    Employer: [employer]  Tobacco Use   Smoking status: Never   Smokeless tobacco: Never  Vaping Use   Vaping Use: Never used  Substance and Sexual Activity   Alcohol use: Yes    Comment: occasional   Drug use: Never   Sexual activity: Yes    Comment: vasectomy  Other Topics Concern   Not on file  Social History Narrative   ** Merged History Encounter **       Social Determinants of Health   Financial Resource Strain: Low Risk    Difficulty of Paying Living Expenses: Not hard at all  Food Insecurity: No Food Insecurity   Worried About Charity fundraiser in the Last Year: Never true   Ran Out of Food in the Last Year: Never true  Transportation Needs: No Transportation Needs   Lack of Transportation (Medical): No   Lack of Transportation (Non-Medical): No  Physical Activity: Sufficiently Active   Days of Exercise per Week: 5 days   Minutes of Exercise per Session: 30 min  Stress: No Stress Concern Present   Feeling of Stress : Not at all  Social Connections: Socially Integrated   Frequency of Communication with Friends and Family: More than three times a week   Frequency of Social Gatherings with Friends and Family: More than three times a  week   Attends Religious Services: More than 4 times per year   Active Member of [employer] or Organizations: Yes   Attends Music therapist: More than 4 times per year   Marital Status: Married    Tobacco Counseling Counseling given: Not Answered   Clinical Intake:  Pre-visit preparation completed: Yes  Pain : No/denies pain     Nutritional Risks: None Diabetes: No  How often do you need to have someone help you when you read instructions, pamphlets, or other written materials from your doctor or pharmacy?: 1 - Never What is the last grade level you completed in school?: Sophmore year in college; works for a [employer] firm  Diabetic? no  Interpreter Needed?: No  Information entered by :: Lisette Abu, LPN   Activities of Daily Living In your present state of health, do you have any difficulty performing the following activities: 07/10/2021 11/17/2020  Hearing? N N  Vision? N N  Difficulty concentrating or making decisions? N N  Walking or climbing stairs? N N  Dressing or bathing? N N  Doing errands, shopping? N N  Preparing Food and eating ? N -  Using the Toilet? N -  In the past six months, have you accidently leaked urine? N -  Do you have problems with loss of bowel control? N -  Managing your Medications? N -  Managing your Finances? N -  Housekeeping or managing your Housekeeping? N -  Some recent data might be hidden    Patient Care Team: Binnie Rail, MD as PCP - General (Internal Medicine) Kerry Fort., MD as Consulting Physician (Urology) Charlton Haws, [facility] as Pharmacist (Pharmacist) [facility] as Consulting Physician (Optometry)  Indicate any recent Medical Services you may have received from other than [facility] providers in the past year (date may be approximate).     Assessment:   This is a routine wellness examination for Nicholas Paul.  Hearing/Vision screen Hearing Screening - Comments:: Patient wears hearing  aids. Vision Screening - Comments:: Patient wears eyeglasses. Annual eye exam done by [facility].  Dietary issues and exercise activities discussed: Current Exercise Habits: Home exercise routine;Structured exercise class, Type of exercise: walking;treadmill;stretching;strength training/weights, Time (Minutes): 30, Frequency (Times/Week): 5, Weekly Exercise (Minutes/Week): 150, Intensity: Moderate, Exercise limited by: None identified   Goals Addressed             This Visit's Progress    Client understands the importance of follow-up with providers by attending scheduled visits       My goal is to stay healthy and well.      Depression Screen PHQ 2/9 Scores 07/10/2021 11/17/2020 11/06/2018 09/01/2015  PHQ - 2 Score 0 0 0 0    Fall Risk Fall Risk  07/10/2021 11/17/2020 11/06/2018 09/01/2015  Falls in the past year? 0 1 0 No  Number falls in past yr: 0 0 0 -  Injury with Fall? 0 1 - -  Risk for fall due to : No Fall Risks No Fall Risks - -  Follow up Falls evaluation completed Falls evaluation completed - -    FALL RISK PREVENTION PERTAINING TO THE HOME:  Any stairs in or around the home? Yes  If so, are there any without handrails? No  Home free of loose throw rugs in walkways, pet beds, electrical cords, etc? Yes  Adequate lighting in your home to reduce risk of falls? Yes   ASSISTIVE DEVICES UTILIZED TO PREVENT FALLS:  Life alert? No  Use of a cane, walker or w/c? No  Grab bars in the bathroom? Yes  Shower chair or bench in shower? No  Elevated toilet seat or a handicapped toilet? No   TIMED UP AND GO:  Was the test performed? No .  Length of time to ambulate 10 feet: n/a sec.   Gait steady and fast without use of assistive device  Cognitive Function: Normal cognitive status assessed by direct observation by this Nurse Health Advisor. No abnormalities found.          Immunizations Immunization History  Administered Date(s) Administered   Fluad Quad(high Dose  65+) 02/20/2019, 04/20/2020   Influenza Split 05/21/2012   Influenza Whole 05/19/2010  Influenza, High Dose Seasonal PF 05/23/2018   Influenza,inj,Quad PF,6+ Mos 05/27/2014   PFIZER(Purple Top)SARS-COV-2 Vaccination 06/02/2019, 06/23/2019, 03/01/2020, 09/02/2020   Pfizer Covid-19 Vaccine Bivalent Booster 49yrs & up 02/13/2021   Pneumococcal Conjugate-13 11/06/2018   Pneumococcal Polysaccharide-23 11/17/2020   Tdap 01/04/2011    TDAP status: Due, Education has been provided regarding the importance of this vaccine. Advised may receive this vaccine at local pharmacy or Health Dept. Aware to provide a copy of the vaccination record if obtained from local pharmacy or Health Dept. Verbalized acceptance and understanding.  Flu Vaccine status: Up to date  Pneumococcal vaccine status: Up to date  Covid-19 vaccine status: Completed vaccines  Qualifies for Shingles Vaccine? Yes   Zostavax completed No   Shingrix Completed?: No.    Education has been provided regarding the importance of this vaccine. Patient has been advised to call insurance company to determine out of pocket expense if they have not yet received this vaccine. Advised may also receive vaccine at local pharmacy or Health Dept. Verbalized acceptance and understanding.  Screening Tests Health Maintenance  Topic Date Due   Zoster Vaccines- Shingrix (1 of 2) Never done   TETANUS/TDAP  01/03/2021   COLONOSCOPY (Pts 45-90yrs Insurance coverage will need to be confirmed)  07/12/2021   Pneumonia Vaccine 66+ Years old  Completed   INFLUENZA VACCINE  Completed   COVID-19 Vaccine  Completed   Hepatitis C Screening  Completed   HPV VACCINES  Aged Out    Health Maintenance  Health Maintenance Due  Topic Date Due   Zoster Vaccines- Shingrix (1 of 2) Never done   TETANUS/TDAP  01/03/2021    Colorectal cancer screening: Type of screening: Colonoscopy. Completed 07/12/2016. Repeat every 5 years  Lung Cancer Screening: (Low Dose CT  Chest recommended if Age 3-80 years, 30 pack-year currently smoking OR have quit w/in 15years.) does not qualify.   Lung Cancer Screening Referral: no  Additional Screening:  Hepatitis C Screening: does qualify; Completed yes  Vision Screening: Recommended annual ophthalmology exams for early detection of glaucoma and other disorders of the eye. Is the patient up to date with their annual eye exam?  Yes  Who is the provider or what is the name of the office in which the patient attends annual eye exams? [facility] If pt is not established with a provider, would they like to be referred to a provider to establish care? No .   Dental Screening: Recommended annual dental exams for proper oral hygiene  Community Resource Referral / Chronic Care Management: CRR required this visit?  No   CCM required this visit?  No      Plan:     I have personally reviewed and noted the following in the patients chart:   Medical and social history Use of alcohol, tobacco or illicit drugs  Current medications and supplements including opioid prescriptions. Patient is not currently taking opioid prescriptions. Functional ability and status Nutritional status Physical activity Advanced directives List of other physicians Hospitalizations, surgeries, and ER visits in previous 12 months Vitals Screenings to include cognitive, depression, and falls Referrals and appointments  In addition, I have reviewed and discussed with patient certain preventive protocols, quality metrics, and best practice recommendations. A written personalized care plan for preventive services as well as general preventive health recommendations were provided to patient.     Sheral Flow, LPN   6/31/4970   Nurse Notes:  Patient is cogitatively intact. There were no vitals filed for  this visit. There is no height or weight on file to calculate BMI. Patient stated that he has no issues with gait or balance;  does not use any assistive devices. Medications reviewed with patient; no opioid use noted.

## 2021-07-21 ENCOUNTER — Telehealth: Payer: Medicare Other | Admitting: Family Medicine

## 2021-07-21 DIAGNOSIS — U071 COVID-19: Secondary | ICD-10-CM

## 2021-07-21 NOTE — Progress Notes (Signed)
? ?  [place]  ? ?Needs VV for covid  ?

## 2021-07-23 DIAGNOSIS — U071 COVID-19: Secondary | ICD-10-CM | POA: Diagnosis not present

## 2021-07-24 ENCOUNTER — Telehealth (INDEPENDENT_AMBULATORY_CARE_PROVIDER_SITE_OTHER): Payer: Medicare Other | Admitting: Internal Medicine

## 2021-07-24 ENCOUNTER — Encounter: Payer: Self-pay | Admitting: Internal Medicine

## 2021-07-24 DIAGNOSIS — U071 COVID-19: Secondary | ICD-10-CM | POA: Insufficient documentation

## 2021-07-24 NOTE — Progress Notes (Signed)
Virtual Visit via Video Note ? ?I connected with Nicholas Paul on 07/24/21 at  3:40 PM EDT by a video enabled telemedicine application and verified that I am speaking with the correct person using two identifiers. ?  ?I discussed the limitations of evaluation and management by telemedicine and the availability of in person appointments. The patient expressed understanding and agreed to proceed. ? ?Present for the visit:  Myself, Dr Billey Gosling, [name].  The patient is currently at home and I am in the office.   ? ?No referring provider.  ? ? ?History of Present Illness: ?This is an acute visit for covid.  His  symptoms started last Wednesday with congestion.   ?  ?He turned positive positive last Friday.  He states worsening congestion, low-grade fevers, sinus pain mostly behind his eyes, cough at night when he is laying down from drainage, some lower back pain and mild intermittent headaches.  He has been taking NyQuil and some over-the-counter medications as needed.  He overall feels okay and thinks some symptoms will probably improve over the next few days.  He was not sure when he would be able to return to work. ?  ?  ? ?Review of Systems  ?Constitutional:  Positive for fever (low grade).  ?HENT:  Positive for congestion and sinus pain. Negative for ear pain and sore throat.   ?Respiratory:  Positive for cough (at night when laying down - PND). Negative for sputum production, shortness of breath and wheezing.   ?Musculoskeletal:  Positive for back pain.  ?Neurological:  Positive for headaches (mild, intermittent). Negative for dizziness.   ?  ? ?  ?  ?BP well controlled 120's/70's ? ? ?Social History  ? ?Socioeconomic History  ? Marital status: Married  ?  Spouse name: Not on file  ? Number of children: 2  ? Years of education: Not on file  ? Highest education level: Not on file  ?Occupational History  ?  Employer: [employer]  ?Tobacco Use  ? Smoking status: Never  ? Smokeless tobacco: Never   ?Vaping Use  ? Vaping Use: Never used  ?Substance and Sexual Activity  ? Alcohol use: Yes  ?  Comment: occasional  ? Drug use: Never  ? Sexual activity: Yes  ?  Comment: vasectomy  ?Other Topics Concern  ? Not on file  ?Social History Narrative  ? ** Merged History Encounter **  ?    ? ?Social Determinants of Health  ? ?Financial Resource Strain: Low Risk   ? Difficulty of Paying Living Expenses: Not hard at all  ?Food Insecurity: No Food Insecurity  ? Worried About Charity fundraiser in the Last Year: Never true  ? Ran Out of Food in the Last Year: Never true  ?Transportation Needs: No Transportation Needs  ? Lack of Transportation (Medical): No  ? Lack of Transportation (Non-Medical): No  ?Physical Activity: Sufficiently Active  ? Days of Exercise per Week: 5 days  ? Minutes of Exercise per Session: 30 min  ?Stress: No Stress Concern Present  ? Feeling of Stress : Not at all  ?Social Connections: Socially Integrated  ? Frequency of Communication with Friends and Family: More than three times a week  ? Frequency of Social Gatherings with Friends and Family: More than three times a week  ? Attends Religious Services: More than 4 times per year  ? Active Member of Clubs or Organizations: Yes  ? Attends Archivist Meetings: More than  4 times per year  ? Marital Status: Married  ? ?  ?Observations/Objective: ?Appears well in NAD ?Breathing normally, no coughing ?Skin appears warm and dry ? ?Assessment and Plan: ? ?See Problem List for Assessment and Plan of chronic medical problems. ? ? ?Follow Up Instructions: ? ?  ?I discussed the assessment and treatment plan with the patient. The patient was provided an opportunity to ask questions and all were answered. The patient agreed with the plan and demonstrated an understanding of the instructions. ?  ?The patient was advised to call back or seek an in-person evaluation if the symptoms worsen or if the condition fails to improve as anticipated. ? ? ? ?Binnie Rail, MD ? ?

## 2021-07-24 NOTE — Assessment & Plan Note (Addendum)
Acute ?Symptoms started 5 days ago and are mild in nature ?He has been treating them with over-the-counter cold/allergy medications and is doing well-starting to see improvement in his symptoms ?Discussed antiviral-we both feel he does not need to take this ?Continue over-the-counter symptomatic treatment, rest, fluids ?Discussed quarantine recommendations ?He will call with any questions or concerns. ?

## 2021-07-26 ENCOUNTER — Telehealth: Payer: Medicare Other | Admitting: Internal Medicine

## 2021-08-21 ENCOUNTER — Other Ambulatory Visit: Payer: Self-pay | Admitting: Cardiology

## 2021-10-02 ENCOUNTER — Encounter: Payer: Self-pay | Admitting: Internal Medicine

## 2021-10-12 ENCOUNTER — Telehealth: Payer: Self-pay

## 2021-10-12 NOTE — Chronic Care Management (AMB) (Unsigned)
Chronic Care Management Pharmacy Assistant   Name: Nicholas Paul  MRN: [id number] DOB: 1951/04/14   Reason for Encounter: Disease State-General    Recent office visits:  07/24/21 Nicholas Rail, MD-PCP Video visit (Covid) No orders or medication changes  Recent consult visits:  None in the last six months  Hospital visits:  Medication Reconciliation was completed by comparing discharge summary, patient's EMR and Pharmacy list, and upon discussion with patient.  Admitted to the hospital on 06/07/21 due to Suprapubic catheter. Discharge date was 06/07/21. Discharged from [facility] Intensive Care Unit.  Admitted to the hospital on 06/04/21 due to Acute cystitis with hematuria. Discharge date was 06/07/21. Discharged from [facility] Intensive Care Unit.  Medications that remain the same after Hospital Discharge:??  -All other medications will remain the same.    Medications: Outpatient Encounter Medications as of 10/12/2021  Medication Sig   amLODipine (NORVASC) 5 MG tablet Take 1 tablet (5 mg total) by mouth daily.   aspirin EC 81 MG tablet Take 81 mg by mouth daily.   atorvastatin (LIPITOR) 10 MG tablet Take 1 tablet (10 mg total) by mouth daily.   benazepril (LOTENSIN) 40 MG tablet Take 1 tablet (40 mg total) by mouth daily.   Cholecalciferol (VITAMIN D) 125 MCG (5000 UT) CAPS Take 125 mcg by mouth daily.    Coenzyme Q10 (CO Q 10 PO) Take 1 capsule by mouth daily.   diphenhydramine-acetaminophen (TYLENOL PM) 25-500 MG TABS tablet Take 1 tablet by mouth at bedtime as needed.   famotidine (PEPCID) 10 MG tablet Take 10 mg by mouth as needed for heartburn or indigestion.   hydrochlorothiazide (MICROZIDE) 12.5 MG capsule TAKE 1 CAPSULE BY MOUTH EVERY DAY   Naproxen Sod-diphenhydrAMINE (ALEVE PM) 220-25 MG TABS Take 1 tablet by mouth as needed.    Omega-3 Fatty Acids (OMEGA 3 PO) Take 1 capsule by mouth daily.    psyllium (METAMUCIL) 58.6 % packet Take 2 packets by mouth 2 (two) times daily.     No facility-administered encounter medications on file as of 10/12/2021.   Contacted Nicholas Paul for General Review Call   Chart Review:  Have there been any documented new, changed, or discontinued medications since last visit? No (If yes, include name, dose, frequency, date) Has there been any documented recent hospitalizations or ED visits since last visit with Clinical Pharmacist? Yes Brief Summary (including medication and/or Diagnosis changes):   Adherence Review:  Does the Clinical Pharmacist Assistant have access to adherence rates? Yes Adherence rates for STAR metric medications (List medication(s)/day supply/ last 2 fill dates). Adherence rates for medications indicated for disease state being reviewed (List medication(s)/day supply/ last 2 fill dates). Does the patient have >5 day gap between last estimated fill dates for any of the above medications or other medication gaps? No Reason for medication gaps.   Disease State Questions:  Able to connect with Patient? Yes  Did patient have any problems with their health recently? No Note problems and Concerns:  Have you had any admissions or emergency room visits or worsening of your condition(s) since last visit? No Details of ED visit, hospital visit and/or worsening condition(s):  Have you had any visits with new specialists or providers since your last visit? No Explain:  Have you had any new health care problem(s) since your last visit? No New problem(s) reported:  Have you run out of any of your medications since you last spoke with clinical pharmacist? No What caused you to run  out of your medications?  Are there any medications you are not taking as prescribed? No What kept you from taking your medications as prescribed?  Are you having any issues or side effects with your medications? No Note of issues or side effects:  Do you have any other health concerns or questions you want to discuss with your  Clinical Pharmacist before your next visit? No Note additional concerns and questions from Patient.  Are there any health concerns that you feel we can do a better job addressing? No Note Patient's response.  Are you having any problems with any of the following since the last visit: (select all that apply)  None  Details:  12. Any falls since last visit? No  Details:  13. Any increased or uncontrolled pain since last visit? No  Details:  14. Next visit Type: Does not believe he needs appt at this time     15. Additional Details? No    Care Gaps: Colonoscopy-07/12/16 Diabetic Foot Exam-NA Ophthalmology-NA Dexa Scan - NA Annual Well Visit - NA Micro albumin-NA Hemoglobin A1c- 11/17/21  Star Rating Drugs: Benazepril 40 mg-last fill 09/17/21 90 ds Atorvastatin 10 mg-last fill 09/09/21 90 ds  Ethelene Hal Clinical Pharmacist Assistant [phone]

## 2021-10-16 DIAGNOSIS — I8311 Varicose veins of right lower extremity with inflammation: Secondary | ICD-10-CM | POA: Diagnosis not present

## 2021-10-16 DIAGNOSIS — I872 Venous insufficiency (chronic) (peripheral): Secondary | ICD-10-CM | POA: Diagnosis not present

## 2021-10-16 DIAGNOSIS — I8312 Varicose veins of left lower extremity with inflammation: Secondary | ICD-10-CM | POA: Diagnosis not present

## 2021-10-16 DIAGNOSIS — L570 Actinic keratosis: Secondary | ICD-10-CM | POA: Diagnosis not present

## 2021-12-04 ENCOUNTER — Encounter: Payer: Self-pay | Admitting: Internal Medicine

## 2021-12-18 DIAGNOSIS — N401 Enlarged prostate with lower urinary tract symptoms: Secondary | ICD-10-CM | POA: Diagnosis not present

## 2021-12-21 ENCOUNTER — Encounter: Payer: Self-pay | Admitting: Internal Medicine

## 2021-12-21 NOTE — Progress Notes (Signed)
Subjective:    Patient ID: Nicholas Paul, male    DOB: 11-02-1950, 72 y.o.   MRN: [id number]     HPI Lyndol is here for follow up of his chronic medical problems, including htn, hld, hyperglycemia  Scheduled for colonoscopy  Exercises regularly - plays golf and walks a lot when he plays.    Few more cramps when taking the atorvastatin daily.    Still flying a couple of times a month.     Medications and allergies reviewed with patient and updated if appropriate.  Current Outpatient Medications on File Prior to Visit  Medication Sig Dispense Refill   amLODipine (NORVASC) 5 MG tablet Take 1 tablet (5 mg total) by mouth daily. 90 tablet 3   aspirin EC 81 MG tablet Take 81 mg by mouth daily.     atorvastatin (LIPITOR) 10 MG tablet Take 1 tablet (10 mg total) by mouth daily. 90 tablet 3   benazepril (LOTENSIN) 40 MG tablet Take 1 tablet (40 mg total) by mouth daily. 90 tablet 3   Cholecalciferol (VITAMIN D) 125 MCG (5000 UT) CAPS Take 125 mcg by mouth daily.      Coenzyme Q10 (CO Q 10 PO) Take 1 capsule by mouth daily.     diphenhydramine-acetaminophen (TYLENOL PM) 25-500 MG TABS tablet Take 1 tablet by mouth at bedtime as needed.     famotidine (PEPCID) 10 MG tablet Take 10 mg by mouth as needed for heartburn or indigestion.     hydrochlorothiazide (MICROZIDE) 12.5 MG capsule TAKE 1 CAPSULE BY MOUTH EVERY DAY 90 capsule 3   Naproxen Sod-diphenhydrAMINE (ALEVE PM) 220-25 MG TABS Take 1 tablet by mouth as needed.      Omega-3 Fatty Acids (OMEGA 3 PO) Take 1 capsule by mouth daily.      psyllium (METAMUCIL) 58.6 % packet Take 2 packets by mouth 2 (two) times daily.      No current facility-administered medications on file prior to visit.     Review of Systems  Constitutional:  Negative for fever.  Respiratory:  Negative for cough, shortness of breath and wheezing.   Cardiovascular:  Negative for chest pain, palpitations and leg swelling.  Gastrointestinal:  Negative  for abdominal pain, blood in stool, constipation, diarrhea and nausea.       No gerd  Musculoskeletal:  Negative for arthralgias and back pain.       Muscle cramping  Neurological:  Negative for light-headedness and headaches.       Objective:   Vitals:   12/22/21 0854  BP: 128/70  Pulse: 77  Temp: 98.1 F (36.7 C)  SpO2: 97%   BP Readings from Last 3 Encounters:  12/22/21 128/70  03/10/21 125/80  11/17/20 120/80   Wt Readings from Last 3 Encounters:  12/22/21 179 lb (81.2 kg)  03/10/21 180 lb (81.6 kg)  11/17/20 179 lb (81.2 kg)   Body mass index is 26.43 kg/m.    Physical Exam Constitutional:      General: He is not in acute distress.    Appearance: Normal appearance. He is not ill-appearing.  HENT:     Head: Normocephalic and atraumatic.  Eyes:     Conjunctiva/sclera: Conjunctivae normal.  Cardiovascular:     Rate and Rhythm: Normal rate and regular rhythm.     Heart sounds: Normal heart sounds. No murmur heard. Pulmonary:     Effort: Pulmonary effort is normal. No respiratory distress.     Breath sounds: Normal breath  sounds. No wheezing or rales.  Musculoskeletal:     Right lower leg: No edema.     Left lower leg: No edema.  Skin:    General: Skin is warm and dry.     Findings: No rash.  Neurological:     Mental Status: He is alert. Mental status is at baseline.  Psychiatric:        Mood and Affect: Mood normal.        Lab Results  Component Value Date   WBC 6.0 12/30/2019   HGB 15.8 12/30/2019   HCT 44.6 12/30/2019   PLT 226 12/30/2019   GLUCOSE 112 (H) 11/17/2020   CHOL 137 04/21/2021   TRIG 63 04/21/2021   HDL 57 04/21/2021   LDLCALC 67 04/21/2021   ALT 18 04/21/2021   AST 21 04/21/2021   NA 138 11/17/2020   K 4.9 11/17/2020   CL 103 11/17/2020   CREATININE 1.08 11/17/2020   BUN 15 11/17/2020   CO2 32 11/17/2020   TSH 0.69 11/06/2018   PSA 3.04 11/06/2018   HGBA1C 5.6 11/17/2020     Assessment & Plan:    See Problem List  for Assessment and Plan of chronic medical problems.

## 2021-12-21 NOTE — Patient Instructions (Addendum)
    Consider getting the tetanus vaccine and Shingrix vaccine for shingles at the pharmacy.   Blood work was ordered.     Medications changes include :   None      Return in about 1 year (around 12/23/2022) for follow up.

## 2021-12-22 ENCOUNTER — Ambulatory Visit (INDEPENDENT_AMBULATORY_CARE_PROVIDER_SITE_OTHER): Payer: Medicare Other | Admitting: Internal Medicine

## 2021-12-22 ENCOUNTER — Encounter: Payer: Self-pay | Admitting: Internal Medicine

## 2021-12-22 VITALS — BP 128/70 | HR 77 | Temp 98.1°F | Ht 69.0 in | Wt 179.0 lb

## 2021-12-22 DIAGNOSIS — R739 Hyperglycemia, unspecified: Secondary | ICD-10-CM

## 2021-12-22 DIAGNOSIS — I10 Essential (primary) hypertension: Secondary | ICD-10-CM

## 2021-12-22 DIAGNOSIS — E7849 Other hyperlipidemia: Secondary | ICD-10-CM

## 2021-12-22 LAB — LIPID PANEL
Cholesterol: 155 mg/dL (ref 0–200)
HDL: 60.3 mg/dL (ref >39.00)
LDL Cholesterol: 78 mg/dL (ref 0–99)
NonHDL: 94.86
Total CHOL/HDL Ratio: 3
Triglycerides: 86 mg/dL (ref 0.0–149.0)
VLDL: 17.2 mg/dL (ref 0.0–40.0)

## 2021-12-22 LAB — COMPREHENSIVE METABOLIC PANEL
ALT: 20 U/L (ref 0–53)
AST: 18 U/L (ref 0–37)
Albumin: 4.4 g/dL (ref 3.5–5.2)
Alkaline Phosphatase: 67 U/L (ref 39–117)
BUN: 15 mg/dL (ref 6–23)
CO2: 30 mEq/L (ref 19–32)
Calcium: 9.5 mg/dL (ref 8.4–10.5)
Chloride: 102 mEq/L (ref 96–112)
Creatinine, Ser: 1.01 mg/dL (ref 0.40–1.50)
GFR: 74.49 mL/min (ref >60.00)
Glucose, Bld: 105 mg/dL — ABNORMAL HIGH (ref 70–99)
Potassium: 4.6 mEq/L (ref 3.5–5.1)
Sodium: 137 mEq/L (ref 135–145)
Total Bilirubin: 1.2 mg/dL (ref 0.2–1.2)
Total Protein: 7.3 g/dL (ref 6.0–8.3)

## 2021-12-22 LAB — CBC WITH DIFFERENTIAL/PLATELET
Basophils Absolute: 0.1 10*3/uL (ref 0.0–0.1)
Basophils Relative: 0.8 % (ref 0.0–3.0)
Eosinophils Absolute: 0.1 10*3/uL (ref 0.0–0.7)
Eosinophils Relative: 1.2 % (ref 0.0–5.0)
HCT: 42.9 % (ref 39.0–52.0)
Hemoglobin: 15.1 g/dL (ref 13.0–17.0)
Lymphocytes Relative: 23.6 % (ref 12.0–46.0)
Lymphs Abs: 1.6 10*3/uL (ref 0.7–4.0)
MCHC: 35.3 g/dL (ref 30.0–36.0)
MCV: 88.4 fl (ref 78.0–100.0)
Monocytes Absolute: 0.6 10*3/uL (ref 0.1–1.0)
Monocytes Relative: 8 % (ref 3.0–12.0)
Neutro Abs: 4.6 10*3/uL (ref 1.4–7.7)
Neutrophils Relative %: 66.4 % (ref 43.0–77.0)
Platelets: 217 10*3/uL (ref 150.0–400.0)
RBC: 4.85 Mil/uL (ref 4.22–5.81)
RDW: 12.6 % (ref 11.5–15.5)
WBC: 7 10*3/uL (ref 4.0–10.5)

## 2021-12-22 LAB — HEMOGLOBIN A1C: Hgb A1c MFr Bld: 5.9 % (ref 4.6–6.5)

## 2021-12-22 NOTE — Assessment & Plan Note (Signed)
Chronic Blood pressure well controlled CMP Continue amlodipine 5 mg daily, benazepril 40 mg daily, HCTZ 12.5 mg daily

## 2021-12-22 NOTE — Assessment & Plan Note (Signed)
Chronic Check lipid panel  Continue atorvastatin 10 mg daily - is having more muscle cramping with daily dosing - will try increasing his CO-Q10 dose Regular exercise and healthy diet encouraged

## 2021-12-22 NOTE — Assessment & Plan Note (Signed)
Chronic Check a1c Low sugar / carb diet Stressed regular exercise  

## 2021-12-25 ENCOUNTER — Ambulatory Visit: Payer: Medicare Other

## 2021-12-25 VITALS — Ht 69.0 in | Wt 180.0 lb

## 2021-12-25 DIAGNOSIS — N5201 Erectile dysfunction due to arterial insufficiency: Secondary | ICD-10-CM | POA: Diagnosis not present

## 2021-12-25 DIAGNOSIS — Z86010 Personal history of colonic polyps: Secondary | ICD-10-CM

## 2021-12-25 DIAGNOSIS — N400 Enlarged prostate without lower urinary tract symptoms: Secondary | ICD-10-CM | POA: Diagnosis not present

## 2021-12-25 NOTE — Progress Notes (Signed)
No egg or soy allergy known to patient  No issues known to pt with past sedation with any surgeries or procedures Patient denies ever being told they had issues or difficulty with intubation  No FH of Malignant Hyperthermia Pt is not on diet pills Pt is not on  home 02  Pt is not on blood thinners  Pt denies issues with constipation  No A fib or A flutter Have any cardiac testing pending--denied Pt instructed to use [email or web address] or GoodRx for a price reduction on prep   

## 2022-01-21 NOTE — Progress Notes (Unsigned)
[facility] History and Physical   Primary Care Physician:  Binnie Rail, MD   Reason for Procedure:   Hx colon polyps  Plan:    colonoscopy     HPI: Nicholas Paul is a 71 y.o. male for polyp surveillance exam. Chronic intermittent blood on TP or itching from known hemorrhoids - not changed.   3 adenomas 12 mm max in 2006  colonoscopy 2011 :negative 07/12/2016 diminutive ascending polyp removed adenoma - recall 2023  Past Medical History:  Diagnosis Date   BPH (benign prostatic hyperplasia)    urologist--- dr Junious Silk   Coronary artery disease cardiologist--- Cecille Rubin gerhardt NP   last CTA 02-17-2018  nonobstructive proximal and mid LAD, calcium score 15;   nuclear stress test 06-09-2003 in epic,  no ischemia w/ ef 59%   GERD (gastroesophageal reflux disease)    H/O: rheumatic fever    noted after tonsillectomy   Hyperlipidemia    Hypertension    followed by cardiology   IBS (irritable bowel syndrome)    Dr Carlean Purl   Internal hemorrhoids    Pulmonary nodule    4 mm RML nodule on CT scan in 12/12. Needs follow up in one year.       Past Surgical History:  Procedure Laterality Date   COLONOSCOPY W/ POLYPECTOMY  2006   internal hemorrhoids 2011   SHOULDER ARTHROSCOPY WITH OPEN ROTATOR CUFF REPAIR Right 2022   THULIUM LASER TURP (TRANSURETHRAL RESECTION OF PROSTATE) N/A 07/14/2019   Procedure: THULIUM LASER TURP (TRANSURETHRAL RESECTION OF PROSTATE);  Surgeon: Festus Aloe, MD;  Location: [facility];  Service: Urology;  Laterality: N/A;   TONSILLECTOMY AND ADENOIDECTOMY      Prior to Admission medications   Medication Sig Start Date End Date Taking? Authorizing Provider  amLODipine (NORVASC) 5 MG tablet Take 1 tablet (5 mg total) by mouth daily. 04/25/21   Lelon Perla, MD  aspirin EC 81 MG tablet Take 81 mg by mouth daily.    [provider]  atorvastatin (LIPITOR) 10 MG tablet Take 1 tablet (10 mg total) by mouth  daily. 03/10/21   Lelon Perla, MD  benazepril (LOTENSIN) 40 MG tablet Take 1 tablet (40 mg total) by mouth daily. 06/15/21   Lelon Perla, MD  Cholecalciferol (VITAMIN D) 125 MCG (5000 UT) CAPS Take 125 mcg by mouth daily.  08/25/18   [provider]  Coenzyme Q10 (CO Q 10 PO) Take 1 capsule by mouth daily.    [provider]  diphenhydramine-acetaminophen (TYLENOL PM) 25-500 MG TABS tablet Take 1 tablet by mouth at bedtime as needed.    [provider]  famotidine (PEPCID) 10 MG tablet Take 10 mg by mouth as needed for heartburn or indigestion.    [provider]  hydrochlorothiazide (MICROZIDE) 12.5 MG capsule TAKE 1 CAPSULE BY MOUTH EVERY DAY 08/21/21   Lelon Perla, MD  Naproxen Sod-diphenhydrAMINE (ALEVE PM) 220-25 MG TABS Take 1 tablet by mouth as needed.     [provider]  Omega-3 Fatty Acids (OMEGA 3 PO) Take 1 capsule by mouth daily.     [provider]  psyllium (METAMUCIL) 58.6 % packet Take 2 packets by mouth 2 (two) times daily.     [provider]    Current Outpatient Medications  Medication Sig Dispense Refill   amLODipine (NORVASC) 5 MG tablet Take 1 tablet (5 mg total) by mouth daily. 90 tablet 3   aspirin EC 81 MG tablet  Take 81 mg by mouth daily.     atorvastatin (LIPITOR) 10 MG tablet Take 1 tablet (10 mg total) by mouth daily. 90 tablet 3   benazepril (LOTENSIN) 40 MG tablet Take 1 tablet (40 mg total) by mouth daily. 90 tablet 3   Cholecalciferol (VITAMIN D) 125 MCG (5000 UT) CAPS Take 125 mcg by mouth daily.      Coenzyme Q10 (CO Q 10 PO) Take 1 capsule by mouth daily.     diphenhydramine-acetaminophen (TYLENOL PM) 25-500 MG TABS tablet Take 1 tablet by mouth at bedtime as needed.     hydrochlorothiazide (MICROZIDE) 12.5 MG capsule TAKE 1 CAPSULE BY MOUTH EVERY DAY 90 capsule 3   Omega-3 Fatty Acids (OMEGA 3 PO) Take 1 capsule by mouth daily.      famotidine (PEPCID) 10 MG tablet Take 10 mg by  mouth as needed for heartburn or indigestion.     Naproxen Sod-diphenhydrAMINE (ALEVE PM) 220-25 MG TABS Take 1 tablet by mouth as needed.      psyllium (METAMUCIL) 58.6 % packet Take 2 packets by mouth 2 (two) times daily.      Current Facility-Administered Medications  Medication Dose Route Frequency Provider Last Rate Last Admin   0.9 %  sodium chloride infusion  500 mL Intravenous Once Gatha Mayer, MD        Allergies as of 01/22/2022 - Review Complete 01/22/2022  Allergen Reaction Noted   Doxazosin mesylate Swelling    Crestor [rosuvastatin]  08/30/2014    Family History  Problem Relation Age of Onset   Hyperlipidemia Mother    Hypertension Mother    Cancer Father        bladder cancer   Cancer Brother        bladder cancer   Hypertension Brother    Cancer Paternal Aunt        bladder cancer   Stroke Neg Hx    Diabetes Neg Hx    Heart attack Neg Hx    Colon cancer Neg Hx    Colon polyps Neg Hx    Esophageal cancer Neg Hx    Rectal cancer Neg Hx    Stomach cancer Neg Hx     Social History   Socioeconomic History   Marital status: Married    Spouse name: Not on file   Number of children: 2   Years of education: Not on file   Highest education level: Not on file  Occupational History    Employer: [employer]  Tobacco Use   Smoking status: Never   Smokeless tobacco: Never  Vaping Use   Vaping Use: Never used  Substance and Sexual Activity   Alcohol use: Yes    Comment: occasional   Drug use: Never   Sexual activity: Yes    Comment: vasectomy  Other Topics Concern   Not on file  Social History Narrative   ** Merged History Encounter **       Social Determinants of Health   Financial Resource Strain: Low Risk  (07/10/2021)   Overall Financial Resource Strain (CARDIA)    Difficulty of Paying Living Expenses: Not hard at all  Food Insecurity: No Food Insecurity (07/10/2021)   Hunger Vital Sign    Worried About Running Out of Food in the Last  Year: Never true    Ran Out of Food in the Last Year: Never true  Transportation Needs: No Transportation Needs (07/10/2021)   PRAPARE - Transportation    Lack of Transportation (  Medical): No    Lack of Transportation (Non-Medical): No  Physical Activity: Sufficiently Active (07/10/2021)   Exercise Vital Sign    Days of Exercise per Week: 5 days    Minutes of Exercise per Session: 30 min  Stress: No Stress Concern Present (07/10/2021)   [place]    Feeling of Stress : Not at all  Social Connections: [place] (07/10/2021)   Social Connection and Isolation Panel [NHANES]    Frequency of Communication with Friends and Family: More than three times a week    Frequency of Social Gatherings with Friends and Family: More than three times a week    Attends Religious Services: More than 4 times per year    Active Member of [employer] or Organizations: Yes    Attends Music therapist: More than 4 times per year    Marital Status: Married  Human resources officer Violence: Not At Risk (07/10/2021)   Humiliation, Afraid, Rape, and Kick questionnaire    Fear of Current or Ex-Partner: No    Emotionally Abused: No    Physically Abused: No    Sexually Abused: No    Review of Systems:   All other review of systems negative except as mentioned in the HPI.  Physical Exam: Vital signs BP 125/78   Pulse 74   Temp 97.8 F (36.6 C)   Ht '5\' 9"'$  (1.753 m)   Wt 180 lb (81.6 kg)   SpO2 99%   BMI 26.58 kg/m   General:   Alert,  Well-developed, well-nourished, pleasant and cooperative in NAD Lungs:  Clear throughout to auscultation.   Heart:  Regular rate and rhythm; no murmurs, clicks, rubs,  or gallops. Abdomen:  Soft, nontender and nondistended. Normal bowel sounds.   Neuro/Psych:  Alert and cooperative. Normal mood and affect. A and O x 3   '@Amber Guthridge'$  Simonne Maffucci, MD, [facility] [phone]  (pager) 01/22/2022 9:26 AM@

## 2022-01-22 ENCOUNTER — Encounter: Payer: Self-pay | Admitting: Internal Medicine

## 2022-01-22 ENCOUNTER — Ambulatory Visit (AMBULATORY_SURGERY_CENTER): Payer: Medicare Other | Admitting: Internal Medicine

## 2022-01-22 VITALS — BP 120/68 | HR 59 | Temp 97.8°F | Resp 16 | Ht 69.0 in | Wt 180.0 lb

## 2022-01-22 DIAGNOSIS — Z86010 Personal history of colonic polyps: Secondary | ICD-10-CM | POA: Diagnosis not present

## 2022-01-22 DIAGNOSIS — D122 Benign neoplasm of ascending colon: Secondary | ICD-10-CM | POA: Diagnosis not present

## 2022-01-22 DIAGNOSIS — Z09 Encounter for follow-up examination after completed treatment for conditions other than malignant neoplasm: Secondary | ICD-10-CM | POA: Diagnosis not present

## 2022-01-22 MED ORDER — SODIUM CHLORIDE 0.9 % IV SOLN: 500.0000 mL | Freq: Once | INTRAVENOUS | Status: DC

## 2022-01-22 NOTE — Op Note (Signed)
[place] Patient Name: Nicholas Paul Procedure Date: 01/22/2022 9:23 AM MRN: [id number] Endoscopist: Gatha Mayer , MD Age: 71 Referring MD:  Date of Birth: 1950-05-23 Gender: Male Account #: [id number] Procedure:                Colonoscopy Indications:              Surveillance: Personal history of adenomatous                            polyps on last colonoscopy 5 years ago, Last                            colonoscopy: 2018 Medicines:                Monitored Anesthesia Care Procedure:                Pre-Anesthesia Assessment:                           - Prior to the procedure, a History and Physical                            was performed, and patient medications and                            allergies were reviewed. The patient's tolerance of                            previous anesthesia was also reviewed. The risks                            and benefits of the procedure and the sedation                            options and risks were discussed with the patient.                            All questions were answered, and informed consent                            was obtained. Prior Anticoagulants: The patient has                            taken no previous anticoagulant or antiplatelet                            agents. ASA Grade Assessment: II - A patient with                            mild systemic disease. After reviewing the risks                            and benefits, the patient was deemed in  satisfactory condition to undergo the procedure.                           After obtaining informed consent, the colonoscope                            was passed under direct vision. Throughout the                            procedure, the patient's blood pressure, pulse, and                            oxygen saturations were monitored continuously. The                            Olympus PCF-H190DL [id number]) Colonoscope was                             introduced through the anus and advanced to the the                            cecum, identified by appendiceal orifice and                            ileocecal valve. The colonoscopy was performed                            without difficulty. The patient tolerated the                            procedure well. The quality of the bowel                            preparation was good. The ileocecal valve,                            appendiceal orifice, and rectum were photographed.                            The bowel preparation used was Miralax via split                            dose instruction. Scope In: 9:29:48 AM Scope Out: 9:49:43 AM Scope Withdrawal Time: 0 hours 16 minutes 8 seconds  Total Procedure Duration: 0 hours 19 minutes 55 seconds  Findings:                 The digital rectal exam findings include enlarged                            prostate. Pertinent negatives include no palpable                            rectal lesions.  Two sessile polyps were found in the ascending                            colon. The polyps were diminutive in size. These                            polyps were removed with a cold snare. Resection                            and retrieval were complete. Verification of                            patient identification for the specimen was done.                            Estimated blood loss was minimal.                           The exam was otherwise without abnormality on                            direct and retroflexion views. Complications:            No immediate complications. Estimated Blood Loss:     Estimated blood loss was minimal. Impression:               - Enlarged prostate found on digital rectal exam.                           - Two diminutive polyps in the ascending colon,                            removed with a cold snare. Resected and retrieved.                           - The  examination was otherwise normal on direct                            and retroflexion views.                           - Personal history of colonic polyps. 3 adenomas 12                            mm max in 2006                           colonoscopy 2011 :negative                           07/12/2016 diminutive ascending polyp removed adenoma Recommendation:           - Patient has a contact number available for                            emergencies. The  signs and symptoms of potential                            delayed complications were discussed with the                            patient. Return to normal activities tomorrow.                            Written discharge instructions were provided to the                            patient.                           - Resume previous diet.                           - Continue present medications.                           - Await pathology results.                           - No recommendation at this time regarding repeat                            colonoscopy due to age. Gatha Mayer, MD 01/22/2022 9:59:45 AM This report has been signed electronically.

## 2022-01-22 NOTE — Progress Notes (Signed)
Called to room to assist during endoscopic procedure.  Patient ID and intended procedure confirmed with present staff. Received instructions for my participation in the procedure from the performing physician.  

## 2022-01-22 NOTE — Progress Notes (Signed)
Pt's states no medical or surgical changes since previsit or office visit. 

## 2022-01-22 NOTE — Progress Notes (Signed)
Sedate, gd SR, tolerated procedure well, VSS, report to RN 

## 2022-01-22 NOTE — Patient Instructions (Addendum)
Handout on polyps given to you today   YOU HAD AN ENDOSCOPIC PROCEDURE TODAY AT THE [facility]:   Refer to the procedure report that was given to you for any specific questions about what was found during the examination.  If the procedure report does not answer your questions, please call your gastroenterologist to clarify.  If you requested that your care partner not be given the details of your procedure findings, then the procedure report has been included in a sealed envelope for you to review at your convenience later.  YOU SHOULD EXPECT: Some feelings of bloating in the abdomen. Passage of more gas than usual.  Walking can help get rid of the air that was put into your GI tract during the procedure and reduce the bloating. If you had a lower endoscopy (such as a colonoscopy or flexible sigmoidoscopy) you may notice spotting of blood in your stool or on the toilet paper. If you underwent a bowel prep for your procedure, you may not have a normal bowel movement for a few days.  Please Note:  You might notice some irritation and congestion in your nose or some drainage.  This is from the oxygen used during your procedure.  There is no need for concern and it should clear up in a day or so.  SYMPTOMS TO REPORT IMMEDIATELY:  Following lower endoscopy (colonoscopy or flexible sigmoidoscopy):  Excessive amounts of blood in the stool  Significant tenderness or worsening of abdominal pains  Swelling of the abdomen that is new, acute  Fever of 100F or higher  For urgent or emergent issues, a gastroenterologist can be reached at any hour by calling [phone]. Do not use MyChart messaging for urgent concerns.    DIET:  We do recommend a small meal at first, but then you may proceed to your regular diet.  Drink plenty of fluids but you should avoid alcoholic beverages for 24 hours.  ACTIVITY:  You should plan to take it easy for the rest of today and you should NOT DRIVE or use  heavy machinery until tomorrow (because of the sedation medicines used during the test).    FOLLOW UP: Our staff will call the number listed on your records the next business day following your procedure.  We will call around 7:15- 8:00 am to check on you and address any questions or concerns that you may have regarding the information given to you following your procedure. If we do not reach you, we will leave a message.     If any biopsies were taken you will be contacted by phone or by letter within the next 1-3 weeks.  Please call us at [phone] if you have not heard about the biopsies in 3 weeks.    SIGNATURES/CONFIDENTIALITY: You and/or your care partner have signed paperwork which will be entered into your electronic medical record.  These signatures attest to the fact that that the information above on your After Visit Summary has been reviewed and is understood.  Full responsibility of the confidentiality of this discharge information lies with you and/or your care-partner.  

## 2022-01-23 ENCOUNTER — Telehealth: Payer: Self-pay | Admitting: *Deleted

## 2022-01-23 NOTE — Telephone Encounter (Signed)
  Follow up Call-     01/22/2022    8:47 AM  Call back number  Post procedure Call Back phone  # [phone]  Permission to leave phone message Yes     Patient questions:  Do you have a fever, pain , or abdominal swelling? No. Pain Score  0 *  Have you tolerated food without any problems? Yes.    Have you been able to return to your normal activities? Yes.    Do you have any questions about your discharge instructions: Diet   No. Medications  No. Follow up visit  No.  Do you have questions or concerns about your Care? No.  Actions: * If pain score is 4 or above: No action needed, pain <4.

## 2022-01-25 ENCOUNTER — Encounter: Payer: Self-pay | Admitting: Internal Medicine

## 2022-02-09 DIAGNOSIS — Z23 Encounter for immunization: Secondary | ICD-10-CM | POA: Diagnosis not present

## 2022-02-28 DIAGNOSIS — L570 Actinic keratosis: Secondary | ICD-10-CM | POA: Diagnosis not present

## 2022-02-28 DIAGNOSIS — D225 Melanocytic nevi of trunk: Secondary | ICD-10-CM | POA: Diagnosis not present

## 2022-02-28 DIAGNOSIS — D2262 Melanocytic nevi of left upper limb, including shoulder: Secondary | ICD-10-CM | POA: Diagnosis not present

## 2022-02-28 DIAGNOSIS — Z85828 Personal history of other malignant neoplasm of skin: Secondary | ICD-10-CM | POA: Diagnosis not present

## 2022-02-28 DIAGNOSIS — C44729 Squamous cell carcinoma of skin of left lower limb, including hip: Secondary | ICD-10-CM | POA: Diagnosis not present

## 2022-02-28 DIAGNOSIS — D2271 Melanocytic nevi of right lower limb, including hip: Secondary | ICD-10-CM | POA: Diagnosis not present

## 2022-02-28 DIAGNOSIS — D2261 Melanocytic nevi of right upper limb, including shoulder: Secondary | ICD-10-CM | POA: Diagnosis not present

## 2022-02-28 DIAGNOSIS — L821 Other seborrheic keratosis: Secondary | ICD-10-CM | POA: Diagnosis not present

## 2022-02-28 DIAGNOSIS — D2272 Melanocytic nevi of left lower limb, including hip: Secondary | ICD-10-CM | POA: Diagnosis not present

## 2022-02-28 DIAGNOSIS — L814 Other melanin hyperpigmentation: Secondary | ICD-10-CM | POA: Diagnosis not present

## 2022-03-07 ENCOUNTER — Other Ambulatory Visit: Payer: Self-pay | Admitting: Cardiology

## 2022-03-07 DIAGNOSIS — E7849 Other hyperlipidemia: Secondary | ICD-10-CM

## 2022-03-07 DIAGNOSIS — I2510 Atherosclerotic heart disease of native coronary artery without angina pectoris: Secondary | ICD-10-CM

## 2022-03-08 ENCOUNTER — Encounter: Payer: Self-pay | Admitting: Nurse Practitioner

## 2022-03-08 ENCOUNTER — Ambulatory Visit: Payer: Medicare Other | Attending: Nurse Practitioner | Admitting: Nurse Practitioner

## 2022-03-08 VITALS — BP 116/78 | HR 70 | Ht 69.0 in | Wt 179.0 lb

## 2022-03-08 DIAGNOSIS — E785 Hyperlipidemia, unspecified: Secondary | ICD-10-CM | POA: Insufficient documentation

## 2022-03-08 DIAGNOSIS — I2510 Atherosclerotic heart disease of native coronary artery without angina pectoris: Secondary | ICD-10-CM | POA: Insufficient documentation

## 2022-03-08 DIAGNOSIS — I10 Essential (primary) hypertension: Secondary | ICD-10-CM | POA: Insufficient documentation

## 2022-03-08 NOTE — Patient Instructions (Signed)
Medication Instructions:  Your physician recommends that you continue on your current medications as directed. Please refer to the Current Medication list given to you today.   *If you need a refill on your cardiac medications before your next appointment, please call your pharmacy*   Lab Work: NONE ordered at this time of appointment   If you have labs (blood work) drawn today and your tests are completely normal, you will receive your results only by: [place] (if you have MyChart) OR A paper copy in the mail If you have any lab test that is abnormal or we need to change your treatment, we will call you to review the results.   Testing/Procedures: NONE ordered at this time of appointment     Follow-Up: At [facility], you and your health needs are our priority.  As part of our continuing mission to provide you with exceptional heart care, we have created designated Provider Care Teams.  These Care Teams include your primary Cardiologist (physician) and Advanced Practice Providers (APPs -  Physician Assistants and Nurse Practitioners) who all work together to provide you with the care you need, when you need it.  We recommend signing up for the patient portal called "MyChart".  Sign up information is provided on this After Visit Summary.  MyChart is used to connect with patients for Virtual Visits (Telemedicine).  Patients are able to view lab/test results, encounter notes, upcoming appointments, etc.  Non-urgent messages can be sent to your provider as well.   To learn more about what you can do with MyChart, go to [email or web address].    Your next appointment:   1 year(s)  The format for your next appointment:   In Person  Provider:   Kirk Ruths, MD     Other Instructions   Important Information About Sugar

## 2022-03-08 NOTE — Progress Notes (Signed)
Office Visit    Patient Name: Nicholas Paul Date of Encounter: 03/08/2022  Primary Care Provider:  Binnie Rail, MD Primary Cardiologist:  Kirk Ruths, MD  Chief Complaint    71 year old male with a history of CAD, hypertension, hyperlipidemia, IBS, GERD, and BPH who presents for follow-up related to CAD.  Past Medical History    Past Medical History:  Diagnosis Date   BPH (benign prostatic hyperplasia)    urologist--- dr Junious Silk   Coronary artery disease cardiologist--- Cecille Rubin gerhardt NP   last CTA 02-17-2018  nonobstructive proximal and mid LAD, calcium score 15;   nuclear stress test 06-09-2003 in epic,  no ischemia w/ ef 59%   GERD (gastroesophageal reflux disease)    H/O: rheumatic fever    noted after tonsillectomy   Hyperlipidemia    Hypertension    followed by cardiology   IBS (irritable bowel syndrome)    Dr Carlean Purl   Internal hemorrhoids    Pulmonary nodule    4 mm RML nodule on CT scan in 12/12. Needs follow up in one year.      Past Surgical History:  Procedure Laterality Date   COLONOSCOPY W/ POLYPECTOMY  2006   internal hemorrhoids 2011   SHOULDER ARTHROSCOPY WITH OPEN ROTATOR CUFF REPAIR Right 2022   THULIUM LASER TURP (TRANSURETHRAL RESECTION OF PROSTATE) N/A 07/14/2019   Procedure: THULIUM LASER TURP (TRANSURETHRAL RESECTION OF PROSTATE);  Surgeon: Festus Aloe, MD;  Location: [facility];  Service: Urology;  Laterality: N/A;   TONSILLECTOMY AND ADENOIDECTOMY      Allergies  Allergies  Allergen Reactions   Doxazosin Mesylate Swelling    REACTION: edema on the generic (07-09-2019  Per pt this reaction was approx. 1990s).    Crestor [Rosuvastatin]     Crestor 5 mg qd caused leg myalgias ; no issue with Lipitor 10 mg M, W, & F    History of Present Illness    71 year old male with a history of CAD, hypertension, hyperlipidemia, IBS, GERD, and BPH.  Echocardiogram in 2013 showed normal LV function, mild LVH, G1 DD.   Coronary CT angiogram in 2019 showed mixed plaque in the proximal to mid LAD without significant stenosis, coronary calcium score of 15.  He was last seen in the office on 03/10/2021 and was stable from a cardiac standpoint.  He denies any symptoms concerning for angina.  BP was well controlled.  He does have a history of myalgias with statin therapy. At his last visit his Lipitor was increased to 10 mg daily.   He presents today for follow-up.  Since his last visit he has been stable from a cardiac standpoint. He is tolerating his daily Lipitor. He is very active and exercises regularly. He denies any symptoms concerning for angina. Overall he reports feeling well.   Home Medications    Current Outpatient Medications  Medication Sig Dispense Refill   amLODipine (NORVASC) 5 MG tablet Take 1 tablet (5 mg total) by mouth daily. 90 tablet 3   aspirin EC 81 MG tablet Take 81 mg by mouth daily.     atorvastatin (LIPITOR) 10 MG tablet TAKE 1 TABLET BY MOUTH EVERY DAY 90 tablet 3   benazepril (LOTENSIN) 40 MG tablet Take 1 tablet (40 mg total) by mouth daily. 90 tablet 3   Cholecalciferol (VITAMIN D) 125 MCG (5000 UT) CAPS Take 125 mcg by mouth daily.      Coenzyme Q10 (CO Q 10 PO) Take 1 capsule by mouth daily.  diphenhydramine-acetaminophen (TYLENOL PM) 25-500 MG TABS tablet Take 1 tablet by mouth at bedtime as needed.     famotidine (PEPCID) 10 MG tablet Take 10 mg by mouth as needed for heartburn or indigestion.     hydrochlorothiazide (MICROZIDE) 12.5 MG capsule TAKE 1 CAPSULE BY MOUTH EVERY DAY 90 capsule 3   Naproxen Sod-diphenhydrAMINE (ALEVE PM) 220-25 MG TABS Take 1 tablet by mouth as needed.      Omega-3 Fatty Acids (OMEGA 3 PO) Take 1 capsule by mouth daily.      psyllium (METAMUCIL) 58.6 % packet Take 2 packets by mouth 2 (two) times daily.      No current facility-administered medications for this visit.     Review of Systems    He denies chest pain, palpitations, dyspnea, pnd,  orthopnea, n, v, dizziness, syncope, edema, weight gain, or early satiety. All other systems reviewed and are otherwise negative except as noted above.  Physical Exam    VS:  BP 116/78 (BP Location: Left Arm, Patient Position: Sitting)   Pulse 70   Ht '5\' 9"'$  (1.753 m)   Wt 179 lb (81.2 kg)   SpO2 97%   BMI 26.43 kg/m   GEN: Well nourished, well developed, in no acute distress. HEENT: normal. Neck: Supple, no JVD, carotid bruits, or masses. Cardiac: RRR, no murmurs, rubs, or gallops. No clubbing, cyanosis, edema.  Radials/DP/PT 2+ and equal bilaterally.  Respiratory:  Respirations regular and unlabored, clear to auscultation bilaterally. GI: Soft, nontender, nondistended, BS + x 4. MS: no deformity or atrophy. Skin: warm and dry, no rash. Neuro:  Strength and sensation are intact. Psych: Normal affect.  Accessory Clinical Findings    ECG personally reviewed by me today -sinus rhythm, 64 bpm, sinus arrhythmia- no acute changes.   Lab Results  Component Value Date   WBC 7.0 12/22/2021   HGB 15.1 12/22/2021   HCT 42.9 12/22/2021   MCV 88.4 12/22/2021   PLT 217.0 12/22/2021   Lab Results  Component Value Date   CREATININE 1.01 12/22/2021   BUN 15 12/22/2021   NA 137 12/22/2021   K 4.6 12/22/2021   CL 102 12/22/2021   CO2 30 12/22/2021   Lab Results  Component Value Date   ALT 20 12/22/2021   AST 18 12/22/2021   ALKPHOS 67 12/22/2021   BILITOT 1.2 12/22/2021   Lab Results  Component Value Date   CHOL 155 12/22/2021   HDL 60.30 12/22/2021   LDLCALC 78 12/22/2021   TRIG 86.0 12/22/2021   CHOLHDL 3 12/22/2021    Lab Results  Component Value Date   HGBA1C 5.9 12/22/2021    Assessment & Plan    1. CAD: Coronary CT angiogram in 2019 showed mixed plaque in the proximal to mid LAD without significant stenosis, coronary calcium score of 15.  Stable with no anginal symptoms. No indication for ischemic evaluation. Continue aspirin, amlodipine, hydrochlorothiazide, and  Lipitor.   2. Hypertension: BP well controlled. Continue current antihypertensive regimen.   3. Hyperlipidemia: LDL was 78 in 12/2021. Was at goal prior. Encouraged ongoing lifestyle modifications with diet and exercise. Continue ASA, Lipitor.   4. Disposition: Follow-up in 1 year.       Lenna Sciara, NP 03/08/2022, 12:20 PM

## 2022-04-08 DIAGNOSIS — Z23 Encounter for immunization: Secondary | ICD-10-CM | POA: Diagnosis not present

## 2022-04-16 ENCOUNTER — Other Ambulatory Visit: Payer: Self-pay | Admitting: Cardiology

## 2022-05-15 ENCOUNTER — Other Ambulatory Visit: Payer: Medicare Other

## 2022-06-13 ENCOUNTER — Other Ambulatory Visit: Payer: Self-pay | Admitting: Cardiology

## 2022-06-13 DIAGNOSIS — I10 Essential (primary) hypertension: Secondary | ICD-10-CM

## 2022-07-18 ENCOUNTER — Ambulatory Visit (INDEPENDENT_AMBULATORY_CARE_PROVIDER_SITE_OTHER): Payer: Medicare Other

## 2022-07-18 VITALS — Ht 69.0 in | Wt 178.0 lb

## 2022-07-18 DIAGNOSIS — Z0000 Encounter for general adult medical examination without abnormal findings: Secondary | ICD-10-CM | POA: Diagnosis not present

## 2022-07-18 NOTE — Patient Instructions (Addendum)
Mr. Reuther , Thank you for taking time to come for your Medicare Wellness Visit. I appreciate your ongoing commitment to your health goals. Please review the following plan we discussed and let me know if I can assist you in the future.   These are the goals we discussed:  Goals      Client understands the importance of follow-up with providers by attending scheduled visits     My goal is to stay healthy and well.     Pharmacy Care Plan     CARE PLAN ENTRY  Current Barriers:  Chronic Disease Management support, education, and care coordination needs related to HTN and HLD  Pharmacist Clinical Goal(s):  Over the next 180 days, patient will maintain adherence to medications and lifestyle to prevent ASCVD and other health complications  Interventions: Comprehensive medication review performed. Discussed BP and cholesterol goals, importance of exercise and diet to maintain current health status  Patient Self Care Activities:  Self administers medications as prescribed, Calls pharmacy for medication refills, and Calls provider office for new concerns or questions  Initial goal documentation        This is a list of the screening recommended for you and due dates:  Health Maintenance  Topic Date Due   Zoster (Shingles) Vaccine (1 of 2) Never done   DTaP/Tdap/Td vaccine (2 - Td or Tdap) 01/03/2021   COVID-19 Vaccine (7 - 2023-24 season) 04/06/2022   Medicare Annual Wellness Visit  07/18/2023   Colon Cancer Screening  01/23/2027   Pneumonia Vaccine  Completed   Flu Shot  Completed   Hepatitis C Screening: USPSTF Recommendation to screen - Ages 18-79 yo.  Completed   HPV Vaccine  Aged Out    Advanced directives: No  Conditions/risks identified: Yes  Next appointment: Follow up in one year for your annual wellness visit.   Preventive Care 36 Years and Older, Male  Preventive care refers to lifestyle choices and visits with your health care provider that can promote health  and wellness. What does preventive care include? A yearly physical exam. This is also called an annual well check. Dental exams once or twice a year. Routine eye exams. Ask your health care provider how often you should have your eyes checked. Personal lifestyle choices, including: Daily care of your teeth and gums. Regular physical activity. Eating a healthy diet. Avoiding tobacco and drug use. Limiting alcohol use. Practicing safe sex. Taking low doses of aspirin every day. Taking vitamin and mineral supplements as recommended by your health care provider. What happens during an annual well check? The services and screenings done by your health care provider during your annual well check will depend on your age, overall health, lifestyle risk factors, and family history of disease. Counseling  Your health care provider may ask you questions about your: Alcohol use. Tobacco use. Drug use. Emotional well-being. Home and relationship well-being. Sexual activity. Eating habits. History of falls. Memory and ability to understand (cognition). Work and work Statistician. Screening  You may have the following tests or measurements: Height, weight, and BMI. Blood pressure. Lipid and cholesterol levels. These may be checked every 5 years, or more frequently if you are over 73 years old. Skin check. Lung cancer screening. You may have this screening every year starting at age 73 if you have a 30-pack-year history of smoking and currently smoke or have quit within the past 15 years. Fecal occult blood test (FOBT) of the stool. You may have this test every year  starting at age 73. Flexible sigmoidoscopy or colonoscopy. You may have a sigmoidoscopy every 5 years or a colonoscopy every 10 years starting at age 73. Prostate cancer screening. Recommendations will vary depending on your family history and other risks. Hepatitis C blood test. Hepatitis B blood test. Sexually transmitted disease  (STD) testing. Diabetes screening. This is done by checking your blood sugar (glucose) after you have not eaten for a while (fasting). You may have this done every 1-3 years. Abdominal aortic aneurysm (AAA) screening. You may need this if you are a current or former smoker. Osteoporosis. You may be screened starting at age 73 if you are at high risk. Talk with your health care provider about your test results, treatment options, and if necessary, the need for more tests. Vaccines  Your health care provider may recommend certain vaccines, such as: Influenza vaccine. This is recommended every year. Tetanus, diphtheria, and acellular pertussis (Tdap, Td) vaccine. You may need a Td booster every 10 years. Zoster vaccine. You may need this after age 73. Pneumococcal 13-valent conjugate (PCV13) vaccine. One dose is recommended after age 73. Pneumococcal polysaccharide (PPSV23) vaccine. One dose is recommended after age 73. Talk to your health care provider about which screenings and vaccines you need and how often you need them. This information is not intended to replace advice given to you by your health care provider. Make sure you discuss any questions you have with your health care provider. Document Released: 05/27/2015 Document Revised: 01/18/2016 Document Reviewed: 03/01/2015 Elsevier Interactive Patient Education  2017 [place] Prevention in the Home Falls can cause injuries. They can happen to people of all ages. There are many things you can do to make your home safe and to help prevent falls. What can I do on the outside of my home? Regularly fix the edges of walkways and driveways and fix any cracks. Remove anything that might make you trip as you walk through a door, such as a raised step or threshold. Trim any bushes or trees on the path to your home. Use bright outdoor lighting. Clear any walking paths of anything that might make someone trip, such as rocks or  tools. Regularly check to see if handrails are loose or broken. Make sure that both sides of any steps have handrails. Any raised decks and porches should have guardrails on the edges. Have any leaves, snow, or ice cleared regularly. Use sand or salt on walking paths during winter. Clean up any spills in your garage right away. This includes oil or grease spills. What can I do in the bathroom? Use night lights. Install grab bars by the toilet and in the tub and shower. Do not use towel bars as grab bars. Use non-skid mats or decals in the tub or shower. If you need to sit down in the shower, use a plastic, non-slip stool. Keep the floor dry. Clean up any water that spills on the floor as soon as it happens. Remove soap buildup in the tub or shower regularly. Attach bath mats securely with double-sided non-slip rug tape. Do not have throw rugs and other things on the floor that can make you trip. What can I do in the bedroom? Use night lights. Make sure that you have a light by your bed that is easy to reach. Do not use any sheets or blankets that are too big for your bed. They should not hang down onto the floor. Have a firm chair that has side arms.  You can use this for support while you get dressed. Do not have throw rugs and other things on the floor that can make you trip. What can I do in the kitchen? Clean up any spills right away. Avoid walking on wet floors. Keep items that you use a lot in easy-to-reach places. If you need to reach something above you, use a strong step stool that has a grab bar. Keep electrical cords out of the way. Do not use floor polish or wax that makes floors slippery. If you must use wax, use non-skid floor wax. Do not have throw rugs and other things on the floor that can make you trip. What can I do with my stairs? Do not leave any items on the stairs. Make sure that there are handrails on both sides of the stairs and use them. Fix handrails that are  broken or loose. Make sure that handrails are as long as the stairways. Check any carpeting to make sure that it is firmly attached to the stairs. Fix any carpet that is loose or worn. Avoid having throw rugs at the top or bottom of the stairs. If you do have throw rugs, attach them to the floor with carpet tape. Make sure that you have a light switch at the top of the stairs and the bottom of the stairs. If you do not have them, ask someone to add them for you. What else can I do to help prevent falls? Wear shoes that: Do not have high heels. Have rubber bottoms. Are comfortable and fit you well. Are closed at the toe. Do not wear sandals. If you use a stepladder: Make sure that it is fully opened. Do not climb a closed stepladder. Make sure that both sides of the stepladder are locked into place. Ask someone to hold it for you, if possible. Clearly mark and make sure that you can see: Any grab bars or handrails. First and last steps. Where the edge of each step is. Use tools that help you move around (mobility aids) if they are needed. These include: Canes. Walkers. Scooters. Crutches. Turn on the lights when you go into a dark area. Replace any light bulbs as soon as they burn out. Set up your furniture so you have a clear path. Avoid moving your furniture around. If any of your floors are uneven, fix them. If there are any pets around you, be aware of where they are. Review your medicines with your doctor. Some medicines can make you feel dizzy. This can increase your chance of falling. Ask your doctor what other things that you can do to help prevent falls. This information is not intended to replace advice given to you by your health care provider. Make sure you discuss any questions you have with your health care provider. Document Released: 02/24/2009 Document Revised: 10/06/2015 Document Reviewed: 06/04/2014 Elsevier Interactive Patient Education  2017 [employer].

## 2022-07-18 NOTE — Progress Notes (Signed)
I connected with  Nicholas Paul on 07/18/2022 at 8:45 a.m. EST by telephone and verified that I am speaking with the correct person using two identifiers.  Location: Patient: Home Provider: [place] Persons participating in the virtual visit: [place]   I discussed the limitations, risks, security and privacy concerns of performing an evaluation and management service by telephone and the availability of in person appointments. The patient expressed understanding and agreed to proceed.  Interactive audio and video telecommunications were attempted between this nurse and patient, however failed, due to patient having technical difficulties OR patient did not have access to video capability.  We continued and completed visit with audio only.  Some vital signs may be absent or patient reported.   Sheral Flow, LPN  Subjective:   Nicholas Paul is a 72 y.o. male who presents for Medicare Annual/Subsequent preventive examination.  Review of Systems     Cardiac Risk Factors include: advanced age (>74mn, >>52women);dyslipidemia;family history of premature cardiovascular disease;male gender;hypertension     Objective:    Today's Vitals   07/18/22 0848  Weight: 178 lb (80.7 kg)  Height: '5\' 9"'$  (1.753 m)  PainSc: 0-No pain   Body mass index is 26.29 kg/m.     07/18/2022    8:50 AM 07/10/2021    9:28 AM 07/14/2019   10:56 AM 06/28/2016    8:46 AM  Advanced Directives  Does Patient Have a Medical Advance Directive? No No No Yes  Type of AScientist, research (medical)Living will  Would patient like information on creating a medical advance directive? No - Patient declined No - Patient declined No - Patient declined     Current Medications (verified) Outpatient Encounter Medications as of 07/18/2022  Medication Sig   amLODipine (NORVASC) 5 MG tablet TAKE 1 TABLET (5 MG TOTAL) BY MOUTH DAILY.   aspirin EC 81 MG tablet Take 81 mg by  mouth daily.   atorvastatin (LIPITOR) 10 MG tablet TAKE 1 TABLET BY MOUTH EVERY DAY   benazepril (LOTENSIN) 40 MG tablet TAKE 1 TABLET BY MOUTH EVERY DAY   Cholecalciferol (VITAMIN D) 125 MCG (5000 UT) CAPS Take 125 mcg by mouth daily.    Coenzyme Q10 (CO Q 10 PO) Take 1 capsule by mouth daily.   diphenhydramine-acetaminophen (TYLENOL PM) 25-500 MG TABS tablet Take 1 tablet by mouth at bedtime as needed.   famotidine (PEPCID) 10 MG tablet Take 10 mg by mouth as needed for heartburn or indigestion.   hydrochlorothiazide (MICROZIDE) 12.5 MG capsule TAKE 1 CAPSULE BY MOUTH EVERY DAY   Naproxen Sod-diphenhydrAMINE (ALEVE PM) 220-25 MG TABS Take 1 tablet by mouth as needed.    Omega-3 Fatty Acids (OMEGA 3 PO) Take 1 capsule by mouth daily.    psyllium (METAMUCIL) 58.6 % packet Take 2 packets by mouth 2 (two) times daily.    No facility-administered encounter medications on file as of 07/18/2022.    Allergies (verified) Doxazosin mesylate and Crestor [rosuvastatin]   History: Past Medical History:  Diagnosis Date   BPH (benign prostatic hyperplasia)    urologist--- dr eJunious Silk  Coronary artery disease cardiologist--- lCecille Rubingerhardt NP   last CTA 02-17-2018  nonobstructive proximal and mid LAD, calcium score 15;   nuclear stress test 06-09-2003 in epic,  no ischemia w/ ef 59%   GERD (gastroesophageal reflux disease)    H/O: rheumatic fever    noted after tonsillectomy   Hyperlipidemia    Hypertension  followed by cardiology   IBS (irritable bowel syndrome)    Dr Carlean Purl   Internal hemorrhoids    Pulmonary nodule    4 mm RML nodule on CT scan in 12/12. Needs follow up in one year.      Past Surgical History:  Procedure Laterality Date   COLONOSCOPY W/ POLYPECTOMY  2006   internal hemorrhoids 2011   SHOULDER ARTHROSCOPY WITH OPEN ROTATOR CUFF REPAIR Right 2022   THULIUM LASER TURP (TRANSURETHRAL RESECTION OF PROSTATE) N/A 07/14/2019   Procedure: THULIUM LASER TURP (TRANSURETHRAL  RESECTION OF PROSTATE);  Surgeon: Festus Aloe, MD;  Location: [facility];  Service: Urology;  Laterality: N/A;   TONSILLECTOMY AND ADENOIDECTOMY     Family History  Problem Relation Age of Onset   Hyperlipidemia Mother    Hypertension Mother    Cancer Father        bladder cancer   Cancer Brother        bladder cancer   Hypertension Brother    Cancer Paternal Aunt        bladder cancer   Stroke Neg Hx    Diabetes Neg Hx    Heart attack Neg Hx    Colon cancer Neg Hx    Colon polyps Neg Hx    Esophageal cancer Neg Hx    Rectal cancer Neg Hx    Stomach cancer Neg Hx    Social History   Socioeconomic History   Marital status: Married    Spouse name: Not on file   Number of children: 2   Years of education: Not on file   Highest education level: Not on file  Occupational History    Employer: [employer]  Tobacco Use   Smoking status: Never   Smokeless tobacco: Never  Vaping Use   Vaping Use: Never used  Substance and Sexual Activity   Alcohol use: Yes    Comment: occasional   Drug use: Never   Sexual activity: Yes    Comment: vasectomy  Other Topics Concern   Not on file  Social History Narrative   ** Merged History Encounter **       Social Determinants of Health   Financial Resource Strain: High Risk (07/18/2022)   Overall Financial Resource Strain (CARDIA)    Difficulty of Paying Living Expenses: Very hard  Food Insecurity: No Food Insecurity (07/18/2022)   Hunger Vital Sign    Worried About Running Out of Food in the Last Year: Never true    Ran Out of Food in the Last Year: Never true  Transportation Needs: No Transportation Needs (07/18/2022)   PRAPARE - Hydrologist (Medical): No    Lack of Transportation (Non-Medical): No  Physical Activity: Sufficiently Active (07/18/2022)   Exercise Vital Sign    Days of Exercise per Week: 7 days    Minutes of Exercise per Session: 60 min  Stress: No Stress Concern  Present (07/18/2022)   Felt    Feeling of Stress : Not at all  Social Connections: [place] (07/18/2022)   Social Connection and Isolation Panel [NHANES]    Frequency of Communication with Friends and Family: More than three times a week    Frequency of Social Gatherings with Friends and Family: More than three times a week    Attends Religious Services: More than 4 times per year    Active Member of [employer] or Organizations: Yes  Attends Music therapist: More than 4 times per year    Marital Status: Married    Tobacco Counseling Counseling given: Not Answered   Clinical Intake:  Pre-visit preparation completed: Yes  Pain : No/denies pain Pain Score: 0-No pain     BMI - recorded: 26.69 Nutritional Status: BMI 25 -29 Overweight Nutritional Risks: None Diabetes: No  How often do you need to have someone help you when you read instructions, pamphlets, or other written materials from your doctor or pharmacy?: 1 - Never What is the last grade level you completed in school?: HSG  Diabetic? No  Interpreter Needed?: No  Information entered by :: Lisette Abu, LPN.   Activities of Daily Living    07/18/2022    8:54 AM  In your present state of health, do you have any difficulty performing the following activities:  Hearing? 0  Vision? 0  Difficulty concentrating or making decisions? 0  Walking or climbing stairs? 0  Dressing or bathing? 0  Doing errands, shopping? 0  Preparing Food and eating ? N  Using the Toilet? N  In the past six months, have you accidently leaked urine? N  Do you have problems with loss of bowel control? N  Managing your Medications? N  Managing your Finances? N  Housekeeping or managing your Housekeeping? N    Patient Care Team: Binnie Rail, MD as PCP - General (Internal Medicine) Stanford Breed Denice Bors, MD as PCP - Cardiology  (Cardiology) Kerry Fort., MD as Consulting Physician (Urology) [facility] as Consulting Physician (Optometry)  Indicate any recent Medical Services you may have received from other than [facility] providers in the past year (date may be approximate).     Assessment:   This is a routine wellness examination for [place].  Hearing/Vision screen Hearing Screening - Comments:: Patient wears hearing aids. Vision Screening - Comments:: Wears rx glasses - up to date with routine eye exams with [facility].   Dietary issues and exercise activities discussed: Current Exercise Habits: Home exercise routine;Structured exercise class, Type of exercise: walking;treadmill;stretching;strength training/weights;exercise ball;calisthenics;Other - see comments (golfing, skiing,. goes to gym and walking), Time (Minutes): 60, Frequency (Times/Week): 7, Weekly Exercise (Minutes/Week): 420, Intensity: Moderate, Exercise limited by: None identified   Goals Addressed   None   Depression Screen    07/18/2022    8:53 AM 12/22/2021    8:59 AM 07/10/2021    9:43 AM 11/17/2020    8:34 AM 11/06/2018   11:26 AM 09/01/2015    8:11 AM  PHQ 2/9 Scores  PHQ - 2 Score 0 0 0 0 0 0  PHQ- 9 Score 0 0        Fall Risk    07/18/2022    8:51 AM 12/22/2021    8:59 AM 07/10/2021    9:29 AM 11/17/2020    8:30 AM 11/06/2018   11:25 AM  Fall Risk   Falls in the past year? 0 0 0 1 0  Number falls in past yr: 0 0 0 0 0  Injury with Fall? 0 0 0 1   Risk for fall due to : No Fall Risks No Fall Risks No Fall Risks No Fall Risks   Follow up Falls prevention discussed Falls evaluation completed Falls evaluation completed Falls evaluation completed     FALL RISK PREVENTION PERTAINING TO THE HOME:  Any stairs in or around the home? Yes  If so, are there any without handrails? No  Home free of loose throw rugs in walkways, pet beds, electrical cords, etc? Yes  Adequate lighting in your home to reduce risk of  falls? Yes   ASSISTIVE DEVICES UTILIZED TO PREVENT FALLS:  Life alert? No  Use of a cane, walker or w/c? No  Grab bars in the bathroom? Yes  Shower chair or bench in shower? No  Elevated toilet seat or a handicapped toilet? No   TIMED UP AND GO:  Was the test performed? No . Telephonic Visit  Cognitive Function:        07/18/2022    8:54 AM  6CIT Screen  What Year? 0 points  What month? 0 points  What time? 0 points  Count back from 20 0 points  Months in reverse 0 points  Repeat phrase 0 points  Total Score 0 points    Immunizations Immunization History  Administered Date(s) Administered   Fluad Quad(high Dose 65+) 02/20/2019, 04/20/2020, 04/08/2022   Influenza Split 05/21/2012   Influenza Whole 05/19/2010   Influenza, High Dose Seasonal PF 05/23/2018   Influenza,inj,Quad PF,6+ Mos 05/27/2014   PFIZER Comirnaty(Gray Top)Covid-19 Tri-Sucrose Vaccine 02/09/2022   PFIZER(Purple Top)SARS-COV-2 Vaccination 06/02/2019, 06/23/2019, 03/01/2020, 09/02/2020   Pfizer Covid-19 Vaccine Bivalent Booster 7yr & up 02/13/2021   Pneumococcal Conjugate-13 11/06/2018   Pneumococcal Polysaccharide-23 11/17/2020   Tdap 01/04/2011    TDAP status: Due, Education has been provided regarding the importance of this vaccine. Advised may receive this vaccine at local pharmacy or Health Dept. Aware to provide a copy of the vaccination record if obtained from local pharmacy or Health Dept. Verbalized acceptance and understanding.  Flu Vaccine status: Up to date  Pneumococcal vaccine status: Up to date  Covid-19 vaccine status: Completed vaccines  Qualifies for Shingles Vaccine? Yes   Zostavax completed No   Shingrix Completed?: No.    Education has been provided regarding the importance of this vaccine. Patient has been advised to call insurance company to determine out of pocket expense if they have not yet received this vaccine. Advised may also receive vaccine at local pharmacy or Health  Dept. Verbalized acceptance and understanding.  Screening Tests Health Maintenance  Topic Date Due   Zoster Vaccines- Shingrix (1 of 2) Never done   DTaP/Tdap/Td (2 - Td or Tdap) 01/03/2021   COVID-19 Vaccine (7 - 2023-24 season) 04/06/2022   Medicare Annual Wellness (AWV)  07/18/2023   COLONOSCOPY (Pts 45-471yrInsurance coverage will need to be confirmed)  01/23/2027   Pneumonia Vaccine 6565Years old  Completed   INFLUENZA VACCINE  Completed   Hepatitis C Screening  Completed   HPV VACCINES  Aged Out    Health Maintenance  Health Maintenance Due  Topic Date Due   Zoster Vaccines- Shingrix (1 of 2) Never done   DTaP/Tdap/Td (2 - Td or Tdap) 01/03/2021   COVID-19 Vaccine (7 - 2023-24 season) 04/06/2022    Colorectal cancer screening: Type of screening: Colonoscopy. Completed 01/22/2022. Repeat every 5 years  Lung Cancer Screening: (Low Dose CT Chest recommended if Age 73-80ears, 30 pack-year currently smoking OR have quit w/in 15years.) does not qualify.   Lung Cancer Screening Referral: no  Additional Screening:  Hepatitis C Screening: does qualify; Completed 09/01/2015  Vision Screening: Recommended annual ophthalmology exams for early detection of glaucoma and other disorders of the eye. Is the patient up to date with their annual eye exam?  Yes  Who is the provider or what is the name of the office in which the patient  attends annual eye exams? [facility] If pt is not established with a provider, would they like to be referred to a provider to establish care? No .   Dental Screening: Recommended annual dental exams for proper oral hygiene  Community Resource Referral / Chronic Care Management: CRR required this visit?  No   CCM required this visit?  No      Plan:     I have personally reviewed and noted the following in the patient's chart:   Medical and social history Use of alcohol, tobacco or illicit drugs  Current medications and supplements  including opioid prescriptions. Patient is not currently taking opioid prescriptions. Functional ability and status Nutritional status Physical activity Advanced directives List of other physicians Hospitalizations, surgeries, and ER visits in previous 12 months Vitals Screenings to include cognitive, depression, and falls Referrals and appointments  In addition, I have reviewed and discussed with patient certain preventive protocols, quality metrics, and best practice recommendations. A written personalized care plan for preventive services as well as general preventive health recommendations were provided to patient.     Sheral Flow, LPN   [id number]   Nurse Notes:  Normal cognitive status assessed by direct observation by this Nurse Health Advisor. No abnormalities found.

## 2022-07-30 ENCOUNTER — Telehealth (INDEPENDENT_AMBULATORY_CARE_PROVIDER_SITE_OTHER): Payer: Medicare Other | Admitting: Internal Medicine

## 2022-07-30 ENCOUNTER — Encounter: Payer: Self-pay | Admitting: Internal Medicine

## 2022-07-30 DIAGNOSIS — J019 Acute sinusitis, unspecified: Secondary | ICD-10-CM | POA: Diagnosis not present

## 2022-07-30 MED ORDER — AMOXICILLIN-POT CLAVULANATE 875-125 MG PO TABS: 1.0000 | ORAL_TABLET | Freq: Two times a day (BID) | ORAL | 0 refills | Status: AC

## 2022-07-30 NOTE — Assessment & Plan Note (Signed)
Acute Likely bacterial  Start Augmentin 875-125 mg BID x 10 day otc cold medications Rest, fluid Call if no improvement  

## 2022-07-30 NOTE — Progress Notes (Signed)
Virtual Visit via Video Note  I connected with Nicholas Paul on 07/30/22 at  2:40 PM EDT by a video enabled telemedicine application and verified that I am speaking with the correct person using two identifiers.   I discussed the limitations of evaluation and management by telemedicine and the availability of in person appointments. The patient expressed understanding and agreed to proceed.  Present for the visit:  Myself, Dr Billey Gosling, [name].  The patient is currently at work and I am in the office.    No referring provider.    History of Present Illness: This is an acute visit for cold symptoms  Cold symptoms x 12 days ago and are not getting better. He states PND, sensitive upper teeth, headaches, cough with sputum production from his throat/upper chest and some achiness.  No fever.    Taking claritin, tylenol or advil and mucinex  Covid x 2 negative.    Review of Systems  Constitutional:  Negative for fever.       Appetite ok  HENT:  Positive for sinus pain (mild - frontal and behind eyes). Negative for congestion, ear pain and sore throat.        PND, teeth sensitive  Respiratory:  Positive for cough and sputum production. Negative for shortness of breath and wheezing.   Musculoskeletal:  Positive for myalgias.  Neurological:  Positive for headaches. Negative for dizziness.      Social History   Socioeconomic History   Marital status: Married    Spouse name: Not on file   Number of children: 2   Years of education: Not on file   Highest education level: Not on file  Occupational History    Employer: [employer]  Tobacco Use   Smoking status: Never   Smokeless tobacco: Never  Vaping Use   Vaping Use: Never used  Substance and Sexual Activity   Alcohol use: Yes    Comment: occasional   Drug use: Never   Sexual activity: Yes    Comment: vasectomy  Other Topics Concern   Not on file  Social History Narrative   ** Merged History Encounter **        Social Determinants of Health   Financial Resource Strain: High Risk (07/18/2022)   Overall Financial Resource Strain (CARDIA)    Difficulty of Paying Living Expenses: Very hard  Food Insecurity: No Food Insecurity (07/18/2022)   Hunger Vital Sign    Worried About Running Out of Food in the Last Year: Never true    Ran Out of Food in the Last Year: Never true  Transportation Needs: No Transportation Needs (07/18/2022)   PRAPARE - Hydrologist (Medical): No    Lack of Transportation (Non-Medical): No  Physical Activity: Sufficiently Active (07/18/2022)   Exercise Vital Sign    Days of Exercise per Week: 7 days    Minutes of Exercise per Session: 60 min  Stress: No Stress Concern Present (07/18/2022)   [place]    Feeling of Stress : Not at all  Social Connections: [place] (07/18/2022)   Social Connection and Isolation Panel [NHANES]    Frequency of Communication with Friends and Family: More than three times a week    Frequency of Social Gatherings with Friends and Family: More than three times a week    Attends Religious Services: More than 4 times per year    Active Member of Clubs or  Organizations: Yes    Attends Music therapist: More than 4 times per year    Marital Status: Married     Observations/Objective: Appears well in NAD Breathing is normal  Assessment and Plan:  See Problem List for Assessment and Plan of chronic medical problems.   Follow Up Instructions:    I discussed the assessment and treatment plan with the patient. The patient was provided an opportunity to ask questions and all were answered. The patient agreed with the plan and demonstrated an understanding of the instructions.   The patient was advised to call back or seek an in-person evaluation if the symptoms worsen or if the condition fails to improve as anticipated.    Binnie Rail, MD

## 2022-08-03 DIAGNOSIS — M25512 Pain in left shoulder: Secondary | ICD-10-CM | POA: Diagnosis not present

## 2022-08-25 ENCOUNTER — Other Ambulatory Visit: Payer: Self-pay | Admitting: Cardiology

## 2022-08-27 DIAGNOSIS — M25512 Pain in left shoulder: Secondary | ICD-10-CM | POA: Diagnosis not present

## 2022-08-31 DIAGNOSIS — R3912 Poor urinary stream: Secondary | ICD-10-CM | POA: Diagnosis not present

## 2022-08-31 DIAGNOSIS — R351 Nocturia: Secondary | ICD-10-CM | POA: Diagnosis not present

## 2022-08-31 DIAGNOSIS — N401 Enlarged prostate with lower urinary tract symptoms: Secondary | ICD-10-CM | POA: Diagnosis not present

## 2022-09-15 DIAGNOSIS — E663 Overweight: Secondary | ICD-10-CM | POA: Diagnosis not present

## 2022-09-15 DIAGNOSIS — J018 Other acute sinusitis: Secondary | ICD-10-CM | POA: Diagnosis not present

## 2022-09-15 DIAGNOSIS — I10 Essential (primary) hypertension: Secondary | ICD-10-CM | POA: Diagnosis not present

## 2022-09-15 DIAGNOSIS — Z6825 Body mass index (BMI) 25.0-25.9, adult: Secondary | ICD-10-CM | POA: Diagnosis not present

## 2022-09-15 DIAGNOSIS — G44201 Tension-type headache, unspecified, intractable: Secondary | ICD-10-CM | POA: Diagnosis not present

## 2022-09-19 ENCOUNTER — Ambulatory Visit: Payer: Medicare Other | Admitting: Internal Medicine

## 2022-09-24 DIAGNOSIS — R3914 Feeling of incomplete bladder emptying: Secondary | ICD-10-CM | POA: Diagnosis not present

## 2022-09-24 DIAGNOSIS — R351 Nocturia: Secondary | ICD-10-CM | POA: Diagnosis not present

## 2022-10-25 DIAGNOSIS — N320 Bladder-neck obstruction: Secondary | ICD-10-CM | POA: Diagnosis not present

## 2022-10-25 DIAGNOSIS — R3914 Feeling of incomplete bladder emptying: Secondary | ICD-10-CM | POA: Diagnosis not present

## 2022-10-25 DIAGNOSIS — N401 Enlarged prostate with lower urinary tract symptoms: Secondary | ICD-10-CM | POA: Diagnosis not present

## 2022-11-04 ENCOUNTER — Encounter: Payer: Self-pay | Admitting: Internal Medicine

## 2022-11-04 NOTE — Progress Notes (Unsigned)
Subjective:    Patient ID: Nicholas Paul, male    DOB: 1951/03/25, 72 y.o.   MRN: [id number]     HPI Jerardo is here for follow up of his chronic medical problems.  Got nagging HA in May - lasted 10 days.  [facility]  - gave him a steroid injection, Cefriaxone and rx for Augmentin.    Eyes still feel heavy and HA behind eyes.  He felt like his cognitive ability decreased - it has gotten better.  He feels like he has slowed mentally but not physically .  Exercising - golf, weights, walking   Medications and allergies reviewed with patient and updated if appropriate.  Current Outpatient Medications on File Prior to Visit  Medication Sig Dispense Refill   amLODipine (NORVASC) 5 MG tablet TAKE 1 TABLET (5 MG TOTAL) BY MOUTH DAILY. 90 tablet 3   aspirin EC 81 MG tablet Take 81 mg by mouth daily.     atorvastatin (LIPITOR) 10 MG tablet TAKE 1 TABLET BY MOUTH EVERY DAY 90 tablet 3   benazepril (LOTENSIN) 40 MG tablet TAKE 1 TABLET BY MOUTH EVERY DAY 90 tablet 3   Cholecalciferol (VITAMIN D) 125 MCG (5000 UT) CAPS Take 125 mcg by mouth daily.      Coenzyme Q10 (CO Q 10 PO) Take 1 capsule by mouth daily.     diphenhydramine-acetaminophen (TYLENOL PM) 25-500 MG TABS tablet Take 1 tablet by mouth at bedtime as needed.     famotidine (PEPCID) 10 MG tablet Take 10 mg by mouth as needed for heartburn or indigestion.     hydrochlorothiazide (MICROZIDE) 12.5 MG capsule TAKE 1 CAPSULE BY MOUTH EVERY DAY 90 capsule 2   Naproxen Sod-diphenhydrAMINE (ALEVE PM) 220-25 MG TABS Take 1 tablet by mouth as needed.      Omega-3 Fatty Acids (OMEGA 3 PO) Take 1 capsule by mouth daily.      psyllium (METAMUCIL) 58.6 % packet Take 2 packets by mouth 2 (two) times daily.      No current facility-administered medications on file prior to visit.     Review of Systems  Constitutional:  Negative for fever.  Respiratory:  Negative for cough, shortness of breath and wheezing.    Cardiovascular:  Negative for chest pain, palpitations and leg swelling.  Gastrointestinal:  Negative for abdominal pain, constipation, diarrhea and nausea.       No gerd  Musculoskeletal:  Positive for arthralgias.  Neurological:  Positive for headaches (occ mild). Negative for light-headedness.       Objective:   Vitals:   11/05/22 1258  BP: 120/70  Pulse: 74  Temp: 97.9 F (36.6 C)  SpO2: 98%   BP Readings from Last 3 Encounters:  11/05/22 120/70  03/08/22 116/78  01/22/22 120/68   Wt Readings from Last 3 Encounters:  11/05/22 168 lb (76.2 kg)  07/18/22 178 lb (80.7 kg)  03/08/22 179 lb (81.2 kg)   Body mass index is 24.81 kg/m.    Physical Exam Constitutional:      General: He is not in acute distress.    Appearance: Normal appearance. He is not ill-appearing.  HENT:     Head: Normocephalic and atraumatic.     Ears:     Comments: Right tear canal with minimal erythema, minimal moisture Eyes:     Conjunctiva/sclera: Conjunctivae normal.  Cardiovascular:     Rate and Rhythm: Normal rate and regular rhythm.     Heart sounds: Normal  heart sounds.  Pulmonary:     Effort: Pulmonary effort is normal. No respiratory distress.     Breath sounds: Normal breath sounds. No wheezing or rales.  Abdominal:     General: There is no distension.     Palpations: Abdomen is soft.     Tenderness: There is no abdominal tenderness. There is no guarding or rebound.  Musculoskeletal:     Right lower leg: No edema.     Left lower leg: No edema.  Skin:    General: Skin is warm and dry.     Findings: No rash.  Neurological:     Mental Status: He is alert. Mental status is at baseline.  Psychiatric:        Mood and Affect: Mood normal.        Lab Results  Component Value Date   WBC 7.0 12/22/2021   HGB 15.1 12/22/2021   HCT 42.9 12/22/2021   PLT 217.0 12/22/2021   GLUCOSE 105 (H) 12/22/2021   CHOL 155 12/22/2021   TRIG 86.0 12/22/2021   HDL 60.30 12/22/2021    LDLCALC 78 12/22/2021   ALT 20 12/22/2021   AST 18 12/22/2021   NA 137 12/22/2021   K 4.6 12/22/2021   CL 102 12/22/2021   CREATININE 1.01 12/22/2021   BUN 15 12/22/2021   CO2 30 12/22/2021   TSH 0.69 11/06/2018   PSA 3.04 11/06/2018   HGBA1C 5.9 12/22/2021     Assessment & Plan:    See Problem List for Assessment and Plan of chronic medical problems.

## 2022-11-04 NOTE — Patient Instructions (Addendum)
      Blood work was ordered.   The lab is on the first floor.    Medications changes include :   none    A referral was ordered and someone will call you to schedule an appointment.     Return in about 1 year (around 11/05/2023) for follow up.

## 2022-11-05 ENCOUNTER — Ambulatory Visit (INDEPENDENT_AMBULATORY_CARE_PROVIDER_SITE_OTHER): Payer: Medicare Other | Admitting: Internal Medicine

## 2022-11-05 ENCOUNTER — Encounter: Payer: Self-pay | Admitting: Internal Medicine

## 2022-11-05 VITALS — BP 120/70 | HR 74 | Temp 97.9°F | Ht 69.0 in | Wt 168.0 lb

## 2022-11-05 DIAGNOSIS — R7303 Prediabetes: Secondary | ICD-10-CM

## 2022-11-05 DIAGNOSIS — R252 Cramp and spasm: Secondary | ICD-10-CM | POA: Diagnosis not present

## 2022-11-05 DIAGNOSIS — E7849 Other hyperlipidemia: Secondary | ICD-10-CM

## 2022-11-05 DIAGNOSIS — I10 Essential (primary) hypertension: Secondary | ICD-10-CM | POA: Diagnosis not present

## 2022-11-05 LAB — COMPREHENSIVE METABOLIC PANEL
ALT: 20 U/L (ref 0–53)
AST: 23 U/L (ref 0–37)
Albumin: 4.4 g/dL (ref 3.5–5.2)
Alkaline Phosphatase: 71 U/L (ref 39–117)
BUN: 13 mg/dL (ref 6–23)
CO2: 30 mEq/L (ref 19–32)
Calcium: 9.5 mg/dL (ref 8.4–10.5)
Chloride: 101 mEq/L (ref 96–112)
Creatinine, Ser: 0.89 mg/dL (ref 0.40–1.50)
GFR: 85.31 mL/min (ref >60.00)
Glucose, Bld: 120 mg/dL — ABNORMAL HIGH (ref 70–99)
Potassium: 3.9 mEq/L (ref 3.5–5.1)
Sodium: 136 mEq/L (ref 135–145)
Total Bilirubin: 1.5 mg/dL — ABNORMAL HIGH (ref 0.2–1.2)
Total Protein: 7.5 g/dL (ref 6.0–8.3)

## 2022-11-05 LAB — CBC WITH DIFFERENTIAL/PLATELET
Basophils Absolute: 0.1 10*3/uL (ref 0.0–0.1)
Basophils Relative: 1 % (ref 0.0–3.0)
Eosinophils Absolute: 0 10*3/uL (ref 0.0–0.7)
Eosinophils Relative: 0.4 % (ref 0.0–5.0)
HCT: 41.2 % (ref 39.0–52.0)
Hemoglobin: 13.9 g/dL (ref 13.0–17.0)
Lymphocytes Relative: 25.8 % (ref 12.0–46.0)
Lymphs Abs: 1.7 10*3/uL (ref 0.7–4.0)
MCHC: 33.7 g/dL (ref 30.0–36.0)
MCV: 89.6 fl (ref 78.0–100.0)
Monocytes Absolute: 0.5 10*3/uL (ref 0.1–1.0)
Monocytes Relative: 7.8 % (ref 3.0–12.0)
Neutro Abs: 4.2 10*3/uL (ref 1.4–7.7)
Neutrophils Relative %: 65 % (ref 43.0–77.0)
Platelets: 248 10*3/uL (ref 150.0–400.0)
RBC: 4.6 Mil/uL (ref 4.22–5.81)
RDW: 13.1 % (ref 11.5–15.5)
WBC: 6.4 10*3/uL (ref 4.0–10.5)

## 2022-11-05 LAB — HEMOGLOBIN A1C: Hgb A1c MFr Bld: 5.5 % (ref 4.6–6.5)

## 2022-11-05 LAB — LIPID PANEL
Cholesterol: 140 mg/dL (ref 0–200)
HDL: 64.7 mg/dL (ref >39.00)
LDL Cholesterol: 64 mg/dL (ref 0–99)
NonHDL: 75.47
Total CHOL/HDL Ratio: 2
Triglycerides: 55 mg/dL (ref 0.0–149.0)
VLDL: 11 mg/dL (ref 0.0–40.0)

## 2022-11-05 NOTE — Assessment & Plan Note (Signed)
Chronic Blood pressure well controlled CMP, CBC Continue amlodipine 5 mg daily, benazepril 40 mg daily, HCTZ 12.5 mg daily

## 2022-11-05 NOTE — Assessment & Plan Note (Signed)
Chronic Check a1c Low sugar / carb diet Stressed regular exercise  

## 2022-11-05 NOTE — Assessment & Plan Note (Signed)
Chronic Check lipid panel, CMP Continue atorvastatin 10 mg daily Continue co-Q10 Regular exercise and healthy diet encouraged

## 2022-11-05 NOTE — Assessment & Plan Note (Signed)
At night  RLE > LLE Drinks a good amount of water

## 2022-11-23 DIAGNOSIS — M25561 Pain in right knee: Secondary | ICD-10-CM | POA: Diagnosis not present

## 2022-11-27 ENCOUNTER — Other Ambulatory Visit: Payer: Self-pay

## 2022-11-27 DIAGNOSIS — N401 Enlarged prostate with lower urinary tract symptoms: Secondary | ICD-10-CM | POA: Diagnosis not present

## 2022-11-27 DIAGNOSIS — R3912 Poor urinary stream: Secondary | ICD-10-CM | POA: Diagnosis not present

## 2022-11-27 DIAGNOSIS — N3289 Other specified disorders of bladder: Secondary | ICD-10-CM | POA: Diagnosis not present

## 2022-12-12 ENCOUNTER — Other Ambulatory Visit (HOSPITAL_COMMUNITY): Payer: Self-pay | Admitting: Urology

## 2022-12-12 DIAGNOSIS — C61 Malignant neoplasm of prostate: Secondary | ICD-10-CM

## 2022-12-24 DIAGNOSIS — R972 Elevated prostate specific antigen [PSA]: Secondary | ICD-10-CM | POA: Diagnosis not present

## 2022-12-28 ENCOUNTER — Encounter: Payer: Medicare Other | Admitting: Internal Medicine

## 2022-12-28 ENCOUNTER — Encounter (HOSPITAL_COMMUNITY)
Admission: RE | Admit: 2022-12-28 | Discharge: 2022-12-28 | Disposition: A | Discharge status: Home / Self Care | Payer: Medicare Other | Source: Ambulatory Visit | Attending: Urology | Admitting: Urology

## 2022-12-28 DIAGNOSIS — C61 Malignant neoplasm of prostate: Secondary | ICD-10-CM | POA: Diagnosis not present

## 2022-12-28 DIAGNOSIS — C7951 Secondary malignant neoplasm of bone: Secondary | ICD-10-CM | POA: Diagnosis not present

## 2022-12-28 MED ORDER — PIFLIFOLASTAT F 18 (PYLARIFY) INJECTION: 9.0000 | Freq: Once | INTRAVENOUS | Status: DC

## 2022-12-28 MED ORDER — PIFLIFOLASTAT F 18 (PYLARIFY) INJECTION
9.0000 | Freq: Once | INTRAVENOUS | Status: AC
  Administered 2022-12-28: 8.39 via INTRAVENOUS

## 2023-01-17 ENCOUNTER — Encounter: Payer: Self-pay | Admitting: Internal Medicine

## 2023-01-17 DIAGNOSIS — C7951 Secondary malignant neoplasm of bone: Secondary | ICD-10-CM | POA: Insufficient documentation

## 2023-01-17 DIAGNOSIS — Z9189 Other specified personal risk factors, not elsewhere classified: Secondary | ICD-10-CM | POA: Insufficient documentation

## 2023-01-17 DIAGNOSIS — C61 Malignant neoplasm of prostate: Secondary | ICD-10-CM

## 2023-01-17 NOTE — Assessment & Plan Note (Addendum)
Supportive baseline bone mineral density study can be ordered in the upcoming visit and then every 2 years calcium (1000-1200 mg daily from food and supplements) and vitamin D3 (1000 IU daily) Zometa (5 mg IV annually) for osteopenia (T-score between -1.0 and -2.5) on ADT after dental clearance. Recommend him to see his dentist.  Healthy diet to prevent diabetes Weight-bearing exercises (30 minutes per day) Limit alcohol consumption and avoid smoking He should continue to see his cardiology team to optimize his cardiac condition while on treatment for active prostate cancer

## 2023-01-17 NOTE — Assessment & Plan Note (Addendum)
72 y.o.man with h/o hypertension, hyperlipidemia, BPH referring here for metastatic prostate cancer.  Currently with mHSPC. Patient start darolutamide with relugolix. He has nonregional and regional lymph nodes, and 4 sites of bone metastases. He does fit into high volume disease per CHAARTED trial. PSA 12.4 with GG5 (Gleason 4+5=9). His PSA increased more than 3x in about a year. Overall the presentation is concerning for aggressive disease in nature and I agree with him already started APRI with ADT.  Fortunately, he is fairly asymptomatic.  He has excellent performance status despite his age.  I recommend addition of docetaxel as triplet therapy.  Discussed ARASENS trial. We over the data together today with his wife.  An international, phase 3 trial, we randomly assigned patients with metastatic, hormone-sensitive prostate cancer in a 1:1 ratio to receive darolutamide 600-mg twice daily) or matching placebo, both in combination with androgen-deprivation therapy and docetaxel. Bone metastases were well represented. NEJM  2022;386:1132-1142.  The overall survival at 4 years was 62.7% in the darolutamide group and 50.4% in the placebo group. M1 disease was well represented. We do not have data without docetaxel in this study. Time to CRPC was over 4 years with triplet therapy.  I explained to him what this means.  We discussed docetaxel chemotherapy.  Potential side effect including fatigue, neutropenia, febrile neutropenia, thrombocytopenia, anemia, alopecia, skin rash, loss of nail and potentially infection, hypersensitivity, fluid retention with edema, diarrhea, nausea, vomiting, mouth sores, liver enzyme elevation, neuropathy, muscle pain, joint pain, visual change and rarely severe allergic reaction, skin rash, severe infection resulting in sepsis, and potential death.  Discussed the importance of recognizing fever, signs of infection while on treatment. Proceed to ED for emergency evaluation in the  setting of fever, infection. Severe infection resulting in sepsis can be life threatening and result in death if not treated early.  ARPI can cause fatigue, and rarely rash, heart failure, ischemic heart disease in <5% from studies.  In patients undergoing ADT with prostate cancer, aggressive management of cardiovascular risk factors to decrease cardiac events is recommended including controlling hypertension, hyperlipidemia, prevent diabetes and regular exercises are recommended.  He is seeking second opinion at [facility]. I let him know I am more than happy to communicate with the faculty there regarding their opinion after his consultation.  He has appointment with our radiation oncologist Dr. Kathrynn Running.  I encouraged him to proceed with the consultation. We will communicate after his meeting.  For future, also recommend both somatic and germline genetic testing, including MMR, TMB as well. We can arrange lab appointment in future follow up appointment.  Referral to genetics made today.

## 2023-01-17 NOTE — Progress Notes (Signed)
[facility] CONSULT NOTE  Patient Care Team: [name], MD as PCP - General (Internal Medicine) Jens Som Madolyn Frieze, MD as PCP - Cardiology (Cardiology) Bebe Shaggy., MD as Consulting Physician (Urology) [facility] [place] as Consulting Physician (Optometry)  ASSESSMENT & PLAN:  Prostate cancer metastatic to bone [facility]) 72 y.o.man with h/o hypertension, hyperlipidemia, BPH referring here for metastatic prostate cancer.  Currently with mHSPC. Patient start darolutamide with relugolix. He has nonregional and regional lymph nodes, and 4 sites of bone metastases. He does fit into high volume disease per CHAARTED trial. PSA 12.4 with GG5 (Gleason 4+5=9). His PSA increased more than 3x in about a year. Overall the presentation is concerning for aggressive disease in nature and I agree with him already started APRI with ADT.  Fortunately, he is fairly asymptomatic.  He has excellent performance status despite his age.  I recommend addition of docetaxel as triplet therapy.  Discussed ARASENS trial. We over the data together today with his wife.  An international, phase 3 trial, we randomly assigned patients with metastatic, hormone-sensitive prostate cancer in a 1:1 ratio to receive darolutamide 600-mg twice daily) or matching placebo, both in combination with androgen-deprivation therapy and docetaxel. Bone metastases were well represented. NEJM  2022;386:1132-1142.  The overall survival at 4 years was 62.7% in the darolutamide group and 50.4% in the placebo group. M1 disease was well represented. We do not have data without docetaxel in this study. Time to CRPC was over 4 years with triplet therapy.  I explained to him what this means.  We discussed docetaxel chemotherapy.  Potential side effect including fatigue, neutropenia, febrile neutropenia, thrombocytopenia, anemia, alopecia, skin rash, loss of nail and potentially infection, hypersensitivity, fluid retention with  edema, diarrhea, nausea, vomiting, mouth sores, liver enzyme elevation, neuropathy, muscle pain, joint pain, visual change and rarely severe allergic reaction, skin rash, severe infection resulting in sepsis, and potential death.  Discussed the importance of recognizing fever, signs of infection while on treatment. Proceed to ED for emergency evaluation in the setting of fever, infection. Severe infection resulting in sepsis can be life threatening and result in death if not treated early.  ARPI can cause fatigue, and rarely rash, heart failure, ischemic heart disease in <5% from studies.  In patients undergoing ADT with prostate cancer, aggressive management of cardiovascular risk factors to decrease cardiac events is recommended including controlling hypertension, hyperlipidemia, prevent diabetes and regular exercises are recommended.  He is seeking second opinion at [facility]. I let him know I am more than happy to communicate with the faculty there regarding their opinion after his consultation.  He has appointment with our radiation oncologist Dr. Kathrynn Running.  I encouraged him to proceed with the consultation. We will communicate after his meeting.  For future, also recommend both somatic and germline genetic testing, including MMR, TMB as well. We can arrange lab appointment if he returns for follow up.  Referral to genetics made today.  At risk for side effect of medication Supportive baseline bone mineral density study can be ordered in the upcoming visit and then every 2 years calcium (1000-1200 mg daily from food and supplements) and vitamin D3 (1000 IU daily) Zometa (5 mg IV annually) for osteopenia (T-score between -1.0 and -2.5) on ADT after dental clearance. Recommend him to see his dentist.  Healthy diet to prevent diabetes Weight-bearing exercises (30 minutes per day) Limit alcohol consumption and avoid smoking He should continue to see his cardiology team to  optimize his cardiac condition  while on treatment for active prostate cancer   Orders Placed This Encounter  Procedures   Ambulatory referral to Genetics    Referral Priority:   Routine    Referral Type:   Consultation    Referral Reason:   Specialty Services Required    Number of Visits Requested:   1    The total time spent in the appointment was 60 minutes encounter with patients including review of chart and various tests results, discussions about plan of care and coordination of care plan  All questions were answered. The patient knows to call the clinic with any problems, questions or concerns. No barriers to learning was detected.  Melven Sartorius, MD 9/6/202410:43 AM  CHIEF COMPLAINTS/PURPOSE OF CONSULTATION:  Prostate cancer  HISTORY OF PRESENTING ILLNESS:  Nicholas Paul 72 y.o. male is here because of prostate cancer I have reviewed his chart and materials related to his cancer extensively and collaborated history with the patient. Summary of oncologic history is as follows:  Nicholas Paul is a very pleasant 72 year old male presenting for evaluation of newly diagnosed prostate cancer.  He is in very good health in general.  He denies any physical limitations and still working most of the time, playing golf and physically active.  He denies any new bone pain, back pain, hip pain or sternal pain other than exercise that resolved on its own.  He report some urinary changes back in earlier this year that led to subsequent testing, including later TURP and tissue confirmed prostate adenocarcinoma.  Since his surgery, he has been feeling well, he denies any urinary symptoms.  Detailed relevant history as below.  Oncology History  Prostate cancer metastatic to bone (HCC)  07/15/2017 Tumor Marker   PSA 1.89   09/27/2017 Tumor Marker   PSA 2.26   11/06/2018 Tumor Marker   PSA 3.04   04/22/2019 Tumor Marker   PSA 2.97   12/23/2019 Tumor Marker   PSA 1.76   12/15/2020 Tumor Marker   PSA 2.43   12/18/2021 Tumor  Marker   PSA 3.76   11/27/2022 Surgery   TURP for BPH with Lower urinary tract symptoms. High risk T1b GG5 (Gleason 4+5=9) in 30% of resected tissue.   12/24/2022 Tumor Marker   PSA 12.4   01/01/2023 PET scan   PSMA PET Radiotracer avid glandular noted on the right aspect of the prostate.  Potential more central activity difficult to separate from the urine activity. Focal radiotracer activity within the right seminal vesicle consistent with prostate adenocarcinoma metastases Radiotracer avid right and left iliac lymph nodes consistent with nodal metastases.  Radiotracer avid periaortic retroperitoneal lymph node consistent with nodal metastasis Skeletal metastases including sternum, L3 vertebral body, and left iliac bone.   01/09/2023 -  Chemotherapy   Started Orgovyx 01/17/23 darolutamide   01/17/2023 Initial Diagnosis   Prostate cancer metastatic to bone [facility])     MEDICAL HISTORY:  Past Medical History:  Diagnosis Date   BPH (benign prostatic hyperplasia)    urologist--- dr Mena Goes   Coronary artery disease cardiologist--- Lawson Fiscal gerhardt NP   last CTA 02-17-2018  nonobstructive proximal and mid LAD, calcium score 15;   nuclear stress test 06-09-2003 in epic,  no ischemia w/ ef 59%   GERD (gastroesophageal reflux disease)    H/O: rheumatic fever    noted after tonsillectomy   Head injury, closed, initial encounter 03/26/2019   Hyperlipidemia    Hypertension    followed by cardiology  IBS (irritable bowel syndrome)    Dr Leone Payor   Internal hemorrhoids    Pulmonary nodule    4 mm RML nodule on CT scan in 12/12. Needs follow up in one year.       SURGICAL HISTORY: Past Surgical History:  Procedure Laterality Date   COLONOSCOPY W/ POLYPECTOMY  2006   internal hemorrhoids 2011   SHOULDER ARTHROSCOPY WITH OPEN ROTATOR CUFF REPAIR Right 2022   THULIUM LASER TURP (TRANSURETHRAL RESECTION OF PROSTATE) N/A 07/14/2019   Procedure: THULIUM LASER TURP (TRANSURETHRAL RESECTION OF  PROSTATE);  Surgeon: Jerilee Field, MD;  Location: [facility];  Service: Urology;  Laterality: N/A;   TONSILLECTOMY AND ADENOIDECTOMY      SOCIAL HISTORY: Social History   Socioeconomic History   Marital status: Married    Spouse name: Not on file   Number of children: 2   Years of education: Not on file   Highest education level: Some college, no degree  Occupational History    Employer: [employer]  Tobacco Use   Smoking status: Never   Smokeless tobacco: Never  Vaping Use   Vaping status: Never Used  Substance and Sexual Activity   Alcohol use: Yes    Comment: occasional   Drug use: Never   Sexual activity: Yes    Comment: vasectomy  Other Topics Concern   Not on file  Social History Narrative   ** Merged History Encounter **       Social Determinants of Health   Financial Resource Strain: Low Risk  (09/15/2022)   Overall Financial Resource Strain (CARDIA)    Difficulty of Paying Living Expenses: Not hard at all  Recent Concern: Financial Resource Strain - High Risk (07/18/2022)   Overall Financial Resource Strain (CARDIA)    Difficulty of Paying Living Expenses: Very hard  Food Insecurity: No Food Insecurity (09/15/2022)   Hunger Vital Sign    Worried About Running Out of Food in the Last Year: Never true    Ran Out of Food in the Last Year: Never true  Transportation Needs: No Transportation Needs (09/15/2022)   PRAPARE - Administrator, Civil Service (Medical): No    Lack of Transportation (Non-Medical): No  Physical Activity: Sufficiently Active (09/15/2022)   Exercise Vital Sign    Days of Exercise per Week: 2 days    Minutes of Exercise per Session: 150+ min  Stress: No Stress Concern Present (09/15/2022)   [employer] of Occupational Health - Occupational Stress Questionnaire    Feeling of Stress : Not at all  Social Connections: Moderately Integrated (09/15/2022)   Social Connection and Isolation Panel [NHANES]     Frequency of Communication with Friends and Family: Once a week    Frequency of Social Gatherings with Friends and Family: Once a week    Attends Religious Services: More than 4 times per year    Active Member of [employer] or Organizations: Yes    Attends Banker Meetings: 1 to 4 times per year    Marital Status: Married  Catering manager Violence: Not At Risk (07/18/2022)   Humiliation, Afraid, Rape, and Kick questionnaire    Fear of Current or Ex-Partner: No    Emotionally Abused: No    Physically Abused: No    Sexually Abused: No    FAMILY HISTORY: Family History  Problem Relation Age of Onset   Hyperlipidemia Mother    Hypertension Mother    Cancer Father  bladder cancer   Cancer Brother        bladder cancer   Hypertension Brother    Cancer Paternal Aunt        bladder cancer   Stroke Neg Hx    Diabetes Neg Hx    Heart attack Neg Hx    Colon cancer Neg Hx    Colon polyps Neg Hx    Esophageal cancer Neg Hx    Rectal cancer Neg Hx    Stomach cancer Neg Hx     ALLERGIES:  is allergic to doxazosin mesylate and crestor [rosuvastatin].  MEDICATIONS:  Current Outpatient Medications  Medication Sig Dispense Refill   amLODipine (NORVASC) 5 MG tablet TAKE 1 TABLET (5 MG TOTAL) BY MOUTH DAILY. 90 tablet 3   aspirin EC 81 MG tablet Take 81 mg by mouth daily.     atorvastatin (LIPITOR) 10 MG tablet TAKE 1 TABLET BY MOUTH EVERY DAY 90 tablet 3   benazepril (LOTENSIN) 40 MG tablet TAKE 1 TABLET BY MOUTH EVERY DAY 90 tablet 3   Cholecalciferol (VITAMIN D) 125 MCG (5000 UT) CAPS Take 125 mcg by mouth daily.      Coenzyme Q10 (CO Q 10 PO) Take 1 capsule by mouth daily.     darolutamide (NUBEQA) 300 MG tablet Take 600 mg by mouth 2 (two) times daily with a meal.     famotidine (PEPCID) 10 MG tablet Take 10 mg by mouth as needed for heartburn or indigestion.     hydrochlorothiazide (MICROZIDE) 12.5 MG capsule TAKE 1 CAPSULE BY MOUTH EVERY DAY 90 capsule 2   Naproxen  Sod-diphenhydrAMINE (ALEVE PM) 220-25 MG TABS Take 1 tablet by mouth as needed.      Omega-3 Fatty Acids (OMEGA 3 PO) Take 1 capsule by mouth daily.      psyllium (METAMUCIL) 58.6 % packet Take 2 packets by mouth 2 (two) times daily.      relugolix (ORGOVYX) 120 MG tablet Take 120 mg by mouth daily.     No current facility-administered medications for this visit.    REVIEW OF SYSTEMS:   Constitutional: Denies decrease in appetite or loss of energy. Respiratory: Denies cough, shortness of breath Cardiovascular: Denies active chest pain, chest discomfort Gastrointestinal:  Denies nausea, abdominal pain or change in bowel habits Neurological: Denies numbness, tingling or new weaknesses Musculoskeletal: Denies any new bone pain, back pain or hip pain all morning. All other systems were reviewed with the patient and are negative.  PHYSICAL EXAMINATION: ECOG PERFORMANCE STATUS: 0 - Asymptomatic  Vitals:   01/18/23 0834 01/18/23 0838  BP: (!) 147/79 132/62  Pulse: 64   Resp: 18   Temp: 97.9 F (36.6 C)   SpO2: 100%    Filed Weights   01/18/23 0834  Weight: 174 lb 1.6 oz (79 kg)    GENERAL:alert, no distress and comfortable LUNGS: Effort normal and no respiratory distress Musculoskeletal:no point tenderness on spine, hips and sternum   LABORATORY DATA:  I have reviewed the data as listed Lab Results  Component Value Date   WBC 6.4 11/05/2022   HGB 13.9 11/05/2022   HCT 41.2 11/05/2022   MCV 89.6 11/05/2022   PLT 248.0 11/05/2022   Recent Labs    11/05/22 1340  NA 136  K 3.9  CL 101  CO2 30  GLUCOSE 120*  BUN 13  CREATININE 0.89  CALCIUM 9.5  PROT 7.5  ALBUMIN 4.4  AST 23  ALT 20  ALKPHOS 71  BILITOT 1.5*  RADIOGRAPHIC STUDIES: I have personally reviewed the radiological images as listed and agreed with the findings in the report. NM PET (PSMA) SKULL TO MID THIGH  Result Date: 01/01/2023 CLINICAL DATA:  Prostate carcinoma with biochemical recurrence.  Gleason 4+5=9 adenocarcinoma. EXAM: NUCLEAR MEDICINE PET SKULL BASE TO THIGH TECHNIQUE: 8.39 mCi F18 Piflufolastat (Pylarify) was injected intravenously. Full-ring PET imaging was performed from the skull base to thigh after the radiotracer. CT data was obtained and used for attenuation correction and anatomic localization. COMPARISON:  None Available. FINDINGS: NECK No radiotracer activity in neck lymph nodes. Incidental CT finding: None. CHEST No radiotracer accumulation within mediastinal or hilar lymph nodes. No suspicious pulmonary nodules on the CT scan. Incidental CT finding: None. ABDOMEN/PELVIS Prostate: Radiotracer urine fills the TURP defect centrally within the prostate gland. This makes evaluation somewhat difficult. Radiotracer avid glandular noted on the RIGHT aspect of prostate. Potential more central activity difficult to separate from the urine activity. Focal activity within the RIGHT seminal vesicle with SUV max equal 7.6 on image 205. Lymph nodes: Radiotracer avid RIGHT and LEFT iliac lymph nodes noted. 8 mm RIGHT internal iliac lymph node (image 201/series 4) with SUV max equal 7.6. A small LEFT common iliac lymph node measuring 5 mm (181) with SUV max equal 6.3. More superiorly there radiotracer avid periaortic retroperitoneal lymph nodes to level of the renal veins. Example adjacent radiotracer avid nodes LEFT aorta on image 156 Liver: No evidence of liver metastasis. Incidental CT finding: None. SKELETON Small focus of radiotracer activity within the RIGHT aspect of the sternum on image. Large region of intense radiotracer activity in the L3 vertebral body with SUV max equal 6.9. No CT correlation. Focal activity in the LEFT iliac bone with SUV max equal 4.1 on image 188. No CT correlation. There is skeletal lesion the posterior LEFT iliac bone on image 191 radiotracer avid without CT correlation IMPRESSION: 1. Radiotracer avid glandular noted on the RIGHT aspect of the prostate. Potential  more central activity difficult to separate from the urine activity. 2. Focal radiotracer activity within the RIGHT seminal vesicle consistent with prostate adenocarcinoma metastasis. 3. Radiotracer avid RIGHT and LEFT iliac lymph nodes consistent with nodal metastasis. More superiorly there are radiotracer avid periaortic retroperitoneal lymph nodes consistent with nodal metastasis. 4. Radiotracer avid skeletal metastasis including the sternum, L3 vertebral body, and LEFT iliac bone. Electronically Signed   By: Genevive Bi M.D.   On: 01/01/2023 13:08

## 2023-01-18 ENCOUNTER — Inpatient Hospital Stay: Payer: Medicare Other

## 2023-01-18 VITALS — BP 132/62 | HR 64 | Temp 97.9°F | Resp 18 | Ht 70.0 in | Wt 174.1 lb

## 2023-01-18 DIAGNOSIS — C61 Malignant neoplasm of prostate: Secondary | ICD-10-CM | POA: Diagnosis not present

## 2023-01-18 DIAGNOSIS — Z79899 Other long term (current) drug therapy: Secondary | ICD-10-CM | POA: Diagnosis not present

## 2023-01-18 DIAGNOSIS — C7951 Secondary malignant neoplasm of bone: Secondary | ICD-10-CM | POA: Insufficient documentation

## 2023-01-18 DIAGNOSIS — I10 Essential (primary) hypertension: Secondary | ICD-10-CM | POA: Diagnosis not present

## 2023-01-18 DIAGNOSIS — Z9189 Other specified personal risk factors, not elsewhere classified: Secondary | ICD-10-CM

## 2023-01-18 DIAGNOSIS — E785 Hyperlipidemia, unspecified: Secondary | ICD-10-CM | POA: Insufficient documentation

## 2023-01-18 NOTE — Progress Notes (Signed)
Introduced myself to the patient, and his wife, as the prostate nurse navigator.  No barriers to care identified at this time.  He is here to discuss his medical oncology treatment options, and will also plan on having a second opinion at [facility].  Patient will also see Rad Onc on 9/17.  I gave him my business card and asked him to call me with questions or concerns.  Verbalized understanding.

## 2023-01-22 ENCOUNTER — Other Ambulatory Visit (HOSPITAL_COMMUNITY): Payer: Self-pay

## 2023-01-23 ENCOUNTER — Ambulatory Visit: Payer: Medicare Other | Admitting: Internal Medicine

## 2023-01-28 ENCOUNTER — Encounter: Payer: Self-pay | Admitting: Radiation Oncology

## 2023-01-28 NOTE — Progress Notes (Signed)
Histology and Location of Primary Cancer: Prostate Ca  Gleason score 4+5 = 9, PSA 12.4 on 12/2022  12/28/2022 Dr. Jerilee Field NM PET (PSMA) Skull to Mid Thigh CLINICAL DATA:  Prostate carcinoma with biochemical recurrence. Gleason 4+5=9 adenocarcinoma.  IMPRESSION: 1. Radiotracer avid glandular noted on the RIGHT aspect of the prostate. Potential more central activity difficult to separate from the urine activity. 2. Focal radiotracer activity within the RIGHT seminal vesicle consistent with prostate adenocarcinoma metastasis. 3. Radiotracer avid RIGHT and LEFT iliac lymph nodes consistent with nodal metastasis. More superiorly there are radiotracer avid periaortic retroperitoneal lymph nodes consistent with nodal metastasis. 4. Radiotracer avid skeletal metastasis including the sternum, L3 vertebral body, and LEFT iliac bone.  Sites of Visceral and Bony Metastatic Disease: Sternum, L3 vertebral body, and LEFT iliac bone.  Location(s) of Symptomatic Metastases: Sternum, L3 vertebral body, and LEFT iliac bone.  Past/Anticipated chemotherapy by medical oncology, if any:   01/18/2023 Dr. Geanie Berlin   Pain on a scale of 0-10 is: {Number; 1-10  not applicable:20727}    If Spine Met(s), symptoms, if any, include: Bowel/Bladder retention or incontinence (please describe): *** Numbness or weakness in extremities (please describe): *** Current Decadron regimen, if applicable: ***  Ambulatory status? Walker? Wheelchair?: {VQI Ambulatory Status:20974}  SAFETY ISSUES: Prior radiation? {:18581} Pacemaker/ICD? {:18581} Possible current pregnancy? Male Is the patient on methotrexate? No  Current Complaints / other details:

## 2023-01-28 NOTE — Progress Notes (Signed)
Radiation Oncology         [phone]) [phone] ________________________________  Initial outpatient Consultation  Name: Nicholas Paul MRN: [id number]  Date of Service: 01/29/2023 DOB: [birth year]  CC:Nicholas Sanes, MD  Nicholas Field, MD   REFERRING PHYSICIAN: Jerilee Field, MD  DIAGNOSIS: {There were no encounter diagnoses. (Refresh or delete this SmartLink)}  No diagnosis found.  HISTORY OF PRESENT ILLNESS: Nicholas Paul is a 72 y.o. male seen at the request of Dr. Mena Goes. He has been followed by [facility] since at least 2021 for a history of BPH and ED.  PREVIOUS RADIATION THERAPY: {EXAM; YES/NO:19492::"No"}  PAST MEDICAL HISTORY:  Past Medical History:  Diagnosis Date   BPH (benign prostatic hyperplasia)    urologist--- dr Mena Goes   Coronary artery disease cardiologist--- Lawson Fiscal gerhardt NP   last CTA 02-17-2018  nonobstructive proximal and mid LAD, calcium score 15;   nuclear stress test 06-09-2003 in epic,  no ischemia w/ ef 59%   GERD (gastroesophageal reflux disease)    H/O: rheumatic fever    noted after tonsillectomy   Head injury, closed, initial encounter 03/26/2019   Hyperlipidemia    Hypertension    followed by cardiology   IBS (irritable bowel syndrome)    Dr Leone Payor   Internal hemorrhoids    Pulmonary nodule    4 mm RML nodule on CT scan in 12/12. Needs follow up in one year.         PAST SURGICAL HISTORY: Past Surgical History:  Procedure Laterality Date   COLONOSCOPY W/ POLYPECTOMY  2006   internal hemorrhoids 2011   PROSTATE BIOPSY     SHOULDER ARTHROSCOPY WITH OPEN ROTATOR CUFF REPAIR Right 2022   THULIUM LASER TURP (TRANSURETHRAL RESECTION OF PROSTATE) N/A 07/14/2019   Procedure: THULIUM LASER TURP (TRANSURETHRAL RESECTION OF PROSTATE);  Surgeon: Nicholas Field, MD;  Location: [facility];  Service: Urology;  Laterality: N/A;   TONSILLECTOMY AND ADENOIDECTOMY      FAMILY HISTORY:  Family History  Problem  Relation Age of Onset   Hyperlipidemia Mother    Hypertension Mother    Cancer Father        bladder cancer   Cancer Brother        bladder cancer   Hypertension Brother    Cancer Paternal Aunt        bladder cancer   Stroke Neg Hx    Diabetes Neg Hx    Heart attack Neg Hx    Colon cancer Neg Hx    Colon polyps Neg Hx    Esophageal cancer Neg Hx    Rectal cancer Neg Hx    Stomach cancer Neg Hx     SOCIAL HISTORY:  Social History   Socioeconomic History   Marital status: Married    Spouse name: Not on file   Number of children: 2   Years of education: Not on file   Highest education level: Some college, no degree  Occupational History    Employer: [employer]  Tobacco Use   Smoking status: Never   Smokeless tobacco: Never  Vaping Use   Vaping status: Never Used  Substance and Sexual Activity   Alcohol use: Yes    Comment: occasional   Drug use: Never   Sexual activity: Yes    Comment: vasectomy  Other Topics Concern   Not on file  Social History Narrative   ** Merged History Encounter **       Social Determinants of Health  Financial Resource Strain: Low Risk  (09/15/2022)   Overall Financial Resource Strain (CARDIA)    Difficulty of Paying Living Expenses: Not hard at all  Recent Concern: Financial Resource Strain - High Risk (07/18/2022)   Overall Financial Resource Strain (CARDIA)    Difficulty of Paying Living Expenses: Very hard  Food Insecurity: No Food Insecurity (09/15/2022)   Hunger Vital Sign    Worried About Running Out of Food in the Last Year: Never true    Ran Out of Food in the Last Year: Never true  Transportation Needs: No Transportation Needs (09/15/2022)   PRAPARE - Administrator, Civil Service (Medical): No    Lack of Transportation (Non-Medical): No  Physical Activity: Sufficiently Active (09/15/2022)   Exercise Vital Sign    Days of Exercise per Week: 2 days    Minutes of Exercise per Session: 150+ min  Stress: No Stress  Concern Present (09/15/2022)   [employer] of Occupational Health - Occupational Stress Questionnaire    Feeling of Stress : Not at all  Social Connections: Moderately Integrated (09/15/2022)   Social Connection and Isolation Panel [NHANES]    Frequency of Communication with Friends and Family: Once a week    Frequency of Social Gatherings with Friends and Family: Once a week    Attends Religious Services: More than 4 times per year    Active Member of [employer] or Organizations: Yes    Attends Banker Meetings: 1 to 4 times per year    Marital Status: Married  Catering manager Violence: Not At Risk (07/18/2022)   Humiliation, Afraid, Rape, and Kick questionnaire    Fear of Current or Ex-Partner: No    Emotionally Abused: No    Physically Abused: No    Sexually Abused: No    ALLERGIES: Doxazosin mesylate and Crestor [rosuvastatin]  MEDICATIONS:  Current Outpatient Medications  Medication Sig Dispense Refill   amLODipine (NORVASC) 5 MG tablet TAKE 1 TABLET (5 MG TOTAL) BY MOUTH DAILY. 90 tablet 3   aspirin EC 81 MG tablet Take 81 mg by mouth daily.     atorvastatin (LIPITOR) 10 MG tablet TAKE 1 TABLET BY MOUTH EVERY DAY 90 tablet 3   benazepril (LOTENSIN) 40 MG tablet TAKE 1 TABLET BY MOUTH EVERY DAY 90 tablet 3   Cholecalciferol (VITAMIN D) 125 MCG (5000 UT) CAPS Take 125 mcg by mouth daily.      Coenzyme Q10 (CO Q 10 PO) Take 1 capsule by mouth daily.     darolutamide (NUBEQA) 300 MG tablet Take 600 mg by mouth 2 (two) times daily with a meal.     famotidine (PEPCID) 10 MG tablet Take 10 mg by mouth as needed for heartburn or indigestion.     hydrochlorothiazide (MICROZIDE) 12.5 MG capsule TAKE 1 CAPSULE BY MOUTH EVERY DAY 90 capsule 2   Naproxen Sod-diphenhydrAMINE (ALEVE PM) 220-25 MG TABS Take 1 tablet by mouth as needed.      Omega-3 Fatty Acids (OMEGA 3 PO) Take 1 capsule by mouth daily.      psyllium (METAMUCIL) 58.6 % packet Take 2 packets by mouth 2 (two) times  daily.      relugolix (ORGOVYX) 120 MG tablet Take 120 mg by mouth daily.     No current facility-administered medications for this encounter.    REVIEW OF SYSTEMS:  On review of systems, the patient reports that *** is doing well overall. *** denies any chest pain, shortness of breath, cough, fevers, chills, night  sweats, unintended weight changes. *** denies any bowel or bladder disturbances, and denies abdominal pain, nausea or vomiting. *** denies any new musculoskeletal or joint aches or pains. A complete review of systems is obtained and is otherwise negative.    PHYSICAL EXAM:  Wt Readings from Last 3 Encounters:  01/18/23 174 lb 1.6 oz (79 kg)  11/05/22 168 lb (76.2 kg)  07/18/22 178 lb (80.7 kg)   Temp Readings from Last 3 Encounters:  01/18/23 97.9 F (36.6 C) (Oral)  11/05/22 97.9 F (36.6 C) (Oral)  01/22/22 97.8 F (36.6 C)   BP Readings from Last 3 Encounters:  01/18/23 132/62  11/05/22 120/70  03/08/22 116/78   Pulse Readings from Last 3 Encounters:  01/18/23 64  11/05/22 74  03/08/22 70    /10  In general this is a well appearing *** in no acute distress. *** is alert and oriented x4 and appropriate throughout the examination. HEENT reveals that the patient is normocephalic, atraumatic. EOMs are intact. PERRLA. Skin is intact without any evidence of gross lesions. Cardiovascular exam reveals a regular rate and rhythm, no clicks rubs or murmurs are auscultated. Chest is clear to auscultation bilaterally. Lymphatic assessment is performed and does not reveal any adenopathy in the cervical, supraclavicular, axillary, or inguinal chains. Abdomen has active bowel sounds in all quadrants and is intact. The abdomen is soft, non tender, non distended. Lower extremities are negative for pretibial pitting edema, deep calf tenderness, cyanosis or clubbing.   KPS = ***  100 - Normal; no complaints; no evidence of disease. 90   - Able to carry on normal activity; minor  signs or symptoms of disease. 80   - Normal activity with effort; some signs or symptoms of disease. 66   - Cares for self; unable to carry on normal activity or to do active work. 60   - Requires occasional assistance, but is able to care for most of his personal needs. 50   - Requires considerable assistance and frequent medical care. 40   - Disabled; requires special care and assistance. 30   - Severely disabled; hospital admission is indicated although death not imminent. 20   - Very sick; hospital admission necessary; active supportive treatment necessary. 10   - Moribund; fatal processes progressing rapidly. 0     - Dead  Karnofsky DA, Abelmann WH, Craver LS and Burchenal [facility] [phone]) The use of the nitrogen mustards in the palliative treatment of carcinoma: with particular reference to bronchogenic carcinoma Cancer 1 634-56  LABORATORY DATA:  Lab Results  Component Value Date   WBC 6.4 11/05/2022   HGB 13.9 11/05/2022   HCT 41.2 11/05/2022   MCV 89.6 11/05/2022   PLT 248.0 11/05/2022   Lab Results  Component Value Date   NA 136 11/05/2022   K 3.9 11/05/2022   CL 101 11/05/2022   CO2 30 11/05/2022   Lab Results  Component Value Date   ALT 20 11/05/2022   AST 23 11/05/2022   ALKPHOS 71 11/05/2022   BILITOT 1.5 (H) 11/05/2022     RADIOGRAPHY: No results found.    IMPRESSION/PLAN: 1. 72 y.o. ***    I personally spent *** minutes in this encounter including chart review, reviewing radiological studies, meeting face-to-face with the patient, entering orders and completing documentation.    Marguarite Arbour, PA-C    Margaretmary Dys, MD  [facility]  Radiation Oncology Direct Dial: [phone]  Fax: [phone] [email or web address]  Skype  [email or web address]   This  document serves as a record of services personally performed by Margaretmary Dys, MD and Marcello Fennel, PA-C. It was created on their behalf by Mickie Bail, a trained medical scribe. The creation of this  record is based on the scribe's personal observations and the provider's statements to them. This document has been checked and approved by the attending provider.

## 2023-01-29 ENCOUNTER — Other Ambulatory Visit: Payer: Self-pay | Admitting: Urology

## 2023-01-29 ENCOUNTER — Ambulatory Visit
Admission: RE | Admit: 2023-01-29 | Discharge: 2023-01-29 | Disposition: A | Discharge status: Home / Self Care | Payer: Medicare Other | Source: Ambulatory Visit | Attending: Radiation Oncology | Admitting: Radiation Oncology

## 2023-01-29 ENCOUNTER — Encounter: Payer: Self-pay | Admitting: Radiation Oncology

## 2023-01-29 VITALS — BP 135/49 | HR 70 | Temp 98.0°F | Resp 18 | Ht 69.5 in | Wt 169.8 lb

## 2023-01-29 DIAGNOSIS — I2510 Atherosclerotic heart disease of native coronary artery without angina pectoris: Secondary | ICD-10-CM | POA: Diagnosis not present

## 2023-01-29 DIAGNOSIS — C61 Malignant neoplasm of prostate: Secondary | ICD-10-CM | POA: Diagnosis not present

## 2023-01-29 DIAGNOSIS — Z8052 Family history of malignant neoplasm of bladder: Secondary | ICD-10-CM | POA: Diagnosis not present

## 2023-01-29 DIAGNOSIS — C7951 Secondary malignant neoplasm of bone: Secondary | ICD-10-CM

## 2023-01-29 DIAGNOSIS — E785 Hyperlipidemia, unspecified: Secondary | ICD-10-CM | POA: Diagnosis not present

## 2023-01-29 DIAGNOSIS — Z79899 Other long term (current) drug therapy: Secondary | ICD-10-CM | POA: Insufficient documentation

## 2023-01-29 DIAGNOSIS — Z8719 Personal history of other diseases of the digestive system: Secondary | ICD-10-CM | POA: Diagnosis not present

## 2023-01-29 DIAGNOSIS — Z7982 Long term (current) use of aspirin: Secondary | ICD-10-CM | POA: Diagnosis not present

## 2023-01-29 DIAGNOSIS — K219 Gastro-esophageal reflux disease without esophagitis: Secondary | ICD-10-CM | POA: Insufficient documentation

## 2023-01-29 DIAGNOSIS — Z191 Hormone sensitive malignancy status: Secondary | ICD-10-CM | POA: Diagnosis not present

## 2023-01-29 DIAGNOSIS — K589 Irritable bowel syndrome without diarrhea: Secondary | ICD-10-CM | POA: Insufficient documentation

## 2023-01-29 DIAGNOSIS — I10 Essential (primary) hypertension: Secondary | ICD-10-CM | POA: Diagnosis not present

## 2023-01-29 NOTE — Progress Notes (Signed)
RN meet with patient during his consult visit with Dr. Kathrynn Running.  Patient has not been scheduled for second opinion at [facility] at this time, PCP originate referral.   With agreement from patient RN will facilitate referral for second opinion at [facility].

## 2023-01-31 ENCOUNTER — Telehealth: Payer: Self-pay | Admitting: *Deleted

## 2023-01-31 NOTE — Progress Notes (Signed)
Patient is now scheduled for second opinion at [facility] on 10/7.  RN spoke with patient and he is aware.  Will follow up after 10/7 appointment to review treatment decision/location.

## 2023-01-31 NOTE — Telephone Encounter (Signed)
CALLED PATIENT TO INFORM OF FID. MARKER AND SPACE OAR GEL PLACEMENT ON 03-19-23 AND HIS SIM ON 03-21-23 - ARRIVAL TIME- 10:45 AM @ [facility], INFORMED PATIENT TO ARRIVE WITH A FULL BLADDER, LVM FOR A RETURN CALL

## 2023-02-08 DIAGNOSIS — C61 Malignant neoplasm of prostate: Secondary | ICD-10-CM | POA: Diagnosis not present

## 2023-02-13 ENCOUNTER — Other Ambulatory Visit: Payer: Self-pay | Admitting: Genetic Counselor

## 2023-02-13 ENCOUNTER — Encounter: Payer: Self-pay | Admitting: Genetic Counselor

## 2023-02-13 ENCOUNTER — Inpatient Hospital Stay: Payer: Medicare Other

## 2023-02-13 ENCOUNTER — Inpatient Hospital Stay: Payer: Medicare Other | Admitting: Genetic Counselor

## 2023-02-13 DIAGNOSIS — C7951 Secondary malignant neoplasm of bone: Secondary | ICD-10-CM

## 2023-02-13 DIAGNOSIS — Z8042 Family history of malignant neoplasm of prostate: Secondary | ICD-10-CM

## 2023-02-13 DIAGNOSIS — I10 Essential (primary) hypertension: Secondary | ICD-10-CM | POA: Insufficient documentation

## 2023-02-13 DIAGNOSIS — C61 Malignant neoplasm of prostate: Secondary | ICD-10-CM | POA: Insufficient documentation

## 2023-02-13 DIAGNOSIS — Z5111 Encounter for antineoplastic chemotherapy: Secondary | ICD-10-CM | POA: Insufficient documentation

## 2023-02-13 DIAGNOSIS — Z23 Encounter for immunization: Secondary | ICD-10-CM | POA: Insufficient documentation

## 2023-02-13 DIAGNOSIS — Z8052 Family history of malignant neoplasm of bladder: Secondary | ICD-10-CM

## 2023-02-13 DIAGNOSIS — Z79899 Other long term (current) drug therapy: Secondary | ICD-10-CM | POA: Insufficient documentation

## 2023-02-13 DIAGNOSIS — Z5189 Encounter for other specified aftercare: Secondary | ICD-10-CM | POA: Insufficient documentation

## 2023-02-13 LAB — GENETIC SCREENING ORDER

## 2023-02-13 NOTE — Progress Notes (Signed)
REFERRING PROVIDER: Melven Sartorius, MD [address] [place],  [place] [address]  PRIMARY PROVIDER:  [name], MD  PRIMARY REASON FOR VISIT:  1. Family history of bladder cancer   2. Family history of prostate cancer   3. Prostate cancer metastatic to bone [facility])      HISTORY OF PRESENT ILLNESS:   Nicholas Paul, a 72 y.o. male, was seen for a [place] cancer genetics consultation at the request of Dr. Cherly Hensen due to a personal and family history of cancer.  Nicholas Paul presents to clinic today to discuss the possibility of a hereditary predisposition to cancer, genetic testing, and to further clarify his future cancer risks, as well as potential cancer risks for family members.   In September 2024, at the age of 72, Nicholas Paul was diagnosed with metastatic cancer of the prostate.     CANCER HISTORY:  Oncology History  Prostate cancer metastatic to bone (HCC)  07/15/2017 Tumor Marker   PSA 1.89   09/27/2017 Tumor Marker   PSA 2.26   11/06/2018 Tumor Marker   PSA 3.04   04/22/2019 Tumor Marker   PSA 2.97   12/23/2019 Tumor Marker   PSA 1.76   12/15/2020 Tumor Marker   PSA 2.43   12/18/2021 Tumor Marker   PSA 3.76   11/27/2022 Surgery   TURP for BPH with Lower urinary tract symptoms. High risk T1b GG5 (Gleason 4+5=9) in 30% of resected tissue.   12/24/2022 Tumor Marker   PSA 12.4   01/01/2023 PET scan   PSMA PET Radiotracer avid glandular noted on the right aspect of the prostate.  Potential more central activity difficult to separate from the urine activity. Focal radiotracer activity within the right seminal vesicle consistent with prostate adenocarcinoma metastases Radiotracer avid right and left iliac lymph nodes consistent with nodal metastases.  Radiotracer avid periaortic retroperitoneal lymph node consistent with nodal metastasis Skeletal metastases including sternum, L3 vertebral body, and left iliac bone.   01/09/2023 -  Chemotherapy   Started Orgovyx 01/17/23  darolutamide   01/17/2023 Initial Diagnosis   Prostate cancer metastatic to bone (HCC)   01/18/2023 Cancer Staging   Staging form: Prostate, AJCC 8th Edition - Clinical: Stage IVB (cT1b, cN1, cM1b, PSA: 12.4, Grade Group: 5) - Signed by Melven Sartorius, MD on 01/18/2023 Prostate specific antigen (PSA) range: 10 to 19 Gleason primary pattern: 4 Gleason secondary pattern: 5 Histologic grading system: 5 grade system     Past Medical History:  Diagnosis Date   BPH (benign prostatic hyperplasia)    urologist--- dr Mena Goes   Coronary artery disease cardiologist--- Lawson Fiscal gerhardt NP   last CTA 02-17-2018  nonobstructive proximal and mid LAD, calcium score 15;   nuclear stress test 06-09-2003 in epic,  no ischemia w/ ef 59%   Family history of bladder cancer    Family history of prostate cancer    GERD (gastroesophageal reflux disease)    H/O: rheumatic fever    noted after tonsillectomy   Head injury, closed, initial encounter 03/26/2019   Hyperlipidemia    Hypertension    followed by cardiology   IBS (irritable bowel syndrome)    Dr Leone Payor   Internal hemorrhoids    Pulmonary nodule    4 mm RML nodule on CT scan in 12/12. Needs follow up in one year.       Past Surgical History:  Procedure Laterality Date   COLONOSCOPY W/ POLYPECTOMY  2006   internal hemorrhoids 2011  PROSTATE BIOPSY     SHOULDER ARTHROSCOPY WITH OPEN ROTATOR CUFF REPAIR Right 2022   THULIUM LASER TURP (TRANSURETHRAL RESECTION OF PROSTATE) N/A 07/14/2019   Procedure: THULIUM LASER TURP (TRANSURETHRAL RESECTION OF PROSTATE);  Surgeon: Jerilee Field, MD;  Location: [facility];  Service: Urology;  Laterality: N/A;   TONSILLECTOMY AND ADENOIDECTOMY      Social History   Socioeconomic History   Marital status: Married    Spouse name: Not on file   Number of children: 2   Years of education: Not on file   Highest education level: Some college, no degree  Occupational History    Employer:  [employer]  Tobacco Use   Smoking status: Never   Smokeless tobacco: Never  Vaping Use   Vaping status: Never Used  Substance and Sexual Activity   Alcohol use: Yes    Comment: occasional   Drug use: Never   Sexual activity: Yes    Comment: vasectomy  Other Topics Concern   Not on file  Social History Narrative   ** Merged History Encounter **       Social Determinants of Health   Financial Resource Strain: Low Risk  (01/31/2023)   Received from [facility]   Overall Financial Resource Strain (CARDIA)    Difficulty of Paying Living Expenses: Not hard at all  Food Insecurity: No Food Insecurity (01/31/2023)   Received from [facility]   Hunger Vital Sign    Worried About Running Out of Food in the Last Year: Never true    Ran Out of Food in the Last Year: Never true  Transportation Needs: No Transportation Needs (01/29/2023)   PRAPARE - Administrator, Civil Service (Medical): No    Lack of Transportation (Non-Medical): No  Physical Activity: Sufficiently Active (09/15/2022)   Exercise Vital Sign    Days of Exercise per Week: 2 days    Minutes of Exercise per Session: 150+ min  Stress: No Stress Concern Present (09/15/2022)   [employer] of Occupational Health - Occupational Stress Questionnaire    Feeling of Stress : Not at all  Social Connections: Moderately Integrated (09/15/2022)   Social Connection and Isolation Panel [NHANES]    Frequency of Communication with Friends and Family: Once a week    Frequency of Social Gatherings with Friends and Family: Once a week    Attends Religious Services: More than 4 times per year    Active Member of [employer] or Organizations: Yes    Attends Banker Meetings: 1 to 4 times per year    Marital Status: Married     FAMILY HISTORY:  We obtained a detailed, 4-generation family history.  Significant diagnoses are listed below: Family History  Problem Relation Age of Onset   Hyperlipidemia Mother     Hypertension Mother    Bladder Cancer Father 22       heavy smoker   Bladder Cancer Brother        dx. 30s, non-smoker   Hypertension Brother    Brain cancer Maternal Uncle 15       glioblastoma   Bladder Cancer Paternal Aunt    Prostate cancer Paternal Grandfather        d. 50s   Prostate cancer Cousin        mat first cousin   Stroke Neg Hx    Diabetes Neg Hx    Heart attack Neg Hx    Colon cancer Neg Hx  Colon polyps Neg Hx    Esophageal cancer Neg Hx    Rectal cancer Neg Hx    Stomach cancer Neg Hx      The patient has a son and daughter who are cancer free.  He has two brothers and a sister.  One brother was diagnosed with bladder cancer in his early 50's. Both parent are deceased.  The patient's father had bladder cancer at 84.  He had seven siblings, one sister also had bladder cancer.  His father had prostate cancer.  The patient's mother did not have cancer.  She had two brothers, one brother died of glioblastoma and the other brother has a son with prostate cancer.  Nicholas Paul is unaware of previous family history of genetic testing for hereditary cancer risks. There is no reported Ashkenazi Jewish ancestry. There is no known consanguinity.  GENETIC COUNSELING ASSESSMENT: Mr. Edmiston is a 72 y.o. male with a personal and family history of cancer which is somewhat suggestive of a hereditary cancer syndrome and predisposition to cancer given the number of cases of cancer in the family and the patient's metastatic status. We, therefore, discussed and recommended the following at today's visit.   DISCUSSION: We discussed that, in general, most cancer is not inherited in families, but instead is sporadic or familial. Sporadic cancers occur by chance and typically happen at older ages (>50 years) as this type of cancer is caused by genetic changes acquired during an individual's lifetime. Some families have more cancers than would be expected by chance; however, the ages or  types of cancer are not consistent with a known genetic mutation or known genetic mutations have been ruled out. This type of familial cancer is thought to be due to a combination of multiple genetic, environmental, hormonal, and lifestyle factors. While this combination of factors likely increases the risk of cancer, the exact source of this risk is not currently identifiable or testable.  We discussed that up to 15% of prostate cancer is hereditary, with most cases associated with BRCA mutations.  There are other genes that can be associated with hereditary prostate cancer syndromes.  These include ATM, PALB2 and HOXB13.  We can also see prostate cancer associated with Lynch syndrome, which is the condition we most commonly see bladder cancer.  We discussed that testing is beneficial for several reasons including knowing how to follow individuals after completing their treatment, identifying whether potential treatment options such as PARP inhibitors would be beneficial, and understand if other family members could be at risk for cancer and allow them to undergo genetic testing.   We reviewed the characteristics, features and inheritance patterns of hereditary cancer syndromes. We also discussed genetic testing, including the appropriate family members to test, the process of testing, insurance coverage and turn-around-time for results. We discussed the implications of a negative, positive, carrier and/or variant of uncertain significant result. Nicholas Paul  was offered a common hereditary cancer panel (40+ genes) and an expanded pan-cancer panel (70+ genes). Nicholas Paul was informed of the benefits and limitations of each panel, including that expanded pan-cancer panels contain genes that do not have clear management guidelines at this point in time.  We also discussed that as the number of genes included on a panel increases, the chances of variants of uncertain significance increases. Nicholas Paul decided to  pursue genetic testing for the CancerNext-Expanded+RNAinsight gene panel.   The CancerNext-Expanded gene panel offered by [facility] and includes sequencing and rearrangement analysis for the following  77 genes: AIP, ALK, APC*, ATM*, AXIN2, BAP1, BARD1, BMPR1A, BRCA1*, BRCA2*, BRIP1*, CDC73, CDH1*, CDK4, CDKN1B, CDKN2A, CHEK2*, CTNNA1, DICER1, FH, FLCN, KIF1B, LZTR1, MAX, MEN1, MET, MLH1*, MSH2*, MSH3, MSH6*, MUTYH*, NF1*, NF2, NTHL1, PALB2*, PHOX2B, PMS2*, POT1, PRKAR1A, PTCH1, PTEN*, RAD51C*, RAD51D*, RB1, RET, SDHA, SDHAF2, SDHB, SDHC, SDHD, SMAD4, SMARCA4, SMARCB1, SMARCE1, STK11, SUFU, TMEM127, TP53*, TSC1, TSC2, and VHL (sequencing and deletion/duplication); EGFR, EGLN1, HOXB13, KIT, MITF, PDGFRA, POLD1, and POLE (sequencing only); EPCAM and GREM1 (deletion/duplication only). DNA and RNA analyses performed for * genes.   Based on Nicholas Paul personal and family history of cancer, he meets medical criteria for genetic testing. Despite that he meets criteria, he may still have an out of pocket cost. We discussed that if his out of pocket cost for testing is over $100, the laboratory will call and confirm whether he wants to proceed with testing.  If the out of pocket cost of testing is less than $100 he will be billed by the genetic testing laboratory.   We discussed that some people do not want to undergo genetic testing due to fear of genetic discrimination.  The Genetic Information Nondiscrimination Act (GINA) was signed into federal law in 2008. GINA prohibits health insurers and most employers from discriminating against individuals based on genetic information (including the results of genetic tests and family history information). According to GINA, health insurance companies cannot consider genetic information to be a preexisting condition, nor can they use it to make decisions regarding coverage or rates. GINA also makes it illegal for most employers to use genetic information in making  decisions about hiring, firing, promotion, or terms of employment. It is important to note that GINA does not offer protections for life insurance, disability insurance, or long-term care insurance. GINA does not apply to those in the [employer], those who work for companies with less than 15 employees, and new life insurance or long-term disability insurance policies.  Health status due to a cancer diagnosis is not protected under GINA. More information about GINA can be found by visiting [email or web address].    PLAN: After considering the risks, benefits, and limitations, Nicholas Paul provided informed consent to pursue genetic testing and the blood sample was sent to [facility] for analysis of the CancerNext-Expanded+RNAinsight. Results should be available within approximately 2-3 weeks' time, at which point they will be disclosed by telephone to Nicholas Paul, as will any additional recommendations warranted by these results. Nicholas Paul will receive a summary of his genetic counseling visit and a copy of his results once available. This information will also be available in Epic.   Based on Nicholas Paul family history, we recommended his brother, who was diagnosed with bladder cancer at age 46, have genetic counseling and testing. Nicholas Paul will let us know if we can be of any assistance in coordinating genetic counseling and/or testing for this family member.   Lastly, we encouraged Nicholas Paul to remain in contact with cancer genetics annually so that we can continuously update the family history and inform him of any changes in cancer genetics and testing that may be of benefit for this family.   Nicholas Paul questions were answered to his satisfaction today. Our contact information was provided should additional questions or concerns arise. Thank you for the referral and allowing [place] to share in the care of your patient.   Esteen Delpriore P. Lowell Guitar, MS, [facility] Licensed, Research officer, political party [email or web address] phone: [phone]  The patient was seen for a total of  35 minutes in face-to-face genetic counseling.  The patient was seen alone.  Drs. Meliton Rattan, and/or [place] were available for questions, if needed..    _______________________________________________________________________ For Office Staff:  Number of people involved in session: 1 Was an Intern/ student involved with case: no

## 2023-02-15 DIAGNOSIS — C61 Malignant neoplasm of prostate: Secondary | ICD-10-CM | POA: Diagnosis not present

## 2023-02-15 DIAGNOSIS — C7951 Secondary malignant neoplasm of bone: Secondary | ICD-10-CM | POA: Diagnosis not present

## 2023-02-18 DIAGNOSIS — C61 Malignant neoplasm of prostate: Secondary | ICD-10-CM | POA: Diagnosis not present

## 2023-02-19 NOTE — Assessment & Plan Note (Signed)
Currently on amlodipine, hydrochlorothiazide and benazepril.  Monitor BP at home.

## 2023-02-19 NOTE — Assessment & Plan Note (Signed)
Supportive baseline bone mineral density study can be ordered in the upcoming visit and then every 2 years calcium (1000-1200 mg daily from food and supplements) and vitamin D3 (1000 IU daily) Zometa (5 mg IV annually) for osteopenia (T-score between -1.0 and -2.5) on ADT after dental clearance. Recommend him to see his dentist.  Healthy diet to prevent diabetes Weight-bearing exercises (30 minutes per day) Limit alcohol consumption and avoid smoking He should continue to see his cardiology team to optimize his cardiac condition while on treatment for active prostate cancer

## 2023-02-19 NOTE — Progress Notes (Signed)
RN received call from patient informing that he did have consult at [facility] and would like to pursue systemic therapy here at [facility].  RN placed request to schedule patient for Dr. Cherly Hensen to initiate treatment recommendations.  RN left message with patient for call back to review next steps.

## 2023-02-19 NOTE — Progress Notes (Signed)
Patient Care Team: [name], MD as PCP - General (Internal Medicine) Jens Som Madolyn Frieze, MD as PCP - Cardiology (Cardiology) Bebe Shaggy., MD as Consulting Physician (Urology) [facility] [place] as Consulting Physician (Optometry) Cherlyn Cushing, RN as Oncology Nurse Navigator  Clinic Day:  02/21/2023  Referring physician: [name], MD  ASSESSMENT & PLAN:   Assessment & Plan: 72 y.o.man with h/o hypertension, hyperlipidemia, BPH here for follow up for metastatic prostate cancer.  He has excellent performance status despite his age.   Current diagnosis: mHSPC.  Initial diagnosis: mHSPC. PSA 12.4 with GG5 (Gleason 4+5=9). High volume disease with nonregional and regional lymph nodes, and 4 sites of bone metastases. Treatment: 12/2022. Started relugolix and 01/17/23 darolutamide .  Prostate cancer metastatic to bone [facility]) We discussed docetaxel chemotherapy.  Potential side effect including fatigue, neutropenia, febrile neutropenia, thrombocytopenia, anemia, alopecia, skin rash, loss of nail and potentially infection, hypersensitivity, fluid retention with edema, diarrhea, nausea, vomiting, mouth sores, liver enzyme elevation, neuropathy, muscle pain, joint pain, visual change and rarely severe allergic reaction, skin rash, severe infection resulting in sepsis, and potential death.  Discussed the importance of recognizing fever, signs of infection while on treatment. Proceed to ED for emergency evaluation in the setting of fever, infection. Severe infection resulting in sepsis can be life threatening and result in death if not treated early.  Proceed with docetaxel every 3 weeks. Continue Orgovyx and darolutamide Follow up every 3 weeks with lab visit. Monitor for signs of infection and neutropenic fever on docetaxel.  At risk for side effect of medication Supportive baseline bone mineral density study can be ordered in the upcoming visit and then every 2  years calcium (1000-1200 mg daily from food and supplements) and vitamin D3 (1000 IU daily) Zometa (5 mg IV annually) for osteopenia (T-score between -1.0 and -2.5) on ADT after dental clearance. Recommend him to see his dentist.  Healthy diet to prevent diabetes Weight-bearing exercises (30 minutes per day) Limit alcohol consumption and avoid smoking He should continue to see his cardiology team to optimize his cardiac condition while on treatment for active prostate cancer  Essential hypertension Currently on amlodipine, hydrochlorothiazide and benazepril.  Monitor BP at home.  The patient understands the plans discussed today and is in agreement with them.  He knows to contact our office if he develops concerns prior to his next appointment.   Melven Sartorius, MD  [facility] AT [facility] [address] [place] [address] Dept: [phone] Dept Fax: [phone]   Orders Placed This Encounter  Procedures   Consent Attestation for Oncology Treatment    Order Specific Question:   The patient is informed of risks, benefits, side-effects of the prescribed oncology treatment. Potential short term and long term side effects and response rates discussed. After a long discussion, the patient made informed decision to proceed.    Answer:   Yes   IR IMAGING GUIDED PORT INSERTION    Standing Status:   Future    Standing Expiration Date:   02/21/2024    Order Specific Question:   Reason for Exam (SYMPTOM  OR DIAGNOSIS REQUIRED)    Answer:   mediport for chemo    Order Specific Question:   Preferred Imaging Location?    Answer:   [facility]   PSA, total and free    Standing Status:   Future    Standing Expiration Date:  02/29/2024   Testosterone    Standing Status:   Future    Standing Expiration Date:   02/29/2024   CBC with Differential (Cancer Center Only)    Standing Status:   Future    Standing Expiration  Date:   02/29/2024   CMP (Cancer Center only)    Standing Status:   Future    Standing Expiration Date:   02/29/2024   PHYSICIAN COMMUNICATION ORDER    Pathway POS96 x 6 cycles with ADT.      CHIEF COMPLAINT:  CC: Prostate cancer  Current Treatment: Pending start  INTERVAL HISTORY:  Nicholas Paul is here today for repeat clinical assessment. Overall he feels well.  He denies any new bone pain, back pain.  No difficulty with urination.  I have reviewed the past medical history, past surgical history, social history and family history with the patient and they are unchanged from previous note.  Past Medical History:  Diagnosis Date   BPH (benign prostatic hyperplasia)    urologist--- dr Mena Goes   Coronary artery disease cardiologist--- Lawson Fiscal gerhardt NP   last CTA 02-17-2018  nonobstructive proximal and mid LAD, calcium score 15;   nuclear stress test 06-09-2003 in epic,  no ischemia w/ ef 59%   Family history of bladder cancer    Family history of prostate cancer    GERD (gastroesophageal reflux disease)    H/O: rheumatic fever    noted after tonsillectomy   Head injury, closed, initial encounter 03/26/2019   Hyperlipidemia    Hypertension    followed by cardiology   IBS (irritable bowel syndrome)    Dr Leone Payor   Internal hemorrhoids    Pulmonary nodule    4 mm RML nodule on CT scan in 12/12. Needs follow up in one year.       ALLERGIES:  is allergic to doxazosin mesylate and crestor [rosuvastatin].  MEDICATIONS:  Current Outpatient Medications  Medication Sig Dispense Refill   amLODipine (NORVASC) 5 MG tablet TAKE 1 TABLET (5 MG TOTAL) BY MOUTH DAILY. 90 tablet 3   aspirin EC 81 MG tablet Take 81 mg by mouth daily.     atorvastatin (LIPITOR) 10 MG tablet TAKE 1 TABLET BY MOUTH EVERY DAY 90 tablet 3   benazepril (LOTENSIN) 40 MG tablet TAKE 1 TABLET BY MOUTH EVERY DAY 90 tablet 3   Cholecalciferol (VITAMIN D) 125 MCG (5000 UT) CAPS Take 125 mcg by mouth daily.       Coenzyme Q10 (CO Q 10 PO) Take 1 capsule by mouth daily.     darolutamide (NUBEQA) 300 MG tablet Take 600 mg by mouth 2 (two) times daily with a meal.     famotidine (PEPCID) 10 MG tablet Take 10 mg by mouth as needed for heartburn or indigestion.     hydrochlorothiazide (MICROZIDE) 12.5 MG capsule TAKE 1 CAPSULE BY MOUTH EVERY DAY 90 capsule 2   Naproxen Sod-diphenhydrAMINE (ALEVE PM) 220-25 MG TABS Take 1 tablet by mouth as needed.      Omega-3 Fatty Acids (OMEGA 3 PO) Take 1 capsule by mouth daily.      psyllium (METAMUCIL) 58.6 % packet Take 2 packets by mouth 2 (two) times daily.      relugolix (ORGOVYX) 120 MG tablet Take 120 mg by mouth daily.     dexamethasone (DECADRON) 4 MG tablet Take 2 tabs by mouth 2 times daily starting day before chemo. Then take 2 tabs daily for 2 days starting day after chemo. Take with food. 30  tablet 1   lidocaine-prilocaine (EMLA) cream Apply to affected area once 30 g 3   ondansetron (ZOFRAN) 8 MG tablet Take 1 tablet (8 mg total) by mouth every 8 (eight) hours as needed for nausea or vomiting. 30 tablet 1   prochlorperazine (COMPAZINE) 10 MG tablet Take 1 tablet (10 mg total) by mouth every 6 (six) hours as needed for nausea or vomiting. 30 tablet 1   No current facility-administered medications for this visit.    HISTORY OF PRESENT ILLNESS:   Oncology History  Prostate cancer metastatic to bone (HCC)  07/15/2017 Tumor Marker   PSA 1.89   09/27/2017 Tumor Marker   PSA 2.26   11/06/2018 Tumor Marker   PSA 3.04   04/22/2019 Tumor Marker   PSA 2.97   12/23/2019 Tumor Marker   PSA 1.76   12/15/2020 Tumor Marker   PSA 2.43   12/18/2021 Tumor Marker   PSA 3.76   11/27/2022 Surgery   TURP for BPH with Lower urinary tract symptoms. High risk T1b GG5 (Gleason 4+5=9) in 30% of resected tissue.   12/24/2022 Tumor Marker   PSA 12.4   01/01/2023 PET scan   PSMA PET Radiotracer avid glandular noted on the right aspect of the prostate.  Potential more  central activity difficult to separate from the urine activity. Focal radiotracer activity within the right seminal vesicle consistent with prostate adenocarcinoma metastases Radiotracer avid right and left iliac lymph nodes consistent with nodal metastases.  Radiotracer avid periaortic retroperitoneal lymph node consistent with nodal metastasis Skeletal metastases including sternum, L3 vertebral body, and left iliac bone.   01/09/2023 -  Chemotherapy   Started Orgovyx 01/17/23 darolutamide   01/17/2023 Initial Diagnosis   Prostate cancer metastatic to bone (HCC)   01/18/2023 Cancer Staging   Staging form: Prostate, AJCC 8th Edition - Clinical: Stage IVB (cT1b, cN1, cM1b, PSA: 12.4, Grade Group: 5) - Signed by Melven Sartorius, MD on 01/18/2023 Prostate specific antigen (PSA) range: 10 to 19 Gleason primary pattern: 4 Gleason secondary pattern: 5 Histologic grading system: 5 grade system   03/01/2023 -  Chemotherapy   Patient is on Treatment Plan : PROSTATE Docetaxel (75) q21d         REVIEW OF SYSTEMS:  Review of system as above  VITALS:  Blood pressure (!) 151/90, pulse 76, temperature (!) 97.3 F (36.3 C), temperature source Oral, resp. rate 18, weight 174 lb 14.4 oz (79.3 kg), SpO2 99%.  Wt Readings from Last 3 Encounters:  02/21/23 174 lb 14.4 oz (79.3 kg)  01/29/23 169 lb 12.8 oz (77 kg)  01/18/23 174 lb 1.6 oz (79 kg)    Body mass index is 25.46 kg/m.  Performance status (ECOG): 0 - Asymptomatic  PHYSICAL EXAM:   GENERAL:alert, no distress and comfortable  LABORATORY DATA:  I have reviewed the data as listed    Component Value Date/Time   NA 136 11/05/2022 1340   NA 137 12/30/2019 0944   K 3.9 11/05/2022 1340   CL 101 11/05/2022 1340   CO2 30 11/05/2022 1340   GLUCOSE 120 (H) 11/05/2022 1340   BUN 13 11/05/2022 1340   BUN 10 12/30/2019 0944   CREATININE 0.89 11/05/2022 1340   CALCIUM 9.5 11/05/2022 1340   PROT 7.5 11/05/2022 1340   PROT 6.9 04/21/2021 0943    ALBUMIN 4.4 11/05/2022 1340   ALBUMIN 4.5 04/21/2021 0943   AST 23 11/05/2022 1340   ALT 20 11/05/2022 1340   ALKPHOS 71 11/05/2022 1340  BILITOT 1.5 (H) 11/05/2022 1340   BILITOT 1.0 04/21/2021 0943   GFRNONAA 74 12/30/2019 0944   GFRAA 86 12/30/2019 0944    No results found for: "SPEP", "UPEP"  Lab Results  Component Value Date   WBC 6.4 11/05/2022   NEUTROABS 4.2 11/05/2022   HGB 13.9 11/05/2022   HCT 41.2 11/05/2022   MCV 89.6 11/05/2022   PLT 248.0 11/05/2022      Chemistry      Component Value Date/Time   NA 136 11/05/2022 1340   NA 137 12/30/2019 0944   K 3.9 11/05/2022 1340   CL 101 11/05/2022 1340   CO2 30 11/05/2022 1340   BUN 13 11/05/2022 1340   BUN 10 12/30/2019 0944   CREATININE 0.89 11/05/2022 1340      Component Value Date/Time   CALCIUM 9.5 11/05/2022 1340   ALKPHOS 71 11/05/2022 1340   AST 23 11/05/2022 1340   ALT 20 11/05/2022 1340   BILITOT 1.5 (H) 11/05/2022 1340   BILITOT 1.0 04/21/2021 0943       RADIOGRAPHIC STUDIES: I have personally reviewed the radiological images as listed and agreed with the findings in the report. No results found.

## 2023-02-19 NOTE — Assessment & Plan Note (Addendum)
We discussed docetaxel chemotherapy.  Potential side effect including fatigue, neutropenia, febrile neutropenia, thrombocytopenia, anemia, alopecia, skin rash, loss of nail and potentially infection, hypersensitivity, fluid retention with edema, diarrhea, nausea, vomiting, mouth sores, liver enzyme elevation, neuropathy, muscle pain, joint pain, visual change and rarely severe allergic reaction, skin rash, severe infection resulting in sepsis, and potential death.  Discussed the importance of recognizing fever, signs of infection while on treatment. Proceed to ED for emergency evaluation in the setting of fever, infection. Severe infection resulting in sepsis can be life threatening and result in death if not treated early.  Proceed with docetaxel every 3 weeks. Continue Orgovyx and darolutamide Follow up every 3 weeks with lab visit. Monitor for signs of infection and neutropenic fever on docetaxel.

## 2023-02-21 ENCOUNTER — Inpatient Hospital Stay (HOSPITAL_BASED_OUTPATIENT_CLINIC_OR_DEPARTMENT_OTHER): Payer: Medicare Other

## 2023-02-21 ENCOUNTER — Inpatient Hospital Stay: Payer: Medicare Other

## 2023-02-21 ENCOUNTER — Telehealth: Payer: Self-pay

## 2023-02-21 VITALS — BP 151/90 | HR 76 | Temp 97.3°F | Resp 18 | Wt 174.9 lb

## 2023-02-21 DIAGNOSIS — Z5111 Encounter for antineoplastic chemotherapy: Secondary | ICD-10-CM | POA: Diagnosis not present

## 2023-02-21 DIAGNOSIS — Z9189 Other specified personal risk factors, not elsewhere classified: Secondary | ICD-10-CM

## 2023-02-21 DIAGNOSIS — I10 Essential (primary) hypertension: Secondary | ICD-10-CM | POA: Diagnosis not present

## 2023-02-21 DIAGNOSIS — C7951 Secondary malignant neoplasm of bone: Secondary | ICD-10-CM | POA: Diagnosis not present

## 2023-02-21 DIAGNOSIS — C61 Malignant neoplasm of prostate: Secondary | ICD-10-CM

## 2023-02-21 DIAGNOSIS — Z79899 Other long term (current) drug therapy: Secondary | ICD-10-CM | POA: Diagnosis not present

## 2023-02-21 DIAGNOSIS — Z23 Encounter for immunization: Secondary | ICD-10-CM

## 2023-02-21 DIAGNOSIS — Z5189 Encounter for other specified aftercare: Secondary | ICD-10-CM | POA: Diagnosis not present

## 2023-02-21 MED ORDER — ONDANSETRON HCL 8 MG PO TABS: 8.0000 mg | ORAL_TABLET | Freq: Three times a day (TID) | ORAL | 1 refills | Status: DC | PRN

## 2023-02-21 MED ORDER — PROCHLORPERAZINE MALEATE 10 MG PO TABS: 10.0000 mg | ORAL_TABLET | Freq: Four times a day (QID) | ORAL | 1 refills | Status: DC | PRN

## 2023-02-21 MED ORDER — INFLUENZA VAC A&B SURF ANT ADJ 0.5 ML IM SUSY
0.5000 mL | PREFILLED_SYRINGE | Freq: Once | INTRAMUSCULAR | Status: AC
  Administered 2023-02-21: 0.5 mL via INTRAMUSCULAR
  Filled 2023-02-21: qty 0.5

## 2023-02-21 MED ORDER — DEXAMETHASONE 4 MG PO TABS: ORAL_TABLET | ORAL | 1 refills | Status: DC

## 2023-02-21 MED ORDER — LIDOCAINE-PRILOCAINE 2.5-2.5 % EX CREA: TOPICAL_CREAM | CUTANEOUS | 3 refills | Status: DC

## 2023-02-21 NOTE — Patient Instructions (Addendum)
You should get a call to place a mediport.  We will schedule you for chemotherapy treatment. Please see [place] one day before each treatment with lab and then office visit starting cycle 2. Take compazine twice daily preventatively on days 1-3 for nausea. If no nausea, on day 4, take as needed. Take dexamethasone, (steroid) on the day before and after chemotherapy.

## 2023-02-21 NOTE — Telephone Encounter (Signed)
Both patient and patient's spouse are aware of Patient education appointment times/dates

## 2023-02-21 NOTE — Progress Notes (Signed)
START ON PATHWAY REGIMEN - Prostate     Darolutamide: A cycle is every 28 days:     Darolutamide    Docetaxel cycles 1 through 6: A cycle is every 21 days:     Docetaxel   **Always confirm dose/schedule in your pharmacy ordering system**  Patient Characteristics: Adenocarcinoma, Recurrent/New Systemic Disease (Including Biochemical Recurrence), Non-Castrate, M1 Histology: Adenocarcinoma Therapeutic Status: Recurrent/New Systemic Disease (Including Biochemical Recurrence) Intent of Therapy: Non-Curative / Palliative Intent, Discussed with Patient 

## 2023-02-22 DIAGNOSIS — C61 Malignant neoplasm of prostate: Secondary | ICD-10-CM | POA: Diagnosis not present

## 2023-02-23 ENCOUNTER — Other Ambulatory Visit: Payer: Self-pay

## 2023-02-28 NOTE — Progress Notes (Signed)
Pharmacist Chemotherapy Monitoring - Initial Assessment    Anticipated start date: 03/08/23   The following has been reviewed per standard work regarding the patient's treatment regimen: The patient's diagnosis, treatment plan and drug doses, and organ/hematologic function Lab orders and baseline tests specific to treatment regimen  The treatment plan start date, drug sequencing, and pre-medications Prior authorization status  Patient's documented medication list, including drug-drug interaction screen and prescriptions for anti-emetics and supportive care specific to the treatment regimen The drug concentrations, fluid compatibility, administration routes, and timing of the medications to be used The patient's access for treatment and lifetime cumulative dose history, if applicable  The patient's medication allergies and previous infusion related reactions, if applicable   Changes made to treatment plan:  N/A  Follow up needed:  N/A   Demetrius Charity, RPH, 02/28/2023  11:36 AM '

## 2023-03-02 ENCOUNTER — Other Ambulatory Visit: Payer: Self-pay | Admitting: Cardiology

## 2023-03-02 DIAGNOSIS — E7849 Other hyperlipidemia: Secondary | ICD-10-CM

## 2023-03-02 DIAGNOSIS — I2583 Coronary atherosclerosis due to lipid rich plaque: Secondary | ICD-10-CM

## 2023-03-04 ENCOUNTER — Ambulatory Visit: Payer: Medicare Other | Attending: Nurse Practitioner | Admitting: Nurse Practitioner

## 2023-03-04 ENCOUNTER — Other Ambulatory Visit: Payer: Self-pay | Admitting: Physician Assistant

## 2023-03-04 ENCOUNTER — Telehealth: Payer: Self-pay | Admitting: Genetic Counselor

## 2023-03-04 ENCOUNTER — Ambulatory Visit: Payer: Self-pay | Admitting: Genetic Counselor

## 2023-03-04 ENCOUNTER — Encounter: Payer: Self-pay | Admitting: Nurse Practitioner

## 2023-03-04 ENCOUNTER — Encounter: Payer: Self-pay | Admitting: Genetic Counselor

## 2023-03-04 VITALS — BP 112/72 | HR 87 | Ht 69.0 in | Wt 174.8 lb

## 2023-03-04 DIAGNOSIS — C61 Malignant neoplasm of prostate: Secondary | ICD-10-CM | POA: Insufficient documentation

## 2023-03-04 DIAGNOSIS — L821 Other seborrheic keratosis: Secondary | ICD-10-CM | POA: Diagnosis not present

## 2023-03-04 DIAGNOSIS — D692 Other nonthrombocytopenic purpura: Secondary | ICD-10-CM | POA: Diagnosis not present

## 2023-03-04 DIAGNOSIS — C7951 Secondary malignant neoplasm of bone: Secondary | ICD-10-CM | POA: Diagnosis not present

## 2023-03-04 DIAGNOSIS — I2510 Atherosclerotic heart disease of native coronary artery without angina pectoris: Secondary | ICD-10-CM | POA: Insufficient documentation

## 2023-03-04 DIAGNOSIS — L570 Actinic keratosis: Secondary | ICD-10-CM | POA: Diagnosis not present

## 2023-03-04 DIAGNOSIS — L820 Inflamed seborrheic keratosis: Secondary | ICD-10-CM | POA: Diagnosis not present

## 2023-03-04 DIAGNOSIS — Z01818 Encounter for other preprocedural examination: Secondary | ICD-10-CM

## 2023-03-04 DIAGNOSIS — Z1379 Encounter for other screening for genetic and chromosomal anomalies: Secondary | ICD-10-CM

## 2023-03-04 DIAGNOSIS — I10 Essential (primary) hypertension: Secondary | ICD-10-CM | POA: Insufficient documentation

## 2023-03-04 DIAGNOSIS — E785 Hyperlipidemia, unspecified: Secondary | ICD-10-CM | POA: Diagnosis not present

## 2023-03-04 DIAGNOSIS — Z85828 Personal history of other malignant neoplasm of skin: Secondary | ICD-10-CM | POA: Diagnosis not present

## 2023-03-04 NOTE — Consult Note (Signed)
Chief Complaint: Patient was seen in consultation today for  port a cath placement   Referring Physician(s): Chang,Rubens C  Supervising Physician: Roanna Banning  Patient Status: [facility] - Out-pt  History of Present Illness: Nicholas Paul is a 72 y.o. male with PMH sig for BPH, CAD, GERD, HLD,HTN, IBS who presents now with metastatic prostate cancer. He is scheduled today for port a cath placement to assist with treatment.   Past Medical History:  Diagnosis Date   BPH (benign prostatic hyperplasia)    urologist--- dr Mena Goes   Coronary artery disease cardiologist--- Lawson Fiscal gerhardt NP   last CTA 02-17-2018  nonobstructive proximal and mid LAD, calcium score 15;   nuclear stress test 06-09-2003 in epic,  no ischemia w/ ef 59%   Family history of bladder cancer    Family history of prostate cancer    GERD (gastroesophageal reflux disease)    H/O: rheumatic fever    noted after tonsillectomy   Head injury, closed, initial encounter 03/26/2019   Hyperlipidemia    Hypertension    followed by cardiology   IBS (irritable bowel syndrome)    Dr Leone Payor   Internal hemorrhoids    Pulmonary nodule    4 mm RML nodule on CT scan in 12/12. Needs follow up in one year.       Past Surgical History:  Procedure Laterality Date   COLONOSCOPY W/ POLYPECTOMY  2006   internal hemorrhoids 2011   PROSTATE BIOPSY     SHOULDER ARTHROSCOPY WITH OPEN ROTATOR CUFF REPAIR Right 2022   THULIUM LASER TURP (TRANSURETHRAL RESECTION OF PROSTATE) N/A 07/14/2019   Procedure: THULIUM LASER TURP (TRANSURETHRAL RESECTION OF PROSTATE);  Surgeon: Jerilee Field, MD;  Location: [facility];  Service: Urology;  Laterality: N/A;   TONSILLECTOMY AND ADENOIDECTOMY      Allergies: Doxazosin mesylate and Crestor [rosuvastatin]  Medications: Prior to Admission medications   Medication Sig Start Date End Date Taking? Authorizing Provider  amLODipine (NORVASC) 5 MG tablet TAKE 1 TABLET (5 MG  TOTAL) BY MOUTH DAILY. 04/17/22   Lewayne Bunting, MD  aspirin EC 81 MG tablet Take 81 mg by mouth daily.    [provider]  atorvastatin (LIPITOR) 10 MG tablet TAKE 1 TABLET BY MOUTH EVERY DAY 03/04/23   Lewayne Bunting, MD  benazepril (LOTENSIN) 40 MG tablet TAKE 1 TABLET BY MOUTH EVERY DAY 06/13/22   Lewayne Bunting, MD  Cholecalciferol (VITAMIN D) 125 MCG (5000 UT) CAPS Take 125 mcg by mouth daily.  08/25/18   [provider]  Coenzyme Q10 (CO Q 10 PO) Take 1 capsule by mouth daily.    [provider]  darolutamide (NUBEQA) 300 MG tablet Take 600 mg by mouth 2 (two) times daily with a meal.    [provider]  dexamethasone (DECADRON) 4 MG tablet Take 2 tabs by mouth 2 times daily starting day before chemo. Then take 2 tabs daily for 2 days starting day after chemo. Take with food. 02/21/23   Melven Sartorius, MD  famotidine (PEPCID) 10 MG tablet Take 10 mg by mouth as needed for heartburn or indigestion.    [provider]  hydrochlorothiazide (MICROZIDE) 12.5 MG capsule TAKE 1 CAPSULE BY MOUTH EVERY DAY 08/27/22   Lewayne Bunting, MD  lidocaine-prilocaine (EMLA) cream Apply to affected area once 02/21/23   Melven Sartorius, MD  Naproxen Sod-diphenhydrAMINE (ALEVE PM) 220-25 MG TABS Take 1 tablet by mouth as needed.  [provider]  Omega-3 Fatty Acids (OMEGA 3 PO) Take 1 capsule by mouth daily.     [provider]  ondansetron (ZOFRAN) 8 MG tablet Take 1 tablet (8 mg total) by mouth every 8 (eight) hours as needed for nausea or vomiting. 02/21/23   Melven Sartorius, MD  prochlorperazine (COMPAZINE) 10 MG tablet Take 1 tablet (10 mg total) by mouth every 6 (six) hours as needed for nausea or vomiting. 02/21/23   Melven Sartorius, MD  psyllium (METAMUCIL) 58.6 % packet Take 2 packets by mouth 2 (two) times daily.     [provider]  relugolix (ORGOVYX) 120 MG tablet Take 120 mg by mouth daily.    [provider]     Family History  Problem Relation Age of Onset   Hyperlipidemia Mother    Hypertension Mother    Bladder Cancer Father 26       heavy smoker   Bladder Cancer Brother        dx. 30s, non-smoker   Hypertension Brother    Brain cancer Maternal Uncle 16       glioblastoma   Bladder Cancer Paternal Aunt    Prostate cancer Paternal Grandfather        d. 54s   Prostate cancer Cousin        mat first cousin   Stroke Neg Hx    Diabetes Neg Hx    Heart attack Neg Hx    Colon cancer Neg Hx    Colon polyps Neg Hx    Esophageal cancer Neg Hx    Rectal cancer Neg Hx    Stomach cancer Neg Hx     Social History   Socioeconomic History   Marital status: Married    Spouse name: Not on file   Number of children: 2   Years of education: Not on file   Highest education level: Some college, no degree  Occupational History    Employer: [employer]  Tobacco Use   Smoking status: Never   Smokeless tobacco: Never  Vaping Use   Vaping status: Never Used  Substance and Sexual Activity   Alcohol use: Yes    Comment: occasional   Drug use: Never   Sexual activity: Yes    Comment: vasectomy  Other Topics Concern   Not on file  Social History Narrative   ** Merged History Encounter **       Social Determinants of Health   Financial Resource Strain: Low Risk  (01/31/2023)   Received from [facility]   Overall Financial Resource Strain (CARDIA)    Difficulty of Paying Living Expenses: Not hard at all  Food Insecurity: No Food Insecurity (01/31/2023)   Received from [facility]   Hunger Vital Sign    Worried About Running Out of Food in the Last Year: Never true    Ran Out of Food in the Last Year: Never true  Transportation Needs: No Transportation Needs (01/29/2023)   PRAPARE - Administrator, Civil Service (Medical): No    Lack of Transportation (Non-Medical): No  Physical Activity: Sufficiently Active (09/15/2022)   Exercise Vital Sign    Days of  Exercise per Week: 2 days    Minutes of Exercise per Session: 150+ min  Stress: No Stress Concern Present (09/15/2022)   [employer] of Occupational Health - Occupational Stress Questionnaire    Feeling of Stress : Not at all  Social Connections: Moderately Integrated (09/15/2022)  Social Advertising account executive [NHANES]    Frequency of Communication with Friends and Family: Once a week    Frequency of Social Gatherings with Friends and Family: Once a week    Attends Religious Services: More than 4 times per year    Active Member of [employer] or Organizations: Yes    Attends Banker Meetings: 1 to 4 times per year    Marital Status: Married      Review of Systems; denies fever,HA,CP,dyspnea, cough, abd /back pain,N/V or bleeding  Vital Signs: Vitals:   03/05/23 0803  BP: 138/84  Pulse: 82  Resp: 18  Temp: 97.8 F (36.6 C)  SpO2: 97%     Code Status: FULL CODE  Advance Care Plan: no documents on file    Physical Exam: awake/alert; chest- CTA bilat; heart- nl rate, sinus arrhythmia; abd-soft,+BS,NT; no LE edema  Imaging: No results found.  Labs:  CBC: Recent Labs    11/05/22 1340  WBC 6.4  HGB 13.9  HCT 41.2  PLT 248.0    COAGS: No results for input(s): "INR", "APTT" in the last 8760 hours.  BMP: Recent Labs    11/05/22 1340  NA 136  K 3.9  CL 101  CO2 30  GLUCOSE 120*  BUN 13  CALCIUM 9.5  CREATININE 0.89    LIVER FUNCTION TESTS: Recent Labs    11/05/22 1340  BILITOT 1.5*  AST 23  ALT 20  ALKPHOS 71  PROT 7.5  ALBUMIN 4.4    TUMOR MARKERS: No results for input(s): "AFPTM", "CEA", "CA199", "CHROMGRNA" in the last 8760 hours.  Assessment and Plan: 72 y.o. male with PMH sig for BPH, CAD, GERD, HLD,HTN, IBS who presents now with metastatic prostate cancer. He is scheduled today for port a cath placement to assist with treatment. Risks and benefits of image guided port-a-catheter placement was discussed with the  patient/spouse  including, but not limited to bleeding, infection, pneumothorax, or fibrin sheath development and need for additional procedures.  All of the patient's questions were answered, patient is agreeable to proceed. Consent signed and in chart.    Thank you for this interesting consult.  I greatly enjoyed meeting ONESIMUS FOUSEK and look forward to participating in their care.  A copy of this report was sent to the requesting provider on this date.  Electronically Signed: D. Jeananne Rama, PA-C 03/04/2023, 4:12 PM   I spent a total of 25 minutes   in face to face in clinical consultation, greater than 50% of which was counseling/coordinating care for port a cath placement

## 2023-03-04 NOTE — Patient Instructions (Signed)
Medication Instructions:  Your physician recommends that you continue on your current medications as directed. Please refer to the Current Medication list given to you today.  *If you need a refill on your cardiac medications before your next appointment, please call your pharmacy*   Lab Work: NONE ordered at this time of appointment    Testing/Procedures: NONE ordered at this time of appointment     Follow-Up: At [facility], you and your health needs are our priority.  As part of our continuing mission to provide you with exceptional heart care, we have created designated Provider Care Teams.  These Care Teams include your primary Cardiologist (physician) and Advanced Practice Providers (APPs -  Physician Assistants and Nurse Practitioners) who all work together to provide you with the care you need, when you need it.  We recommend signing up for the patient portal called "MyChart".  Sign up information is provided on this After Visit Summary.  MyChart is used to connect with patients for Virtual Visits (Telemedicine).  Patients are able to view lab/test results, encounter notes, upcoming appointments, etc.  Non-urgent messages can be sent to your provider as well.   To learn more about what you can do with MyChart, go to [email or web address].    Your next appointment:   6 month(s)  Provider:   Olga Millers, MD

## 2023-03-04 NOTE — Telephone Encounter (Signed)
Revealed negative genetic testing.  Discussed that we do not know why he has prostate cancer or why there is cancer in the family. It could be due to a different gene that we are not testing, or maybe our current technology may not be able to pick something up.  It will be important for him to keep in contact with genetics to keep up with whether additional testing may be needed.   2

## 2023-03-04 NOTE — Progress Notes (Signed)
HPI:  Nicholas Paul was previously seen in the [facility] due to a personal and family history of cancer and concerns regarding a hereditary predisposition to cancer. Please refer to our prior cancer genetics clinic note for more information regarding our discussion, assessment and recommendations, at the time. Nicholas Paul recent genetic test results were disclosed to him, as were recommendations warranted by these results. These results and recommendations are discussed in more detail below.  CANCER HISTORY:  Oncology History  Prostate cancer metastatic to bone (HCC)  07/15/2017 Tumor Marker   PSA 1.89   09/27/2017 Tumor Marker   PSA 2.26   11/06/2018 Tumor Marker   PSA 3.04   04/22/2019 Tumor Marker   PSA 2.97   12/23/2019 Tumor Marker   PSA 1.76   12/15/2020 Tumor Marker   PSA 2.43   12/18/2021 Tumor Marker   PSA 3.76   11/27/2022 Surgery   TURP for BPH with Lower urinary tract symptoms. High risk T1b GG5 (Gleason 4+5=9) in 30% of resected tissue.   12/24/2022 Tumor Marker   PSA 12.4   01/01/2023 PET scan   PSMA PET Radiotracer avid glandular noted on the right aspect of the prostate.  Potential more central activity difficult to separate from the urine activity. Focal radiotracer activity within the right seminal vesicle consistent with prostate adenocarcinoma metastases Radiotracer avid right and left iliac lymph nodes consistent with nodal metastases.  Radiotracer avid periaortic retroperitoneal lymph node consistent with nodal metastasis Skeletal metastases including sternum, L3 vertebral body, and left iliac bone.   01/09/2023 -  Chemotherapy   Started Orgovyx 01/17/23 darolutamide   01/17/2023 Initial Diagnosis   Prostate cancer metastatic to bone (HCC)   01/18/2023 Cancer Staging   Staging form: Prostate, AJCC 8th Edition - Clinical: Stage IVB (cT1b, cN1, cM1b, PSA: 12.4, Grade Group: 5) - Signed by Melven Sartorius, MD on 01/18/2023 Prostate specific antigen  (PSA) range: 10 to 19 Gleason primary pattern: 4 Gleason secondary pattern: 5 Histologic grading system: 5 grade system   03/01/2023 Genetic Testing   Negative genetic testing on the CancerNext-Expanded+RNAinsight panel.  The report date is 03/01/2023.  The CancerNext-Expanded gene panel offered by [facility] and includes sequencing and rearrangement analysis for the following 77 genes: AIP, ALK, APC*, ATM*, AXIN2, BAP1, BARD1, BMPR1A, BRCA1*, BRCA2*, BRIP1*, CDC73, CDH1*, CDK4, CDKN1B, CDKN2A, CHEK2*, CTNNA1, DICER1, FH, FLCN, KIF1B, LZTR1, MAX, MEN1, MET, MLH1*, MSH2*, MSH3, MSH6*, MUTYH*, NF1*, NF2, NTHL1, PALB2*, PHOX2B, PMS2*, POT1, PRKAR1A, PTCH1, PTEN*, RAD51C*, RAD51D*, RB1, RET, SDHA, SDHAF2, SDHB, SDHC, SDHD, SMAD4, SMARCA4, SMARCB1, SMARCE1, STK11, SUFU, TMEM127, TP53*, TSC1, TSC2, and VHL (sequencing and deletion/duplication); EGFR, EGLN1, HOXB13, KIT, MITF, PDGFRA, POLD1, and POLE (sequencing only); EPCAM and GREM1 (deletion/duplication only). DNA and RNA analyses performed for * genes.    03/08/2023 -  Chemotherapy   Patient is on Treatment Plan : PROSTATE Docetaxel (74) q21d       FAMILY HISTORY:  We obtained a detailed, 4-generation family history.  Significant diagnoses are listed below: Family History  Problem Relation Age of Onset   Hyperlipidemia Mother    Hypertension Mother    Bladder Cancer Father 35       heavy smoker   Bladder Cancer Brother        dx. 30s, non-smoker   Hypertension Brother    Brain cancer Maternal Uncle 13       glioblastoma   Bladder Cancer Paternal Aunt    Prostate cancer Paternal Grandfather  d. 67s   Prostate cancer Cousin        mat first cousin   Stroke Neg Hx    Diabetes Neg Hx    Heart attack Neg Hx    Colon cancer Neg Hx    Colon polyps Neg Hx    Esophageal cancer Neg Hx    Rectal cancer Neg Hx    Stomach cancer Neg Hx        The patient has a son and daughter who are cancer free.  He has two brothers and a  sister.  One brother was diagnosed with bladder cancer in his early 74's. Both parent are deceased.   The patient's father had bladder cancer at 53.  He had seven siblings, one sister also had bladder cancer.  His father had prostate cancer.   The patient's mother did not have cancer.  She had two brothers, one brother died of glioblastoma and the other brother has a son with prostate cancer.   Nicholas Paul is unaware of previous family history of genetic testing for hereditary cancer risks. There is no reported Ashkenazi Jewish ancestry. There is no known consanguinity  GENETIC TEST RESULTS: Genetic testing reported out on March 01, 2023 through the CancerNext-Expanded+RNAinsight cancer panel found no pathogenic mutations. The CancerNext-Expanded gene panel offered by [facility] and includes sequencing and rearrangement analysis for the following 77 genes: AIP, ALK, APC*, ATM*, AXIN2, BAP1, BARD1, BMPR1A, BRCA1*, BRCA2*, BRIP1*, CDC73, CDH1*, CDK4, CDKN1B, CDKN2A, CHEK2*, CTNNA1, DICER1, FH, FLCN, KIF1B, LZTR1, MAX, MEN1, MET, MLH1*, MSH2*, MSH3, MSH6*, MUTYH*, NF1*, NF2, NTHL1, PALB2*, PHOX2B, PMS2*, POT1, PRKAR1A, PTCH1, PTEN*, RAD51C*, RAD51D*, RB1, RET, SDHA, SDHAF2, SDHB, SDHC, SDHD, SMAD4, SMARCA4, SMARCB1, SMARCE1, STK11, SUFU, TMEM127, TP53*, TSC1, TSC2, and VHL (sequencing and deletion/duplication); EGFR, EGLN1, HOXB13, KIT, MITF, PDGFRA, POLD1, and POLE (sequencing only); EPCAM and GREM1 (deletion/duplication only). DNA and RNA analyses performed for * genes. The test report has been scanned into EPIC and is located under the Molecular Pathology section of the Results Review tab.  A portion of the result report is included below for reference.     We discussed with Nicholas Paul that because current genetic testing is not perfect, it is possible there may be a gene mutation in one of these genes that current testing cannot detect, but that chance is small.  We also discussed, that there could  be another gene that has not yet been discovered, or that we have not yet tested, that is responsible for the cancer diagnoses in the family. It is also possible there is a hereditary cause for the cancer in the family that Nicholas Paul did not inherit and therefore was not identified in his testing.  Therefore, it is important to remain in touch with cancer genetics in the future so that we can continue to offer Nicholas Paul the most up to date genetic testing.   ADDITIONAL GENETIC TESTING: We discussed with Nicholas Paul that his genetic testing was fairly extensive.  If there are genes identified to increase cancer risk that can be analyzed in the future, we would be happy to discuss and coordinate this testing at that time.    CANCER SCREENING RECOMMENDATIONS: Nicholas Paul test result is considered negative (normal).  This means that we have not identified a hereditary cause for his personal and family history of cancer at this time. Most cancers happen by chance and this negative test suggests that his personal and family history of cancer may fall into this  category.    Possible reasons for Nicholas Paul negative genetic test include:  1. There may be a gene mutation in one of these genes that current testing methods cannot detect but that chance is small.  2. There could be another gene that has not yet been discovered, or that we have not yet tested, that is responsible for the cancer diagnoses in the family.  3.  There may be no hereditary risk for cancer in the family. The cancers in Nicholas Paul and/or his family may be sporadic/familial or due to other genetic and environmental factors. 4. It is also possible there is a hereditary cause for the cancer in the family that Nicholas Paul did not inherit.  Therefore, it is recommended he continue to follow the cancer management and screening guidelines provided by his oncology and primary healthcare provider. An individual's cancer risk and medical management  are not determined by genetic test results alone. Overall cancer risk assessment incorporates additional factors, including personal medical history, family history, and any available genetic information that may result in a personalized plan for cancer prevention and surveillance  RECOMMENDATIONS FOR FAMILY MEMBERS:  Individuals in this family might be at some increased risk of developing cancer, over the general population risk, simply due to the family history of cancer.  We recommended women in this family have a yearly mammogram beginning at age 78, or 39 years younger than the earliest onset of cancer, an annual clinical breast exam, and perform monthly breast self-exams. Women in this family should also have a gynecological exam as recommended by their primary provider. All family members should be referred for colonoscopy starting at age 41.  FOLLOW-UP: Lastly, we discussed with Nicholas Paul that cancer genetics is a rapidly advancing field and it is possible that new genetic tests will be appropriate for him and/or his family members in the future. We encouraged him to remain in contact with cancer genetics on an annual basis so we can update his personal and family histories and let him know of advances in cancer genetics that may benefit this family.   Our contact number was provided. Nicholas Paul questions were answered to his satisfaction, and he knows he is welcome to call us at anytime with additional questions or concerns.   Maylon Cos, MS, [facility] Licensed, Certified Genetic Counselor [email or web address]

## 2023-03-04 NOTE — Progress Notes (Unsigned)
Office Visit    Patient Name: Nicholas Paul Date of Encounter: 03/04/2023  Primary Care Provider:  [name], MD Primary Cardiologist:  Olga Millers, MD  Chief Complaint    72 year old male with a history of CAD, hypertension, hyperlipidemia, IBS, GERD, BPH and metastatic prostate cancer who presents for follow-up related to CAD.   Past Medical History    Past Medical History:  Diagnosis Date   BPH (benign prostatic hyperplasia)    urologist--- dr Mena Goes   Coronary artery disease cardiologist--- Lawson Fiscal gerhardt NP   last CTA 02-17-2018  nonobstructive proximal and mid LAD, calcium score 15;   nuclear stress test 06-09-2003 in epic,  no ischemia w/ ef 59%   Family history of bladder cancer    Family history of prostate cancer    GERD (gastroesophageal reflux disease)    H/O: rheumatic fever    noted after tonsillectomy   Head injury, closed, initial encounter 03/26/2019   Hyperlipidemia    Hypertension    followed by cardiology   IBS (irritable bowel syndrome)    Dr Leone Payor   Internal hemorrhoids    Pulmonary nodule    4 mm RML nodule on CT scan in 12/12. Needs follow up in one year.      Past Surgical History:  Procedure Laterality Date   COLONOSCOPY W/ POLYPECTOMY  2006   internal hemorrhoids 2011   PROSTATE BIOPSY     SHOULDER ARTHROSCOPY WITH OPEN ROTATOR CUFF REPAIR Right 2022   THULIUM LASER TURP (TRANSURETHRAL RESECTION OF PROSTATE) N/A 07/14/2019   Procedure: THULIUM LASER TURP (TRANSURETHRAL RESECTION OF PROSTATE);  Surgeon: Jerilee Field, MD;  Location: [facility];  Service: Urology;  Laterality: N/A;   TONSILLECTOMY AND ADENOIDECTOMY      Allergies  Allergies  Allergen Reactions   Doxazosin Mesylate Swelling    REACTION: edema on the generic (07-09-2019  Per pt this reaction was approx. 1990s).    Crestor [Rosuvastatin]     Crestor 5 mg qd caused leg myalgias ; no issue with Lipitor 10 mg M, W, & F     Labs/Other  Studies Reviewed    The following studies were reviewed today:  Cardiac Studies & Procedures          CT SCANS  CT CORONARY MORPH W/CTA COR W/SCORE 02/17/2018  Addendum 02/17/2018  6:38 PM ADDENDUM REPORT: 02/17/2018 18:36  CLINICAL DATA:  Chest pain  EXAM: Cardiac CTA  MEDICATIONS: Sub lingual nitro. 4mg  x 2  TECHNIQUE: The patient was scanned on a Siemens 192 slice scanner. Gantry rotation speed was 250 msecs. Collimation was 0.6 mm. A 100 kV prospective scan was triggered in the ascending thoracic aorta at 35-75% of the R-R interval. Average HR during the scan was 60 bpm. The 3D data set was interpreted on a dedicated work station using MPR, MIP and VRT modes. A total of 80cc of contrast was used.  FINDINGS: Non-cardiac: See separate report from [facility].  Pulmonary veins drain normally to the left atrium.  Calcium Score: 15 Agatston units.  Coronary Arteries: Right dominant with no anomalies  LM: No plaque or stenosis.  LAD system: Mixed plaque proximal and mid LAD without significant stenosis.  Circumflex system: The AV LCx is diminutive. LCx territory is mainly covered by diagonals off the LAD.  RCA system: No plaque or stenosis.  IMPRESSION: 1. Coronary artery calcium score 15 Agatston units. This places the patient in the 25th percentile for age and gender, suggesting low  risk for future cardiac events.  2.  Nonobstructive plaque in the proximal and mid LAD.  Dalton Mclean   Electronically Signed By: Marca Ancona M.D. On: 02/17/2018 18:36  Narrative EXAM: OVER-READ INTERPRETATION  CT CHEST  The following report is an over-read performed by radiologist Dr. Charlett Nose of [facility] on 02/17/2018. This over-read does not include interpretation of cardiac or coronary anatomy or pathology. The coronary CTA interpretation by the cardiologist is attached.  COMPARISON:  05/03/2011  FINDINGS: Vascular: Heart is  normal size.  Visualized aorta normal caliber.  Mediastinum/Nodes: No adenopathy in the lower mediastinum or hila.  Lungs/Pleura: 4 mm nodule in the anterior right middle lobe on image 20 is stable since prior study. No new pulmonary nodules. No confluent opacity or effusion.  Upper Abdomen: Imaging into the upper abdomen shows no acute findings.  Musculoskeletal: Chest wall soft tissues are unremarkable. No acute bony abnormality.  IMPRESSION: Stable 4 mm right middle lobe nodule compatible with benign nodule.  No acute or significant extracardiac abnormality.  Electronically Signed: By: Charlett Nose M.D. On: 02/17/2018 16:26   CT SCANS  CT CORONARY MORPH W/CTA COR W/SCORE 05/03/2011  Narrative *RADIOLOGY REPORT*  INDICATION:  Chest pain.  CT ANGIOGRAPHY OF THE HEART, CORONARY ARTERY, STRUCTURE, AND MORPHOLOGY  CONTRAST: 80mL OMNIPAQUE IOHEXOL 350 MG/ML IV SOLN  COMPARISON:  Chest radiograph 05/02/2011.  TECHNIQUE:  CT angiography of the coronary vessels was performed on a 256 channel system using prospective ECG gating.  A scout and noncontrast exam (for calcium scoring) were performed.  Circulation time was measured using a test bolus.  Coronary CTA was performed with sub mm slice collimation during portions of the cardiac cycle after prior injection of iodinated contrast.  Imaging post processing was performed on an independent workstation creating multiplanar and 3-D images, and quantitative analysis of the heart and coronary arteries.  Note that this exam targets the heart and the chest was not imaged in its entirety.  PREMEDICATION: Lopressor 50 mg, P.O. Lopressor five mg, IV Nitroglycerin 0.4 mcg, sublingual.  FINDINGS: Technical quality:  Excellent  Heart rate:  62  CORONARY ARTERIES: Left main coronary artery:  Widely patent. Left anterior descending:  Large left anterior descending coronary artery with a large diagonal branches.  The left  anterior descending coronary artery and associated branches appear widely patent. Left circumflex:  Distal origin of the minuscule left circumflex artery which appears patent. Right coronary artery:  The right coronary artery demonstrates a soft plaque in the proximal region with 50% stenosis. This results in 50% or slightly greater reduction of the cross-sectional area of the vessel.  There is no calcified plaque in this region.  There is post stenotic dilation of the right coronary artery distal to the lesion. Posterior descending artery:  Widely patent. Dominance:  Right  CORONARY CALCIUM: Total Agatston Score:  Zero  CARDIAC MEASUREMENTS: Interventricular septum (6 - 12 mm):  11 mm  LV posterior wall (6 - 12 mm):  11 mm LV diameter in diastole:  30 mm  AORTA AND PULMONARY MEASUREMENTS: Aortic root (21 - 40 mm): 25 mm  at the annulus 35 mm  at the sinuses of Valsalva 26 mm  at the sinotubular junction Ascending aorta ( <  40 mm):  34 mm Descending aorta ( <  40 mm):  25 mm Main pulmonary artery:  ( <  30 mm):  26 mm  EXTRACARDIAC FINDINGS: No effusion.  Dependent atelectasis present in the lungs.  4 mm pulmonary nodule is identified in the right middle lobe (image 29 series 4). If the patient is at high risk for bronchogenic carcinoma, follow-up chest CT at 1 year is recommended.  If the patient is at low risk, no follow-up is needed.  This recommendation follows the consensus statement: Guidelines for Management of Small Pulmonary Nodules Detected on CT Scans:  A Statement from the Fleischner Society as published in Radiology 2005; 237:395-400.  Available online at: DietDisorder.cz.  Incidental imaging of the upper abdomen is normal.  IMPRESSION:  1.  Right coronary artery disease.  The patient's total coronary artery calcium score is zero. 2.  50% or slightly greater proximal to mid right coronary artery stenosis due to  eccentric soft plaque.  Post stenotic dilation of the right coronary artery. 3.  Right coronary artery dominance. 4.  Diminutive circumflex artery. 5.  4 mm right middle lobe pulmonary nodule.  See followup recommendations above.  Report was called to [facility] at 05/03/2011 on 0923 hours.  Original Report Authenticated By: Andreas Newport, M.D.         Recent Labs: 11/05/2022: ALT 20; BUN 13; Creatinine, Ser 0.89; Hemoglobin 13.9; Platelets 248.0; Potassium 3.9; Sodium 136  Recent Lipid Panel    Component Value Date/Time   CHOL 140 11/05/2022 1340   CHOL 137 04/21/2021 0943   TRIG 55.0 11/05/2022 1340   TRIG 78 02/28/2006 0938   HDL 64.70 11/05/2022 1340   HDL 57 04/21/2021 0943   CHOLHDL 2 11/05/2022 1340   VLDL 11.0 11/05/2022 1340   LDLCALC 64 11/05/2022 1340   LDLCALC 67 04/21/2021 0943    History of Present Illness    72 year old male with a history of CAD, hypertension, hyperlipidemia, IBS, GERD, BPH, and metastatic prostate cancer.   Echocardiogram in 2013 showed normal LV function, mild LVH, G1 DD.  Coronary CT angiogram in 2019 showed mixed plaque in the proximal to mid LAD without significant stenosis, coronary calcium score of 15.   He does have a history of myalgias with statin therapy, through the is tolerating Lipitor 10 mg daily.  He was last seen in the office on 03/08/2022 and was stable from a cardiac standpoint.    He presents today for follow-up.  Since his last visit he has been stable from a cardiac standpoint.  He denies symptoms concerning for angina.  He was recently diagnosed with metastatic prostate cancer and will begin chemotherapy this Friday.  Overall, he reports feeling well.  In 6 months with Dr. Jens Som.  1. CAD: Coronary CT angiogram in 2019 showed mixed plaque in the proximal to mid LAD without significant stenosis, coronary calcium score of 15.  Stable with no anginal symptoms. No indication for ischemic evaluation. Continue aspirin,  amlodipine, hydrochlorothiazide, and Lipitor.    2. Hypertension: BP well controlled. Continue current antihypertensive regimen.    3. Hyperlipidemia: LDL was 64 in 10/2022. Continue ASA, Lipitor.    4. Disposition: Follow-up in  Home Medications    Current Outpatient Medications  Medication Sig Dispense Refill   amLODipine (NORVASC) 5 MG tablet TAKE 1 TABLET (5 MG TOTAL) BY MOUTH DAILY. 90 tablet 3   aspirin EC 81 MG tablet Take 81 mg by mouth daily.     atorvastatin (LIPITOR) 10 MG tablet TAKE 1 TABLET BY MOUTH EVERY DAY 90 tablet 3   benazepril (LOTENSIN) 40 MG tablet TAKE 1 TABLET BY MOUTH EVERY DAY 90 tablet 3   Cholecalciferol (VITAMIN D) 125 MCG (5000 UT) CAPS  Take 125 mcg by mouth daily.      Coenzyme Q10 (CO Q 10 PO) Take 1 capsule by mouth daily.     darolutamide (NUBEQA) 300 MG tablet Take 600 mg by mouth 2 (two) times daily with a meal.     dexamethasone (DECADRON) 4 MG tablet Take 2 tabs by mouth 2 times daily starting day before chemo. Then take 2 tabs daily for 2 days starting day after chemo. Take with food. 30 tablet 1   famotidine (PEPCID) 10 MG tablet Take 10 mg by mouth as needed for heartburn or indigestion.     hydrochlorothiazide (MICROZIDE) 12.5 MG capsule TAKE 1 CAPSULE BY MOUTH EVERY DAY 90 capsule 2   lidocaine-prilocaine (EMLA) cream Apply to affected area once 30 g 3   Naproxen Sod-diphenhydrAMINE (ALEVE PM) 220-25 MG TABS Take 1 tablet by mouth as needed.      Omega-3 Fatty Acids (OMEGA 3 PO) Take 1 capsule by mouth daily.      ondansetron (ZOFRAN) 8 MG tablet Take 1 tablet (8 mg total) by mouth every 8 (eight) hours as needed for nausea or vomiting. 30 tablet 1   prochlorperazine (COMPAZINE) 10 MG tablet Take 1 tablet (10 mg total) by mouth every 6 (six) hours as needed for nausea or vomiting. 30 tablet 1   psyllium (METAMUCIL) 58.6 % packet Take 2 packets by mouth 2 (two) times daily.      relugolix (ORGOVYX) 120 MG tablet Take 120 mg by mouth daily.      No current facility-administered medications for this visit.     Review of Systems    ***.  All other systems reviewed and are otherwise negative except as noted above.    Physical Exam    VS:  BP 112/72 (BP Location: Left Arm, Patient Position: Sitting, Cuff Size: Normal)   Pulse 87   Ht 5\' 9"  (1.753 m)   Wt 174 lb 12.8 oz (79.3 kg)   SpO2 94%   BMI 25.81 kg/m   GEN: Well nourished, well developed, in no acute distress. HEENT: normal. Neck: Supple, no JVD, carotid bruits, or masses. Cardiac: RRR, no murmurs, rubs, or gallops. No clubbing, cyanosis, edema.  Radials/DP/PT 2+ and equal bilaterally.  Respiratory:  Respirations regular and unlabored, clear to auscultation bilaterally. GI: Soft, nontender, nondistended, BS + x 4. MS: no deformity or atrophy. Skin: warm and dry, no rash. Neuro:  Strength and sensation are intact. Psych: Normal affect.  Accessory Clinical Findings    ECG personally reviewed by me today - EKG Interpretation Date/Time:  Monday March 04 2023 10:57:32 EDT Ventricular Rate:  80 PR Interval:  158 QRS Duration:  88 QT Interval:  398 QTC Calculation: 459 R Axis:   -15  Text Interpretation: Sinus rhythm with marked sinus arrhythmia Confirmed by Bernadene Person ([id number]) on 03/04/2023 10:59:05 AM  - no acute changes.   Lab Results  Component Value Date   WBC 6.4 11/05/2022   HGB 13.9 11/05/2022   HCT 41.2 11/05/2022   MCV 89.6 11/05/2022   PLT 248.0 11/05/2022   Lab Results  Component Value Date   CREATININE 0.89 11/05/2022   BUN 13 11/05/2022   NA 136 11/05/2022   K 3.9 11/05/2022   CL 101 11/05/2022   CO2 30 11/05/2022   Lab Results  Component Value Date   ALT 20 11/05/2022   AST 23 11/05/2022   ALKPHOS 71 11/05/2022   BILITOT 1.5 (H) 11/05/2022   Lab Results  Component Value  Date   CHOL 140 11/05/2022   HDL 64.70 11/05/2022   LDLCALC 64 11/05/2022   TRIG 55.0 11/05/2022   CHOLHDL 2 11/05/2022    Lab Results  Component  Value Date   HGBA1C 5.5 11/05/2022    Assessment & Plan    1.  ***      Joylene Grapes, NP 03/04/2023, 11:01 AM

## 2023-03-05 ENCOUNTER — Ambulatory Visit (HOSPITAL_COMMUNITY)
Admission: RE | Admit: 2023-03-05 | Discharge: 2023-03-05 | Disposition: A | Discharge status: Home / Self Care | Payer: Medicare Other | Source: Ambulatory Visit

## 2023-03-05 ENCOUNTER — Encounter (HOSPITAL_COMMUNITY): Payer: Self-pay

## 2023-03-05 ENCOUNTER — Encounter: Payer: Self-pay | Admitting: Nurse Practitioner

## 2023-03-05 ENCOUNTER — Other Ambulatory Visit: Payer: Self-pay

## 2023-03-05 DIAGNOSIS — I10 Essential (primary) hypertension: Secondary | ICD-10-CM | POA: Diagnosis not present

## 2023-03-05 DIAGNOSIS — K589 Irritable bowel syndrome without diarrhea: Secondary | ICD-10-CM | POA: Diagnosis not present

## 2023-03-05 DIAGNOSIS — E785 Hyperlipidemia, unspecified: Secondary | ICD-10-CM | POA: Diagnosis not present

## 2023-03-05 DIAGNOSIS — N400 Enlarged prostate without lower urinary tract symptoms: Secondary | ICD-10-CM | POA: Insufficient documentation

## 2023-03-05 DIAGNOSIS — K219 Gastro-esophageal reflux disease without esophagitis: Secondary | ICD-10-CM | POA: Insufficient documentation

## 2023-03-05 DIAGNOSIS — Z01818 Encounter for other preprocedural examination: Secondary | ICD-10-CM

## 2023-03-05 DIAGNOSIS — C61 Malignant neoplasm of prostate: Secondary | ICD-10-CM | POA: Diagnosis not present

## 2023-03-05 DIAGNOSIS — I2510 Atherosclerotic heart disease of native coronary artery without angina pectoris: Secondary | ICD-10-CM | POA: Insufficient documentation

## 2023-03-05 HISTORY — PX: IR IMAGING GUIDED PORT INSERTION: IMG5740

## 2023-03-05 MED ORDER — FENTANYL CITRATE (PF) 100 MCG/2ML IJ SOLN
INTRAMUSCULAR | Status: AC
  Filled 2023-03-05: qty 2

## 2023-03-05 MED ORDER — LIDOCAINE-EPINEPHRINE 1 %-1:100000 IJ SOLN
20.0000 mL | Freq: Once | INTRAMUSCULAR | Status: AC
  Administered 2023-03-05: 20 mL via INTRADERMAL

## 2023-03-05 MED ORDER — MIDAZOLAM HCL 2 MG/2ML IJ SOLN
INTRAMUSCULAR | Status: AC | PRN
  Administered 2023-03-05: 1 mg via INTRAVENOUS

## 2023-03-05 MED ORDER — MIDAZOLAM HCL 2 MG/2ML IJ SOLN
INTRAMUSCULAR | Status: AC
  Filled 2023-03-05: qty 2

## 2023-03-05 MED ORDER — HEPARIN SOD (PORK) LOCK FLUSH 100 UNIT/ML IV SOLN
INTRAVENOUS | Status: AC
  Filled 2023-03-05: qty 5

## 2023-03-05 MED ORDER — HEPARIN SOD (PORK) LOCK FLUSH 100 UNIT/ML IV SOLN
500.0000 [IU] | Freq: Once | INTRAVENOUS | Status: AC
  Administered 2023-03-05: 500 [IU] via INTRAVENOUS

## 2023-03-05 MED ORDER — LIDOCAINE-EPINEPHRINE 1 %-1:100000 IJ SOLN
INTRAMUSCULAR | Status: AC
  Filled 2023-03-05: qty 1

## 2023-03-05 MED ORDER — FENTANYL CITRATE (PF) 100 MCG/2ML IJ SOLN
INTRAMUSCULAR | Status: AC | PRN
  Administered 2023-03-05: 50 ug via INTRAVENOUS

## 2023-03-05 MED ORDER — FENTANYL CITRATE (PF) 100 MCG/2ML IJ SOLN
INTRAMUSCULAR | Status: AC | PRN
  Administered 2023-03-05: 25 ug via INTRAVENOUS

## 2023-03-05 MED ORDER — SODIUM CHLORIDE 0.9 % IV SOLN: INTRAVENOUS | Status: DC

## 2023-03-05 NOTE — Procedures (Signed)
Vascular and Interventional Radiology Procedure Note  Patient: Nicholas Paul DOB: 19-Dec-1950 Medical Record Number: [id number] Note Date/Time: 03/05/23 8:53 AM   Performing Physician: Roanna Banning, MD Assistant(s): None  Diagnosis:  Prostate CA  Procedure: PORT PLACEMENT  Anesthesia: Conscious Sedation Complications: None Estimated Blood Loss: Minimal  Findings:  Successful right-sided port placement, with the tip of the catheter in the proximal right atrium.  Plan: Catheter ready for use.  See detailed procedure note with images in PACS. The patient tolerated the procedure well without incident or complication and was returned to Recovery in stable condition.    Roanna Banning, MD [facility]   Pager. [phone] Clinic. [phone]

## 2023-03-05 NOTE — Discharge Instructions (Signed)
For questions /concerns may call Interventional Radiology at [phone] or  Interventional Radiology clinic [phone]   You may remove your dressing and shower tomorrow afternoon  DO NOT use EMLA cream for 2 weeks after port placement as the cream will remove surgical glue on your incision.                                                                                         Implanted Port Insertion, Care After  The following information offers guidance on how to care for yourself after your procedure. Your health care provider may also give you more specific instructions. If you have problems or questions, contact your health care provider. What can I expect after the procedure? After the procedure, it is common to have: Discomfort at the port insertion site. Bruising on the skin over the port. This should improve over 3-4 days. Follow these instructions at home: [facility] care After your port is placed, you will get a manufacturer's information card. The card has information about your port. Keep this card with you at all times. Take care of the port as told by your health care provider. Ask your health care provider if you or a family member can get training for taking care of the port at home. A home health care nurse will be be available to help care for the port. Make sure to remember what type of port you have. Incision care  Follow instructions from your health care provider about how to take care of your port insertion site. Make sure you: Wash your hands with soap and water for at least 20 seconds before and after you change your bandage (dressing). If soap and water are not available, use hand sanitizer. Change your dressing as told by your health care provider. Leave stitches (sutures), skin glue, or adhesive strips in place. These skin closures may need to stay in place for 2 weeks or longer. If adhesive strip edges start to loosen and curl up, you may trim the loose edges. Do  not remove adhesive strips completely unless your health care provider tells you to do that. Check your port insertion site every day for signs of infection. Check for: Redness, swelling, or pain. Fluid or blood. Warmth. Pus or a bad smell. Activity Return to your normal activities as told by your health care provider. Ask your health care provider what activities are safe for you. You may have to avoid lifting. Ask your health care provider how much you can safely lift. General instructions Take over-the-counter and prescription medicines only as told by your health care provider. Do not take baths, swim, or use a hot tub until your health care provider approves. Ask your health care provider if you may take showers. You may only be allowed to take sponge baths. If you were given a sedative during the procedure, it can affect you for several hours. Do not drive or operate machinery until your health care provider says that it is safe. Wear a medical alert bracelet in case of an emergency. This will tell any health care providers that you have a port. Keep all follow-up visits.  This is important. Contact a health care provider if: You cannot flush your port with saline as directed, or you cannot draw blood from the port. You have a fever or chills. You have redness, swelling, or pain around your port insertion site. You have fluid or blood coming from your port insertion site. Your port insertion site feels warm to the touch. You have pus or a bad smell coming from the port insertion site. Get help right away if: You have chest pain or shortness of breath. You have bleeding from your port that you cannot control. These symptoms may be an emergency. Get help right away. Call 911. Do not wait to see if the symptoms will go away. Do not drive yourself to the hospital. Summary Take care of the port as told by your health care provider. Keep the manufacturer's information card with you at all  times. Change your dressing as told by your health care provider. Contact a health care provider if you have a fever or chills or if you have redness, swelling, or pain around your port insertion site. Keep all follow-up visits. This information is not intended to replace advice given to you by your health care provider. Make sure you discuss any questions you have with your health care provider.                                                                                              Moderate Conscious Sedation, Adult, Care After After the procedure, it is common to have: Sleepiness for a few hours. Impaired judgment for a few hours. Trouble with balance. Nausea or vomiting if you eat too soon. Follow these instructions at home: For the time period you were told by your health care provider:  Rest. Do not participate in activities where you could fall or become injured. Do not drive or use machinery. Do not drink alcohol. Do not take sleeping pills or medicines that cause drowsiness. Do not make important decisions or sign legal documents. Do not take care of children on your own. Eating and drinking Follow instructions from your health care provider about what you may eat and drink. Drink enough fluid to keep your urine pale yellow. If you vomit: Drink clear fluids slowly and in small amounts as you are able. Clear fluids include water, ice chips, low-calorie sports drinks, and fruit juice that has water added to it (diluted fruit juice). Eat light and bland foods in small amounts as you are able. These foods include bananas, applesauce, rice, lean meats, toast, and crackers. General instructions Take over-the-counter and prescription medicines only as told by your health care provider. Have a responsible adult stay with you for the time you are told. Do not use any products that contain nicotine or tobacco. These products include cigarettes, chewing tobacco, and vaping devices,  such as e-cigarettes. If you need help quitting, ask your health care provider. Return to your normal activities as told by your health care provider. Ask your health care provider what activities are safe for you. Your health care provider may give you more instructions. Make sure you  know what you can and cannot do. Contact a health care provider if: You are still sleepy or having trouble with balance after 24 hours. You feel light-headed. You vomit every time you eat or drink. You get a rash. You have a fever. You have redness or swelling around the IV site. Get help right away if: You have trouble breathing. You start to feel confused at home. These symptoms may be an emergency. Get help right away. Call 911. Do not wait to see if the symptoms will go away. Do not drive yourself to the hospital. This information is not intended to replace advice given to you by your health care provider. Make sure you discuss any questions you have with your health care provider. Document Revised: 11/13/2021 Document Reviewed: 11/13/2021 Elsevier Patient Education  2024 [employer].

## 2023-03-06 ENCOUNTER — Other Ambulatory Visit: Payer: Self-pay

## 2023-03-06 ENCOUNTER — Other Ambulatory Visit: Payer: Medicare Other

## 2023-03-06 ENCOUNTER — Inpatient Hospital Stay: Payer: Medicare Other

## 2023-03-06 DIAGNOSIS — C7951 Secondary malignant neoplasm of bone: Secondary | ICD-10-CM | POA: Diagnosis not present

## 2023-03-06 DIAGNOSIS — C61 Malignant neoplasm of prostate: Secondary | ICD-10-CM | POA: Diagnosis not present

## 2023-03-06 DIAGNOSIS — I10 Essential (primary) hypertension: Secondary | ICD-10-CM | POA: Diagnosis not present

## 2023-03-06 DIAGNOSIS — Z5189 Encounter for other specified aftercare: Secondary | ICD-10-CM | POA: Diagnosis not present

## 2023-03-06 DIAGNOSIS — Z23 Encounter for immunization: Secondary | ICD-10-CM | POA: Diagnosis not present

## 2023-03-06 DIAGNOSIS — Z5111 Encounter for antineoplastic chemotherapy: Secondary | ICD-10-CM | POA: Diagnosis not present

## 2023-03-06 LAB — CBC WITH DIFFERENTIAL (CANCER CENTER ONLY)
Abs Immature Granulocytes: 0.01 10*3/uL (ref 0.00–0.07)
Basophils Absolute: 0.1 10*3/uL (ref 0.0–0.1)
Basophils Relative: 1 %
Eosinophils Absolute: 0 10*3/uL (ref 0.0–0.5)
Eosinophils Relative: 1 %
HCT: 40.3 % (ref 39.0–52.0)
Hemoglobin: 14 g/dL (ref 13.0–17.0)
Immature Granulocytes: 0 %
Lymphocytes Relative: 20 %
Lymphs Abs: 1.5 10*3/uL (ref 0.7–4.0)
MCH: 30.7 pg (ref 26.0–34.0)
MCHC: 34.7 g/dL (ref 30.0–36.0)
MCV: 88.4 fL (ref 80.0–100.0)
Monocytes Absolute: 0.7 10*3/uL (ref 0.1–1.0)
Monocytes Relative: 9 %
Neutro Abs: 5.4 10*3/uL (ref 1.7–7.7)
Neutrophils Relative %: 69 %
Platelet Count: 213 10*3/uL (ref 150–400)
RBC: 4.56 MIL/uL (ref 4.22–5.81)
RDW: 12.7 % (ref 11.5–15.5)
WBC Count: 7.7 10*3/uL (ref 4.0–10.5)
nRBC: 0 % (ref 0.0–0.2)

## 2023-03-06 LAB — CMP (CANCER CENTER ONLY)
ALT: 36 U/L (ref 0–44)
AST: 27 U/L (ref 15–41)
Albumin: 4.3 g/dL (ref 3.5–5.0)
Alkaline Phosphatase: 77 U/L (ref 38–126)
Anion gap: 4 — ABNORMAL LOW (ref 5–15)
BUN: 12 mg/dL (ref 8–23)
CO2: 31 mmol/L (ref 22–32)
Calcium: 9.7 mg/dL (ref 8.9–10.3)
Chloride: 102 mmol/L (ref 98–111)
Creatinine: 0.9 mg/dL (ref 0.61–1.24)
GFR, Estimated: >60 mL/min (ref >60)
Glucose, Bld: 102 mg/dL — ABNORMAL HIGH (ref 70–99)
Potassium: 4.4 mmol/L (ref 3.5–5.1)
Sodium: 137 mmol/L (ref 135–145)
Total Bilirubin: 1.6 mg/dL — ABNORMAL HIGH (ref 0.3–1.2)
Total Protein: 7.4 g/dL (ref 6.5–8.1)

## 2023-03-07 LAB — TESTOSTERONE: Testosterone: <3 ng/dL — ABNORMAL LOW (ref 264–916)

## 2023-03-07 NOTE — Progress Notes (Addendum)
Message left for call back to review any questions prior to upcoming chemotherapy on 10/25.   RN spoke with patient and reviewed treatment recommendations/plan for upcoming chemotherapy on 10/25.  All questions answered.

## 2023-03-08 ENCOUNTER — Inpatient Hospital Stay: Payer: Medicare Other

## 2023-03-08 ENCOUNTER — Other Ambulatory Visit: Payer: Self-pay

## 2023-03-08 VITALS — BP 111/66 | HR 85 | Temp 98.0°F | Resp 16

## 2023-03-08 DIAGNOSIS — Z5189 Encounter for other specified aftercare: Secondary | ICD-10-CM | POA: Diagnosis not present

## 2023-03-08 DIAGNOSIS — Z23 Encounter for immunization: Secondary | ICD-10-CM | POA: Diagnosis not present

## 2023-03-08 DIAGNOSIS — I10 Essential (primary) hypertension: Secondary | ICD-10-CM | POA: Diagnosis not present

## 2023-03-08 DIAGNOSIS — C61 Malignant neoplasm of prostate: Secondary | ICD-10-CM | POA: Diagnosis not present

## 2023-03-08 DIAGNOSIS — Z5111 Encounter for antineoplastic chemotherapy: Secondary | ICD-10-CM | POA: Diagnosis not present

## 2023-03-08 DIAGNOSIS — C7951 Secondary malignant neoplasm of bone: Secondary | ICD-10-CM | POA: Diagnosis not present

## 2023-03-08 LAB — PSA, TOTAL AND FREE
PSA, Free Pct: 50 %
PSA, Free: 0.05 ng/mL
Prostate Specific Ag, Serum: 0.1 ng/mL (ref 0.0–4.0)

## 2023-03-08 MED ORDER — SODIUM CHLORIDE 0.9% FLUSH
10.0000 mL | INTRAVENOUS | Status: DC | PRN
Start: 2023-03-08 — End: 2023-03-08
  Administered 2023-03-08: 10 mL

## 2023-03-08 MED ORDER — SODIUM CHLORIDE 0.9 % IV SOLN: INTRAVENOUS | Status: DC

## 2023-03-08 MED ORDER — SODIUM CHLORIDE 0.9 % IV SOLN
75.0000 mg/m2 | Freq: Once | INTRAVENOUS | Status: AC
  Administered 2023-03-08: 160 mg via INTRAVENOUS
  Filled 2023-03-08: qty 16

## 2023-03-08 MED ORDER — DEXAMETHASONE SODIUM PHOSPHATE 10 MG/ML IJ SOLN
10.0000 mg | Freq: Once | INTRAMUSCULAR | Status: AC
  Administered 2023-03-08: 10 mg via INTRAVENOUS
  Filled 2023-03-08: qty 1

## 2023-03-08 MED ORDER — HEPARIN SOD (PORK) LOCK FLUSH 100 UNIT/ML IV SOLN
500.0000 [IU] | Freq: Once | INTRAVENOUS | Status: AC | PRN
Start: 2023-03-08 — End: 2023-03-08
  Administered 2023-03-08: 500 [IU]

## 2023-03-08 NOTE — Progress Notes (Signed)
Ok to treat with tbili 1.6  T.[address] Beatris Si, PharmD

## 2023-03-08 NOTE — Patient Instructions (Signed)
[facility]  Discharge Instructions: Thank you for choosing [facility] to provide your oncology and hematology care.   If you have a lab appointment with the Cancer Center, please go directly to the Cancer Center and check in at the registration area.   Wear comfortable clothing and clothing appropriate for easy access to any Portacath or PICC line.   We strive to give you quality time with your provider. You may need to reschedule your appointment if you arrive late (15 or more minutes).  Arriving late affects you and other patients whose appointments are after yours.  Also, if you miss three or more appointments without notifying the office, you may be dismissed from the clinic at the provider's discretion.      For prescription refill requests, have your pharmacy contact our office and allow 72 hours for refills to be completed.    Today you received the following chemotherapy and/or immunotherapy agents: Taxotere.      To help prevent nausea and vomiting after your treatment, we encourage you to take your nausea medication as directed.  BELOW ARE SYMPTOMS THAT SHOULD BE REPORTED IMMEDIATELY: *FEVER GREATER THAN 100.4 F (38 C) OR HIGHER *CHILLS OR SWEATING *NAUSEA AND VOMITING THAT IS NOT CONTROLLED WITH YOUR NAUSEA MEDICATION *UNUSUAL SHORTNESS OF BREATH *UNUSUAL BRUISING OR BLEEDING *URINARY PROBLEMS (pain or burning when urinating, or frequent urination) *BOWEL PROBLEMS (unusual diarrhea, constipation, pain near the anus) TENDERNESS IN MOUTH AND THROAT WITH OR WITHOUT PRESENCE OF ULCERS (sore throat, sores in mouth, or a toothache) UNUSUAL RASH, SWELLING OR PAIN  UNUSUAL VAGINAL DISCHARGE OR ITCHING   Items with * indicate a potential emergency and should be followed up as soon as possible or go to the Emergency Department if any problems should occur.  Please show the CHEMOTHERAPY ALERT CARD or IMMUNOTHERAPY ALERT CARD at  check-in to the Emergency Department and triage nurse.  Should you have questions after your visit or need to cancel or reschedule your appointment, please contact [place] [facility]  Dept: [phone]  and follow the prompts.  Office hours are 8:00 a.m. to 4:30 p.m. Monday - Friday. Please note that voicemails left after 4:00 p.m. may not be returned until the following business day.  We are closed weekends and major holidays. You have access to a nurse at all times for urgent questions. Please call the main number to the clinic Dept: [phone] and follow the prompts.   For any non-urgent questions, you may also contact your provider using MyChart. We now offer e-Visits for anyone 41 and older to request care online for non-urgent symptoms. For details visit [email or web address].   Also download the MyChart app! Go to the app store, search "MyChart", open the app, select [place], and log in with your MyChart username and password.  Docetaxel Injection What is this medication? DOCETAXEL (doe se TAX el) treats some types of cancer. It works by slowing down the growth of cancer cells. This medicine may be used for other purposes; ask your health care provider or pharmacist if you have questions. COMMON BRAND NAME(S): Docefrez, Docivyx, Taxotere What should I tell my care team before I take this medication? They need to know if you have any of these conditions: Kidney disease Liver disease Low white blood cell levels Tingling of the fingers or toes or other nerve disorder An unusual or allergic reaction to docetaxel, polysorbate 80, other medications, foods, dyes,  or preservatives Pregnant or trying to get pregnant Breast-feeding How should I use this medication? This medication is injected into a vein. It is given by your care team in a hospital or clinic setting. Talk to your care team about the use of this medication in children. Special care may be  needed. Overdosage: If you think you have taken too much of this medicine contact a poison control center or emergency room at once. NOTE: This medicine is only for you. Do not share this medicine with others. What if I miss a dose? Keep appointments for follow-up doses. It is important not to miss your dose. Call your care team if you are unable to keep an appointment. What may interact with this medication? Do not take this medication with any of the following: Live virus vaccines This medication may also interact with the following: Certain antibiotics, such as clarithromycin, telithromycin Certain antivirals for HIV or hepatitis Certain medications for fungal infections, such as itraconazole, ketoconazole, voriconazole Grapefruit juice Nefazodone Supplements, such as St. John's wort This list may not describe all possible interactions. Give your health care provider a list of all the medicines, herbs, non-prescription drugs, or dietary supplements you use. Also tell them if you smoke, drink alcohol, or use illegal drugs. Some items may interact with your medicine. What should I watch for while using this medication? This medication may make you feel generally unwell. This is not uncommon as chemotherapy can affect healthy cells as well as cancer cells. Report any side effects. Continue your course of treatment even though you feel ill unless your care team tells you to stop. You may need blood work done while you are taking this medication. This medication can cause serious side effects and infusion reactions. To reduce the risk, your care team may give you other medications to take before receiving this one. Be sure to follow the directions from your care team. This medication may increase your risk of getting an infection. Call your care team for advice if you get a fever, chills, sore throat, or other symptoms of a cold or flu. Do not treat yourself. Try to avoid being around people who  are sick. Avoid taking medications that contain aspirin, acetaminophen, ibuprofen, naproxen, or ketoprofen unless instructed by your care team. These medications may hide a fever. Be careful brushing or flossing your teeth or using a toothpick because you may get an infection or bleed more easily. If you have any dental work done, tell your dentist you are receiving this medication. Some products may contain alcohol. Ask your care team if this medication contains alcohol. Be sure to tell all care teams you are taking this medicine. Certain medications, like metronidazole and disulfiram, can cause an unpleasant reaction when taken with alcohol. The reaction includes flushing, headache, nausea, vomiting, sweating, and increased thirst. The reaction can last from 30 minutes to several hours. This medication may affect your coordination, reaction time, or judgement. Do not drive or operate machinery until you know how this medication affects you. Sit up or stand slowly to reduce the risk of dizzy or fainting spells. Drinking alcohol with this medication can increase the risk of these side effects. Talk to your care team about your risk of cancer. You may be more at risk for certain types of cancer if you take this medication. Talk to your care team if you wish to become pregnant or think you might be pregnant. This medication can cause serious birth defects if taken during  pregnancy or if you get pregnant within 2 months after stopping therapy. A negative pregnancy test is required before starting this medication. A reliable form of contraception is recommended while taking this medication and for 2 months after stopping it. Talk to your care team about reliable forms of contraception. Do not breast-feed while taking this medication and for 1 week after stopping therapy. Use a condom during sex and for 4 months after stopping therapy. Tell your care team right away if you think your partner might be pregnant.  This medication can cause serious birth defects. This medication may cause infertility. Talk to your care team if you are concerned about your fertility. What side effects may I notice from receiving this medication? Side effects that you should report to your care team as soon as possible: Allergic reactions--skin rash, itching, hives, swelling of the face, lips, tongue, or throat Change in vision such as blurry vision, seeing halos around lights, vision loss Infection--fever, chills, cough, or sore throat Infusion reactions--chest pain, shortness of breath or trouble breathing, feeling faint or lightheaded Low red blood cell level--unusual weakness or fatigue, dizziness, headache, trouble breathing Pain, tingling, or numbness in the hands or feet Painful swelling, warmth, or redness of the skin, blisters or sores at the infusion site Redness, blistering, peeling, or loosening of the skin, including inside the mouth Sudden or severe stomach pain, bloody diarrhea, fever, nausea, vomiting Swelling of the ankles, hands, or feet Tumor lysis syndrome (TLS)--nausea, vomiting, diarrhea, decrease in the amount of urine, dark urine, unusual weakness or fatigue, confusion, muscle pain or cramps, fast or irregular heartbeat, joint pain Unusual bruising or bleeding Side effects that usually do not require medical attention (report to your care team if they continue or are bothersome): Change in nail shape, thickness, or color Change in taste Hair loss Increased tears This list may not describe all possible side effects. Call your doctor for medical advice about side effects. You may report side effects to FDA at 1-800-FDA-1088. Where should I keep my medication? This medication is given in a hospital or clinic. It will not be stored at home. NOTE: This sheet is a summary. It may not cover all possible information. If you have questions about this medicine, talk to your doctor, pharmacist, or health care  provider.  2024 Elsevier/Gold Standard (2021-07-06 00:00:00)

## 2023-03-11 ENCOUNTER — Inpatient Hospital Stay: Payer: Medicare Other

## 2023-03-11 VITALS — BP 136/91 | HR 81 | Temp 98.1°F | Resp 18

## 2023-03-11 DIAGNOSIS — C7951 Secondary malignant neoplasm of bone: Secondary | ICD-10-CM | POA: Diagnosis not present

## 2023-03-11 DIAGNOSIS — I10 Essential (primary) hypertension: Secondary | ICD-10-CM | POA: Diagnosis not present

## 2023-03-11 DIAGNOSIS — Z5189 Encounter for other specified aftercare: Secondary | ICD-10-CM | POA: Diagnosis not present

## 2023-03-11 DIAGNOSIS — Z5111 Encounter for antineoplastic chemotherapy: Secondary | ICD-10-CM | POA: Diagnosis not present

## 2023-03-11 DIAGNOSIS — C61 Malignant neoplasm of prostate: Secondary | ICD-10-CM | POA: Diagnosis not present

## 2023-03-11 DIAGNOSIS — Z23 Encounter for immunization: Secondary | ICD-10-CM | POA: Diagnosis not present

## 2023-03-11 MED ORDER — PEGFILGRASTIM-FPGK 6 MG/0.6ML ~~LOC~~ SOSY
6.0000 mg | PREFILLED_SYRINGE | Freq: Once | SUBCUTANEOUS | Status: AC
  Administered 2023-03-11: 6 mg via SUBCUTANEOUS

## 2023-03-19 ENCOUNTER — Encounter (HOSPITAL_BASED_OUTPATIENT_CLINIC_OR_DEPARTMENT_OTHER): Payer: Self-pay

## 2023-03-19 ENCOUNTER — Ambulatory Visit (HOSPITAL_BASED_OUTPATIENT_CLINIC_OR_DEPARTMENT_OTHER): Admit: 2023-03-19 | Payer: Medicare Other | Admitting: Urology

## 2023-03-19 SURGERY — INSERTION, GOLD SEEDS
Anesthesia: Monitor Anesthesia Care

## 2023-03-21 ENCOUNTER — Ambulatory Visit: Payer: Medicare Other | Admitting: Radiation Oncology

## 2023-03-22 ENCOUNTER — Other Ambulatory Visit: Payer: Self-pay

## 2023-03-22 DIAGNOSIS — C61 Malignant neoplasm of prostate: Secondary | ICD-10-CM

## 2023-03-28 NOTE — Assessment & Plan Note (Addendum)
Will order baseline bone mineral density study calcium (1000-1200 mg daily from food and supplements) and vitamin D3 (1000 IU daily) Zometa on ADT after dental clearance. Recommend him to see his dentist.  Healthy diet to prevent diabetes Weight-bearing exercises (30 minutes per day) Limit alcohol consumption and avoid smoking He should continue to see his cardiology team to optimize his cardiac condition while on treatment for active prostate cancer

## 2023-03-28 NOTE — Assessment & Plan Note (Addendum)
Continue docetaxel every 3 weeks if able Continue Orgovyx and darolutamide Follow up with me on Thursday with labs and treatment on Friday if meeting parameters Monitor for signs of infection and neutropenic fever on docetaxel

## 2023-03-28 NOTE — Progress Notes (Signed)
Patient Care Team: Melven Sartorius, MD as PCP - General (Oncology) Jens Som Madolyn Frieze, MD as PCP - Cardiology (Cardiology) Bebe Shaggy., MD as Consulting Physician (Urology) [facility] [place] as Consulting Physician (Optometry) Cherlyn Cushing, RN as Oncology Nurse Navigator Melven Sartorius, MD as Consulting Physician (Oncology)  Clinic Day:  03/29/2023  Referring physician: [name], MD  ASSESSMENT & PLAN:   Assessment & Plan: Prostate cancer metastatic to bone [facility]) Continue docetaxel every 3 weeks if able Continue Orgovyx and darolutamide Follow up with me on Thursday with labs and treatment on Friday if meeting parameters Monitor for signs of infection and neutropenic fever on docetaxel  At risk for side effect of medication Will order baseline bone mineral density study calcium (1000-1200 mg daily from food and supplements) and vitamin D3 (1000 IU daily) Zometa on ADT after dental clearance. Recommend him to see his dentist.  Healthy diet to prevent diabetes Weight-bearing exercises (30 minutes per day) Limit alcohol consumption and avoid smoking He should continue to see his cardiology team to optimize his cardiac condition while on treatment for active prostate cancer  Essential hypertension Currently on amlodipine, hydrochlorothiazide and benazepril.  Monitor BP at home.    The patient understands the plans discussed today and is in agreement with them.  He knows to contact our office if he develops concerns prior to his next appointment.  Melven Sartorius, MD  [facility] - A DEPT OF [facility] [address] [place] [address] Dept: [phone] Dept Fax: [phone]   Orders Placed This Encounter  Procedures   DG Bone Density    Standing Status:   Future    Standing Expiration Date:   03/28/2024    Order Specific Question:   Reason for Exam (SYMPTOM  OR DIAGNOSIS  REQUIRED)    Answer:   at risk of osteopenia from ADT    Order Specific Question:   Preferred imaging location?    Answer:   [facility]      CHIEF COMPLAINT:  CC: prostate cancer  Current Treatment:  triplet therapy  INTERVAL HISTORY:  Nicholas Paul is here today for repeat clinical assessment. He feels worse about the following Mon to Wed. Last week has been well. He was able play golf. He denies fevers or chills. He denies pain. His appetite is good. No abdominal pain, diarrhea, nausea, vomiting or constipation. No difficulty urinating. He feels claritin causing drowiness. No other signs of infection. No pain.  I have reviewed the past medical history, past surgical history, social history and family history with the patient and they are unchanged from previous note.  ALLERGIES:  is allergic to doxazosin mesylate and crestor [rosuvastatin].  MEDICATIONS:  Current Outpatient Medications  Medication Sig Dispense Refill   amLODipine (NORVASC) 5 MG tablet TAKE 1 TABLET (5 MG TOTAL) BY MOUTH DAILY. 90 tablet 3   aspirin EC 81 MG tablet Take 81 mg by mouth daily.     atorvastatin (LIPITOR) 10 MG tablet TAKE 1 TABLET BY MOUTH EVERY DAY 90 tablet 3   benazepril (LOTENSIN) 40 MG tablet TAKE 1 TABLET BY MOUTH EVERY DAY 90 tablet 3   Cholecalciferol (VITAMIN D) 125 MCG (5000 UT) CAPS Take 125 mcg by mouth daily.      Coenzyme Q10 (CO Q 10 PO) Take 1 capsule by mouth daily.     darolutamide (NUBEQA) 300 MG tablet Take 600 mg by  mouth 2 (two) times daily with a meal.     dexamethasone (DECADRON) 4 MG tablet Take 2 tabs by mouth 2 times daily starting day before chemo. Then take 2 tabs daily for 2 days starting day after chemo. Take with food. 30 tablet 1   famotidine (PEPCID) 10 MG tablet Take 10 mg by mouth as needed for heartburn or indigestion.     hydrochlorothiazide (MICROZIDE) 12.5 MG capsule TAKE 1 CAPSULE BY MOUTH EVERY DAY 90 capsule 2   lidocaine-prilocaine (EMLA) cream Apply to  affected area once 30 g 3   Naproxen Sod-diphenhydrAMINE (ALEVE PM) 220-25 MG TABS Take 1 tablet by mouth as needed.      Omega-3 Fatty Acids (OMEGA 3 PO) Take 1 capsule by mouth daily.      ondansetron (ZOFRAN) 8 MG tablet Take 1 tablet (8 mg total) by mouth every 8 (eight) hours as needed for nausea or vomiting. 30 tablet 1   prochlorperazine (COMPAZINE) 10 MG tablet Take 1 tablet (10 mg total) by mouth every 6 (six) hours as needed for nausea or vomiting. 30 tablet 1   psyllium (METAMUCIL) 58.6 % packet Take 2 packets by mouth 2 (two) times daily.      relugolix (ORGOVYX) 120 MG tablet Take 120 mg by mouth daily.     No current facility-administered medications for this visit.    HISTORY OF PRESENT ILLNESS:   Oncology History  Prostate cancer metastatic to bone (HCC)  07/15/2017 Tumor Marker   PSA 1.89   09/27/2017 Tumor Marker   PSA 2.26   11/06/2018 Tumor Marker   PSA 3.04   04/22/2019 Tumor Marker   PSA 2.97   12/23/2019 Tumor Marker   PSA 1.76   12/15/2020 Tumor Marker   PSA 2.43   12/18/2021 Tumor Marker   PSA 3.76   11/27/2022 Surgery   TURP for BPH with Lower urinary tract symptoms. High risk T1b GG5 (Gleason 4+5=9) in 30% of resected tissue.   12/24/2022 Tumor Marker   PSA 12.4   01/01/2023 PET scan   PSMA PET Radiotracer avid glandular noted on the right aspect of the prostate.  Potential more central activity difficult to separate from the urine activity. Focal radiotracer activity within the right seminal vesicle consistent with prostate adenocarcinoma metastases Radiotracer avid right and left iliac lymph nodes consistent with nodal metastases.  Radiotracer avid periaortic retroperitoneal lymph node consistent with nodal metastasis Skeletal metastases including sternum, L3 vertebral body, and left iliac bone.   01/09/2023 -  Chemotherapy   Started Orgovyx 01/17/23 darolutamide   01/17/2023 Initial Diagnosis   Prostate cancer metastatic to bone (HCC)   01/18/2023  Cancer Staging   Staging form: Prostate, AJCC 8th Edition - Clinical: Stage IVB (cT1b, cN1, cM1b, PSA: 12.4, Grade Group: 5) - Signed by Melven Sartorius, MD on 01/18/2023 Prostate specific antigen (PSA) range: 10 to 19 Gleason primary pattern: 4 Gleason secondary pattern: 5 Histologic grading system: 5 grade system   02/13/2023 Genetic Testing   [employer] Negative for pathologic variant   03/01/2023 Genetic Testing   Negative genetic testing on the CancerNext-Expanded+RNAinsight panel.  The report date is 03/01/2023.  The CancerNext-Expanded gene panel offered by [facility] and includes sequencing and rearrangement analysis for the following 77 genes: AIP, ALK, APC*, ATM*, AXIN2, BAP1, BARD1, BMPR1A, BRCA1*, BRCA2*, BRIP1*, CDC73, CDH1*, CDK4, CDKN1B, CDKN2A, CHEK2*, CTNNA1, DICER1, FH, FLCN, KIF1B, LZTR1, MAX, MEN1, MET, MLH1*, MSH2*, MSH3, MSH6*, MUTYH*, NF1*, NF2, NTHL1, PALB2*, PHOX2B, PMS2*, POT1, PRKAR1A,  PTCH1, PTEN*, RAD51C*, RAD51D*, RB1, RET, SDHA, SDHAF2, SDHB, SDHC, SDHD, SMAD4, SMARCA4, SMARCB1, SMARCE1, STK11, SUFU, TMEM127, TP53*, TSC1, TSC2, and VHL (sequencing and deletion/duplication); EGFR, EGLN1, HOXB13, KIT, MITF, PDGFRA, POLD1, and POLE (sequencing only); EPCAM and GREM1 (deletion/duplication only). DNA and RNA analyses performed for * genes.    03/04/2023 Genetic Testing   Patient has genetic testing done for prostate cancer. Results were negative for pathologic mutations from [facility] 71 genes panel.   03/08/2023 -  Chemotherapy   Patient is on Treatment Plan : PROSTATE Docetaxel (75) q21d         REVIEW OF SYSTEMS:   All relevant systems were reviewed with the patient and are negative.   VITALS:  Blood pressure 113/69, pulse 75, temperature 98.5 F (36.9 C), temperature source Temporal, resp. rate 16, height 5\' 9"  (1.753 m), weight 177 lb 8 oz (80.5 kg), SpO2 99%.  Wt Readings from Last 3 Encounters:  03/29/23 177 lb 8 oz (80.5 kg)  03/05/23 174 lb  12.8 oz (79.3 kg)  03/04/23 174 lb 12.8 oz (79.3 kg)    Body mass index is 26.21 kg/m.  Performance status (ECOG): 0 - Asymptomatic  PHYSICAL EXAM:   GENERAL:alert, no distress and comfortable SKIN: skin color normal, no rashes  EYES: normal, sclera clear OROPHARYNX: no exudate, no erythema    NECK: supple,  non-tender, without nodularity LUNGS: clear to auscultation with normal breathing effort.  No wheeze or rales HEART: regular rate & rhythm and no murmurs and no lower extremity edema ABDOMEN: abdomen soft, non-tender and nondistended Musculoskeletal: no edema NEURO: alert, fluent speech  LABORATORY DATA:  I have reviewed the data as listed    Component Value Date/Time   NA 135 03/29/2023 0836   NA 137 12/30/2019 0944   K 3.8 03/29/2023 0836   CL 103 03/29/2023 0836   CO2 24 03/29/2023 0836   GLUCOSE 212 (H) 03/29/2023 0836   BUN 18 03/29/2023 0836   BUN 10 12/30/2019 0944   CREATININE 0.87 03/29/2023 0836   CALCIUM 9.2 03/29/2023 0836   PROT 6.7 03/29/2023 0836   PROT 6.9 04/21/2021 0943   ALBUMIN 4.0 03/29/2023 0836   ALBUMIN 4.5 04/21/2021 0943   AST 24 03/29/2023 0836   ALT 53 (H) 03/29/2023 0836   ALKPHOS 87 03/29/2023 0836   BILITOT 1.0 03/29/2023 0836   GFRNONAA >60 03/29/2023 0836   GFRAA 86 12/30/2019 0944    No results found for: "SPEP", "UPEP"  Lab Results  Component Value Date   WBC 13.3 (H) 03/29/2023   NEUTROABS 12.2 (H) 03/29/2023   HGB 11.4 (L) 03/29/2023   HCT 32.4 (L) 03/29/2023   MCV 88.8 03/29/2023   PLT 405 (H) 03/29/2023      Chemistry      Component Value Date/Time   NA 135 03/29/2023 0836   NA 137 12/30/2019 0944   K 3.8 03/29/2023 0836   CL 103 03/29/2023 0836   CO2 24 03/29/2023 0836   BUN 18 03/29/2023 0836   BUN 10 12/30/2019 0944   CREATININE 0.87 03/29/2023 0836      Component Value Date/Time   CALCIUM 9.2 03/29/2023 0836   ALKPHOS 87 03/29/2023 0836   AST 24 03/29/2023 0836   ALT 53 (H) 03/29/2023 0836    BILITOT 1.0 03/29/2023 0836       RADIOGRAPHIC STUDIES: I have personally reviewed the radiological images as listed and agreed with the findings in the report. IR IMAGING GUIDED PORT INSERTION  Result Date:  03/05/2023 INDICATION: mediport for chemo.  Prostate cancer. EXAM: IMPLANTED PORT A CATH PLACEMENT WITH ULTRASOUND AND FLUOROSCOPIC GUIDANCE MEDICATIONS: None. ANESTHESIA/SEDATION: Moderate (conscious) sedation was employed during this procedure. A total of Versed 2 mg and Fentanyl 100 mcg was administered intravenously. Moderate Sedation Time: 21 minutes. The patient's level of consciousness and vital signs were monitored continuously by radiology nursing throughout the procedure under my direct supervision. FLUOROSCOPY TIME:  Fluoroscopic dose; 0 mGy COMPLICATIONS: None immediate. PROCEDURE: The procedure, risks, benefits, and alternatives were explained to the patient. Questions regarding the procedure were encouraged and answered. The patient understands and consents to the procedure. The RIGHT neck and chest were prepped with chlorhexidine in a sterile fashion, and a sterile drape was applied covering the operative field. Maximum barrier sterile technique with sterile gowns and gloves were used for the procedure. A timeout was performed prior to the initiation of the procedure. Local anesthesia was provided with 1% lidocaine with epinephrine. After creating a small venotomy incision, a micropuncture kit was utilized to access the internal jugular vein under direct, real-time ultrasound guidance. Ultrasound image documentation was performed. The microwire was kinked to measure appropriate catheter length. A subcutaneous port pocket was then created along the upper chest wall utilizing a combination of sharp and blunt dissection. The pocket was irrigated with sterile saline. A single lumen Non-ISP power injectable port was chosen for placement. The 8 Fr catheter was tunneled from the port pocket  site to the venotomy incision. The port was placed in the pocket. The external catheter was trimmed to appropriate length. At the venotomy, an 8 Fr peel-away sheath was placed over a guidewire under fluoroscopic guidance. The catheter was then placed through the sheath and the sheath was removed. Final catheter positioning was confirmed and documented with a fluoroscopic spot radiograph. The port was accessed with a Huber needle, aspirated and flushed with heparinized saline. The port pocket incision was closed with interrupted 3-0 Vicryl suture then Dermabond was applied, including at the venotomy incision. Dressings were placed. The patient tolerated the procedure well without immediate post procedural complication. IMPRESSION: Successful placement of a RIGHT internal jugular approach power injectable Port-A-Cath. The tip of the catheter is positioned at the superior cavo-atrial junction. The catheter is ready for immediate use. Roanna Banning, MD [facility] Electronically Signed   By: Roanna Banning M.D.   On: 03/05/2023 10:14

## 2023-03-28 NOTE — Assessment & Plan Note (Signed)
Currently on amlodipine, hydrochlorothiazide and benazepril.  Monitor BP at home.

## 2023-03-29 ENCOUNTER — Inpatient Hospital Stay: Payer: Medicare Other

## 2023-03-29 ENCOUNTER — Inpatient Hospital Stay (HOSPITAL_BASED_OUTPATIENT_CLINIC_OR_DEPARTMENT_OTHER): Payer: Medicare Other

## 2023-03-29 VITALS — BP 113/69 | HR 75 | Temp 98.5°F | Resp 16 | Ht 69.0 in | Wt 177.5 lb

## 2023-03-29 DIAGNOSIS — C61 Malignant neoplasm of prostate: Secondary | ICD-10-CM | POA: Insufficient documentation

## 2023-03-29 DIAGNOSIS — Z5111 Encounter for antineoplastic chemotherapy: Secondary | ICD-10-CM | POA: Insufficient documentation

## 2023-03-29 DIAGNOSIS — Z79818 Long term (current) use of other agents affecting estrogen receptors and estrogen levels: Secondary | ICD-10-CM

## 2023-03-29 DIAGNOSIS — Z9189 Other specified personal risk factors, not elsewhere classified: Secondary | ICD-10-CM | POA: Diagnosis not present

## 2023-03-29 DIAGNOSIS — I10 Essential (primary) hypertension: Secondary | ICD-10-CM | POA: Insufficient documentation

## 2023-03-29 DIAGNOSIS — D6481 Anemia due to antineoplastic chemotherapy: Secondary | ICD-10-CM | POA: Insufficient documentation

## 2023-03-29 DIAGNOSIS — Z79899 Other long term (current) drug therapy: Secondary | ICD-10-CM | POA: Insufficient documentation

## 2023-03-29 DIAGNOSIS — C7951 Secondary malignant neoplasm of bone: Secondary | ICD-10-CM | POA: Diagnosis not present

## 2023-03-29 DIAGNOSIS — T451X5A Adverse effect of antineoplastic and immunosuppressive drugs, initial encounter: Secondary | ICD-10-CM | POA: Diagnosis not present

## 2023-03-29 LAB — CMP (CANCER CENTER ONLY)
ALT: 53 U/L — ABNORMAL HIGH (ref 0–44)
AST: 24 U/L (ref 15–41)
Albumin: 4 g/dL (ref 3.5–5.0)
Alkaline Phosphatase: 87 U/L (ref 38–126)
Anion gap: 8 (ref 5–15)
BUN: 18 mg/dL (ref 8–23)
CO2: 24 mmol/L (ref 22–32)
Calcium: 9.2 mg/dL (ref 8.9–10.3)
Chloride: 103 mmol/L (ref 98–111)
Creatinine: 0.87 mg/dL (ref 0.61–1.24)
GFR, Estimated: >60 mL/min (ref >60)
Glucose, Bld: 212 mg/dL — ABNORMAL HIGH (ref 70–99)
Potassium: 3.8 mmol/L (ref 3.5–5.1)
Sodium: 135 mmol/L (ref 135–145)
Total Bilirubin: 1 mg/dL (ref <1.2)
Total Protein: 6.7 g/dL (ref 6.5–8.1)

## 2023-03-29 LAB — CBC WITH DIFFERENTIAL (CANCER CENTER ONLY)
Abs Immature Granulocytes: 0.08 10*3/uL — ABNORMAL HIGH (ref 0.00–0.07)
Basophils Absolute: 0 10*3/uL (ref 0.0–0.1)
Basophils Relative: 0 %
Eosinophils Absolute: 0 10*3/uL (ref 0.0–0.5)
Eosinophils Relative: 0 %
HCT: 32.4 % — ABNORMAL LOW (ref 39.0–52.0)
Hemoglobin: 11.4 g/dL — ABNORMAL LOW (ref 13.0–17.0)
Immature Granulocytes: 1 %
Lymphocytes Relative: 5 %
Lymphs Abs: 0.7 10*3/uL (ref 0.7–4.0)
MCH: 31.2 pg (ref 26.0–34.0)
MCHC: 35.2 g/dL (ref 30.0–36.0)
MCV: 88.8 fL (ref 80.0–100.0)
Monocytes Absolute: 0.3 10*3/uL (ref 0.1–1.0)
Monocytes Relative: 2 %
Neutro Abs: 12.2 10*3/uL — ABNORMAL HIGH (ref 1.7–7.7)
Neutrophils Relative %: 92 %
Platelet Count: 405 10*3/uL — ABNORMAL HIGH (ref 150–400)
RBC: 3.65 MIL/uL — ABNORMAL LOW (ref 4.22–5.81)
RDW: 13.2 % (ref 11.5–15.5)
WBC Count: 13.3 10*3/uL — ABNORMAL HIGH (ref 4.0–10.5)
nRBC: 0 % (ref 0.0–0.2)

## 2023-03-29 MED ORDER — DEXAMETHASONE SODIUM PHOSPHATE 10 MG/ML IJ SOLN
10.0000 mg | Freq: Once | INTRAMUSCULAR | Status: AC
  Administered 2023-03-29: 10 mg via INTRAVENOUS
  Filled 2023-03-29: qty 1

## 2023-03-29 MED ORDER — SODIUM CHLORIDE 0.9% FLUSH
10.0000 mL | Freq: Once | INTRAVENOUS | Status: AC
  Administered 2023-03-29: 10 mL via INTRAVENOUS

## 2023-03-29 MED ORDER — SODIUM CHLORIDE 0.9 % IV SOLN: INTRAVENOUS | Status: DC

## 2023-03-29 MED ORDER — HEPARIN SOD (PORK) LOCK FLUSH 100 UNIT/ML IV SOLN
500.0000 [IU] | Freq: Once | INTRAVENOUS | Status: AC | PRN
  Administered 2023-03-29: 500 [IU]

## 2023-03-29 MED ORDER — SODIUM CHLORIDE 0.9 % IV SOLN
75.0000 mg/m2 | Freq: Once | INTRAVENOUS | Status: AC
  Administered 2023-03-29: 160 mg via INTRAVENOUS
  Filled 2023-03-29: qty 16

## 2023-03-29 MED ORDER — SODIUM CHLORIDE 0.9% FLUSH
10.0000 mL | INTRAVENOUS | Status: DC | PRN
Start: 2023-03-29 — End: 2023-03-29
  Administered 2023-03-29: 10 mL

## 2023-03-29 NOTE — Assessment & Plan Note (Signed)
Expected from treatment Continue monitor

## 2023-03-31 LAB — PSA, TOTAL AND FREE
PSA, Free Pct: 60 %
PSA, Free: 0.18 ng/mL
Prostate Specific Ag, Serum: 0.3 ng/mL (ref 0.0–4.0)

## 2023-03-31 LAB — TESTOSTERONE: Testosterone: <3 ng/dL — ABNORMAL LOW (ref 264–916)

## 2023-04-01 ENCOUNTER — Inpatient Hospital Stay: Payer: Medicare Other

## 2023-04-01 ENCOUNTER — Other Ambulatory Visit: Payer: Self-pay

## 2023-04-01 DIAGNOSIS — C7951 Secondary malignant neoplasm of bone: Secondary | ICD-10-CM | POA: Diagnosis not present

## 2023-04-01 DIAGNOSIS — I10 Essential (primary) hypertension: Secondary | ICD-10-CM | POA: Diagnosis not present

## 2023-04-01 DIAGNOSIS — C61 Malignant neoplasm of prostate: Secondary | ICD-10-CM | POA: Diagnosis not present

## 2023-04-01 DIAGNOSIS — Z5111 Encounter for antineoplastic chemotherapy: Secondary | ICD-10-CM | POA: Diagnosis not present

## 2023-04-01 DIAGNOSIS — Z79899 Other long term (current) drug therapy: Secondary | ICD-10-CM | POA: Diagnosis not present

## 2023-04-01 MED ORDER — PEGFILGRASTIM-FPGK 6 MG/0.6ML ~~LOC~~ SOSY
6.0000 mg | PREFILLED_SYRINGE | Freq: Once | SUBCUTANEOUS | Status: AC
  Administered 2023-04-01: 6 mg via SUBCUTANEOUS

## 2023-04-04 ENCOUNTER — Ambulatory Visit (HOSPITAL_BASED_OUTPATIENT_CLINIC_OR_DEPARTMENT_OTHER)
Admission: RE | Admit: 2023-04-04 | Discharge: 2023-04-04 | Disposition: A | Discharge status: Home / Self Care | Payer: Medicare Other | Source: Ambulatory Visit

## 2023-04-04 ENCOUNTER — Telehealth: Payer: Self-pay

## 2023-04-04 DIAGNOSIS — Z79818 Long term (current) use of other agents affecting estrogen receptors and estrogen levels: Secondary | ICD-10-CM | POA: Diagnosis not present

## 2023-04-04 DIAGNOSIS — IMO0001 Reserved for inherently not codable concepts without codable children: Secondary | ICD-10-CM

## 2023-04-04 DIAGNOSIS — Z9189 Other specified personal risk factors, not elsewhere classified: Secondary | ICD-10-CM

## 2023-04-04 DIAGNOSIS — Z7952 Long term (current) use of systemic steroids: Secondary | ICD-10-CM | POA: Diagnosis not present

## 2023-04-04 NOTE — Telephone Encounter (Signed)
-----   Message from Melven Sartorius sent at 04/04/2023 10:44 AM EST ----- Please let him know bone density was normal. He should still take an adequate intake of dietary calcium (1200 mg/d) and vitamin D (400-800 IU daily) and use supplement if not enough thanks.

## 2023-04-04 NOTE — Telephone Encounter (Signed)
TC to patient to inform of the below information per Dr. Cherly Hensen. Pt verbalizes understanding and states he is already taking a Vitamin D supplement. No questions or problems to address at this time.

## 2023-04-13 ENCOUNTER — Other Ambulatory Visit: Payer: Self-pay | Admitting: Cardiology

## 2023-04-17 DIAGNOSIS — C7951 Secondary malignant neoplasm of bone: Secondary | ICD-10-CM | POA: Diagnosis not present

## 2023-04-17 DIAGNOSIS — C778 Secondary and unspecified malignant neoplasm of lymph nodes of multiple regions: Secondary | ICD-10-CM | POA: Diagnosis not present

## 2023-04-17 DIAGNOSIS — C61 Malignant neoplasm of prostate: Secondary | ICD-10-CM | POA: Diagnosis not present

## 2023-04-17 NOTE — Progress Notes (Unsigned)
Patient Care Team: Melven Sartorius, MD as PCP - General (Oncology) Jens Som Madolyn Frieze, MD as PCP - Cardiology (Cardiology) Bebe Shaggy., MD as Consulting Physician (Urology) [place] [facility] [place] as Consulting Physician (Optometry) Cherlyn Cushing, RN as Oncology Nurse Navigator Melven Sartorius, MD as Consulting Physician (Oncology)  Clinic Day:  04/18/2023  Referring physician: Melven Sartorius, MD  ASSESSMENT & PLAN:   Assessment & Plan: 72 y.o.man with h/o hypertension, hyperlipidemia, BPH here for follow up for metastatic prostate cancer.  He has excellent performance status despite his age.    Current diagnosis: mHSPC.  Initial diagnosis: mHSPC. PSA 12.4 with GG5 (Gleason 4+5=9). High volume disease with nonregional and regional lymph nodes, and 4 sites of bone metastases. Treatment: 12/2022. Started relugolix and 01/17/23 darolutamide  03/29/23 C1D1 docetaxel   Prostate cancer metastatic to bone [facility]) Continue docetaxel every 3 weeks if able Continue Orgovyx and darolutamide Follow up with me on Thursday with labs and treatment on Friday if meeting parameters Monitor for signs of infection and neutropenic fever on docetaxel  At risk for side effect of medication 03/2023 normal bone mineral density study calcium (1000-1200 mg daily from food and supplements) and vitamin D3 (1000 IU daily) Zometa on ADT after dental clearance. Recommend him to see his dentist.  Healthy diet to prevent diabetes Weight-bearing exercises (30 minutes per day) Limit alcohol consumption and avoid smoking He should continue to see his cardiology team to optimize his cardiac condition while on treatment for active prostate cancer  Hyperlipidemia On atorvastatin No signs of SE Continue monitor for toxicity with ARPI  Anemia due to antineoplastic chemotherapy Will postpone cycle 4 for 1 week.   Will postpone C4 to 1/3.  Follow-up on 1/2 with lab. The patient understands the plans  discussed today and is in agreement with them.  He knows to contact our office if he develops concerns prior to his next appointment.  Melven Sartorius, MD  [facility] - A DEPT OF [facility] [address] [place] [address] Dept: [phone] Dept Fax: [phone]   No orders of the defined types were placed in this encounter.     CHIEF COMPLAINT:  CC: mHSPC  Current Treatment: Docetaxel with darolutamide and Orgovyx  INTERVAL HISTORY:  Dushan is here today for repeat clinical assessment. He denies fevers or chills. He has aching after GCSF Tuesday morning and not feeling well for the next day. His appetite is good. No numbness or tingling or edema. No difficulty urinating.   I have reviewed the past medical history, past surgical history, social history and family history with the patient and they are unchanged from previous note.  ALLERGIES:  is allergic to doxazosin mesylate and crestor [rosuvastatin].  MEDICATIONS:  Current Outpatient Medications  Medication Sig Dispense Refill   amLODipine (NORVASC) 5 MG tablet TAKE 1 TABLET (5 MG TOTAL) BY MOUTH DAILY. 90 tablet 3   aspirin EC 81 MG tablet Take 81 mg by mouth daily.     atorvastatin (LIPITOR) 10 MG tablet TAKE 1 TABLET BY MOUTH EVERY DAY 90 tablet 3   benazepril (LOTENSIN) 40 MG tablet TAKE 1 TABLET BY MOUTH EVERY DAY 90 tablet 3   Cholecalciferol (VITAMIN D) 125 MCG (5000 UT) CAPS Take 125 mcg by mouth daily.      Coenzyme Q10 (CO Q 10 PO) Take 1 capsule by mouth daily.     darolutamide (  NUBEQA) 300 MG tablet Take 600 mg by mouth 2 (two) times daily with a meal.     dexamethasone (DECADRON) 4 MG tablet Take 2 tabs by mouth 2 times daily starting day before chemo. Then take 2 tabs daily for 2 days starting day after chemo. Take with food. 30 tablet 1   famotidine (PEPCID) 10 MG tablet Take 10 mg by mouth as needed for heartburn or indigestion.      hydrochlorothiazide (MICROZIDE) 12.5 MG capsule TAKE 1 CAPSULE BY MOUTH EVERY DAY 90 capsule 2   lidocaine-prilocaine (EMLA) cream Apply to affected area once 30 g 3   Naproxen Sod-diphenhydrAMINE (ALEVE PM) 220-25 MG TABS Take 1 tablet by mouth as needed.      Omega-3 Fatty Acids (OMEGA 3 PO) Take 1 capsule by mouth daily.      ondansetron (ZOFRAN) 8 MG tablet Take 1 tablet (8 mg total) by mouth every 8 (eight) hours as needed for nausea or vomiting. 30 tablet 1   prochlorperazine (COMPAZINE) 10 MG tablet Take 1 tablet (10 mg total) by mouth every 6 (six) hours as needed for nausea or vomiting. 30 tablet 1   psyllium (METAMUCIL) 58.6 % packet Take 2 packets by mouth 2 (two) times daily.      relugolix (ORGOVYX) 120 MG tablet Take 120 mg by mouth daily.     No current facility-administered medications for this visit.    HISTORY OF PRESENT ILLNESS:   Oncology History  Prostate cancer metastatic to bone (HCC)  07/15/2017 Tumor Marker   PSA 1.89   09/27/2017 Tumor Marker   PSA 2.26   11/06/2018 Tumor Marker   PSA 3.04   04/22/2019 Tumor Marker   PSA 2.97   12/23/2019 Tumor Marker   PSA 1.76   12/15/2020 Tumor Marker   PSA 2.43   12/18/2021 Tumor Marker   PSA 3.76   11/27/2022 Surgery   TURP for BPH with Lower urinary tract symptoms. High risk T1b GG5 (Gleason 4+5=9) in 30% of resected tissue.   12/24/2022 Tumor Marker   PSA 12.4   01/01/2023 PET scan   PSMA PET Radiotracer avid glandular noted on the right aspect of the prostate.  Potential more central activity difficult to separate from the urine activity. Focal radiotracer activity within the right seminal vesicle consistent with prostate adenocarcinoma metastases Radiotracer avid right and left iliac lymph nodes consistent with nodal metastases.  Radiotracer avid periaortic retroperitoneal lymph node consistent with nodal metastasis Skeletal metastases including sternum, L3 vertebral body, and left iliac bone.   01/09/2023 -   Chemotherapy   Started Orgovyx 01/17/23 darolutamide   01/17/2023 Initial Diagnosis   Prostate cancer metastatic to bone (HCC)   01/18/2023 Cancer Staging   Staging form: Prostate, AJCC 8th Edition - Clinical: Stage IVB (cT1b, cN1, cM1b, PSA: 12.4, Grade Group: 5) - Signed by Melven Sartorius, MD on 01/18/2023 Prostate specific antigen (PSA) range: 10 to 19 Gleason primary pattern: 4 Gleason secondary pattern: 5 Histologic grading system: 5 grade system   02/13/2023 Genetic Testing   [employer] Negative for pathologic variant   03/01/2023 Genetic Testing   Negative genetic testing on the CancerNext-Expanded+RNAinsight panel.  The report date is 03/01/2023.  The CancerNext-Expanded gene panel offered by [facility] and includes sequencing and rearrangement analysis for the following 77 genes: AIP, ALK, APC*, ATM*, AXIN2, BAP1, BARD1, BMPR1A, BRCA1*, BRCA2*, BRIP1*, CDC73, CDH1*, CDK4, CDKN1B, CDKN2A, CHEK2*, CTNNA1, DICER1, FH, FLCN, KIF1B, LZTR1, MAX, MEN1, MET, MLH1*, MSH2*, MSH3, MSH6*, MUTYH*,  NF1*, NF2, NTHL1, PALB2*, PHOX2B, PMS2*, POT1, PRKAR1A, PTCH1, PTEN*, RAD51C*, RAD51D*, RB1, RET, SDHA, SDHAF2, SDHB, SDHC, SDHD, SMAD4, SMARCA4, SMARCB1, SMARCE1, STK11, SUFU, TMEM127, TP53*, TSC1, TSC2, and VHL (sequencing and deletion/duplication); EGFR, EGLN1, HOXB13, KIT, MITF, PDGFRA, POLD1, and POLE (sequencing only); EPCAM and GREM1 (deletion/duplication only). DNA and RNA analyses performed for * genes.    03/04/2023 Genetic Testing   Patient has genetic testing done for prostate cancer. Results were negative for pathologic mutations from [facility] 71 genes panel.   03/08/2023 -  Chemotherapy   Patient is on Treatment Plan : PROSTATE Docetaxel (75) q21d         REVIEW OF SYSTEMS:   All relevant systems were reviewed with the patient and are negative.   VITALS:  Blood pressure 123/73, pulse 87, temperature (!) 97.3 F (36.3 C), resp. rate 18, weight 175 lb 8 oz (79.6 kg), SpO2 95%.   Wt Readings from Last 3 Encounters:  04/18/23 175 lb 8 oz (79.6 kg)  03/29/23 177 lb 8 oz (80.5 kg)  03/05/23 174 lb 12.8 oz (79.3 kg)    Body mass index is 25.92 kg/m.  Performance status (ECOG): 0 - Asymptomatic  PHYSICAL EXAM:   GENERAL:alert, no distress and comfortable SKIN: skin color normal, no rashes  EYES: normal, sclera clear OROPHARYNX: no exudate, no erythema    NECK: supple,  non-tender, without nodularity LYMPH:  no palpable cervical lymphadenopathy LUNGS: clear to auscultation with normal breathing effort.  No wheeze or rales HEART: regular rate & rhythm and no murmurs and no lower extremity edema ABDOMEN: abdomen soft, non-tender and nondistended Musculoskeletal: no edema  LABORATORY DATA:  I have reviewed the data as listed    Component Value Date/Time   NA 135 04/18/2023 1351   NA 137 12/30/2019 0944   K 3.6 04/18/2023 1351   CL 100 04/18/2023 1351   CO2 30 04/18/2023 1351   GLUCOSE 127 (H) 04/18/2023 1351   BUN 14 04/18/2023 1351   BUN 10 12/30/2019 0944   CREATININE 0.79 04/18/2023 1351   CALCIUM 9.3 04/18/2023 1351   PROT 6.6 04/18/2023 1351   PROT 6.9 04/21/2021 0943   ALBUMIN 3.9 04/18/2023 1351   ALBUMIN 4.5 04/21/2021 0943   AST 15 04/18/2023 1351   ALT 17 04/18/2023 1351   ALKPHOS 104 04/18/2023 1351   BILITOT 1.3 (H) 04/18/2023 1351   GFRNONAA >60 04/18/2023 1351   GFRAA 86 12/30/2019 0944    No results found for: "SPEP", "UPEP"  Lab Results  Component Value Date   WBC 9.1 04/18/2023   NEUTROABS 6.8 04/18/2023   HGB 10.9 (L) 04/18/2023   HCT 32.0 (L) 04/18/2023   MCV 89.9 04/18/2023   PLT 296 04/18/2023      Chemistry      Component Value Date/Time   NA 135 04/18/2023 1351   NA 137 12/30/2019 0944   K 3.6 04/18/2023 1351   CL 100 04/18/2023 1351   CO2 30 04/18/2023 1351   BUN 14 04/18/2023 1351   BUN 10 12/30/2019 0944   CREATININE 0.79 04/18/2023 1351      Component Value Date/Time   CALCIUM 9.3 04/18/2023 1351    ALKPHOS 104 04/18/2023 1351   AST 15 04/18/2023 1351   ALT 17 04/18/2023 1351   BILITOT 1.3 (H) 04/18/2023 1351       RADIOGRAPHIC STUDIES: I have personally reviewed the radiological images as listed and agreed with the findings in the report. DG Bone Density  Result Date: 04/04/2023 EXAM:  DUAL X-RAY ABSORPTIOMETRY (DXA) FOR BONE MINERAL DENSITY IMPRESSION: Referring Physician:  Melven Sartorius Your patient completed a bone mineral density test using GE Lunar iDXA system (analysis version: 16). Technologist: ALW PATIENT: Name: Nicholas Paul, Nicholas Paul Patient ID: [id number] Birth Date: 07-Oct-1950 Height: 69.0 in. Sex: Male Measured: 04/04/2023 Weight: 177.5 lbs. Indications: Advanced Age, Caucasian, Decadron, Pepcid, Prostate Cancer Fractures: Foot, Wrist Treatments: Vitamin D ASSESSMENT: The BMD measured at DualFemur Total Right is 1.001 g/cm2 with a T-score of -0.7. This patient is considered normal according to the World Health Organization [facility]) criteria. The scan quality is good. Site Region Measured Date Measured Age YA BMD Significant CHANGE T-score DualFemur Total Right 04/04/2023    [age]         -0.7    1.001 g/cm2 DualFemur Total Mean  3/04/2023    72.4         -0.7    1.006 g/cm2 AP Spine  L1-L4       3/04/2023    [age]         0.2     1.260 g/cm2 World Health Organization [facility]) criteria for post-menopausal, Caucasian Women: Normal       T-score at or above -1 SD Osteopenia   T-score between -1 and -2.5 SD Osteoporosis T-score at or below -2.5 SD RECOMMENDATION: Mild to aggressive therapies are available in the form of Hormone replacement therapy (HRT), bisphosphonates, Calcitonin, and SERMs. Additionally, all patients should ensure an adequate intake of dietary calcium (1200 mg/d) and vitamin D (400-800 IU daily). FOLLOW-UP: People with diagnosed cases of osteoporosis or osteopenia should be regularly tested for bone mineral density. For patients eligible for Medicare, routine testing is allowed  once every 2 years. The testing frequency can be increased to one year for patients who have rapidly progressing disease, or for those who are receiving medical therapy to restore bone mass. I have reviewed this study and agree with the findings. [facility] Electronically Signed   By: Romona Curls M.D.   On: 04/04/2023 09:37

## 2023-04-17 NOTE — Assessment & Plan Note (Signed)
03/2023 normal bone mineral density study calcium (1000-1200 mg daily from food and supplements) and vitamin D3 (1000 IU daily) Zometa on ADT after dental clearance. Recommend him to see his dentist.  Healthy diet to prevent diabetes Weight-bearing exercises (30 minutes per day) Limit alcohol consumption and avoid smoking He should continue to see his cardiology team to optimize his cardiac condition while on treatment for active prostate cancer

## 2023-04-17 NOTE — Assessment & Plan Note (Signed)
On atorvastatin No signs of SE Continue monitor for toxicity with ARPI

## 2023-04-17 NOTE — Assessment & Plan Note (Signed)
 Continue docetaxel every 3 weeks if able Continue Orgovyx and darolutamide Follow up with me on Thursday with labs and treatment on Friday if meeting parameters Monitor for signs of infection and neutropenic fever on docetaxel

## 2023-04-18 ENCOUNTER — Inpatient Hospital Stay (HOSPITAL_BASED_OUTPATIENT_CLINIC_OR_DEPARTMENT_OTHER): Payer: Medicare Other

## 2023-04-18 ENCOUNTER — Inpatient Hospital Stay: Payer: Medicare Other

## 2023-04-18 VITALS — BP 123/73 | HR 87 | Temp 97.3°F | Resp 18 | Wt 175.5 lb

## 2023-04-18 DIAGNOSIS — Z9189 Other specified personal risk factors, not elsewhere classified: Secondary | ICD-10-CM

## 2023-04-18 DIAGNOSIS — C61 Malignant neoplasm of prostate: Secondary | ICD-10-CM

## 2023-04-18 DIAGNOSIS — C7951 Secondary malignant neoplasm of bone: Secondary | ICD-10-CM | POA: Insufficient documentation

## 2023-04-18 DIAGNOSIS — D6481 Anemia due to antineoplastic chemotherapy: Secondary | ICD-10-CM | POA: Diagnosis not present

## 2023-04-18 DIAGNOSIS — Z79899 Other long term (current) drug therapy: Secondary | ICD-10-CM | POA: Insufficient documentation

## 2023-04-18 DIAGNOSIS — E785 Hyperlipidemia, unspecified: Secondary | ICD-10-CM | POA: Diagnosis not present

## 2023-04-18 DIAGNOSIS — Z5111 Encounter for antineoplastic chemotherapy: Secondary | ICD-10-CM | POA: Insufficient documentation

## 2023-04-18 DIAGNOSIS — Z95828 Presence of other vascular implants and grafts: Secondary | ICD-10-CM | POA: Insufficient documentation

## 2023-04-18 DIAGNOSIS — T451X5A Adverse effect of antineoplastic and immunosuppressive drugs, initial encounter: Secondary | ICD-10-CM | POA: Diagnosis not present

## 2023-04-18 LAB — CMP (CANCER CENTER ONLY)
ALT: 17 U/L (ref 0–44)
AST: 15 U/L (ref 15–41)
Albumin: 3.9 g/dL (ref 3.5–5.0)
Alkaline Phosphatase: 104 U/L (ref 38–126)
Anion gap: 5 (ref 5–15)
BUN: 14 mg/dL (ref 8–23)
CO2: 30 mmol/L (ref 22–32)
Calcium: 9.3 mg/dL (ref 8.9–10.3)
Chloride: 100 mmol/L (ref 98–111)
Creatinine: 0.79 mg/dL (ref 0.61–1.24)
GFR, Estimated: >60 mL/min (ref >60)
Glucose, Bld: 127 mg/dL — ABNORMAL HIGH (ref 70–99)
Potassium: 3.6 mmol/L (ref 3.5–5.1)
Sodium: 135 mmol/L (ref 135–145)
Total Bilirubin: 1.3 mg/dL — ABNORMAL HIGH (ref <1.2)
Total Protein: 6.6 g/dL (ref 6.5–8.1)

## 2023-04-18 LAB — CBC WITH DIFFERENTIAL (CANCER CENTER ONLY)
Abs Immature Granulocytes: 0.02 10*3/uL (ref 0.00–0.07)
Basophils Absolute: 0.1 10*3/uL (ref 0.0–0.1)
Basophils Relative: 1 %
Eosinophils Absolute: 0 10*3/uL (ref 0.0–0.5)
Eosinophils Relative: 0 %
HCT: 32 % — ABNORMAL LOW (ref 39.0–52.0)
Hemoglobin: 10.9 g/dL — ABNORMAL LOW (ref 13.0–17.0)
Immature Granulocytes: 0 %
Lymphocytes Relative: 12 %
Lymphs Abs: 1.1 10*3/uL (ref 0.7–4.0)
MCH: 30.6 pg (ref 26.0–34.0)
MCHC: 34.1 g/dL (ref 30.0–36.0)
MCV: 89.9 fL (ref 80.0–100.0)
Monocytes Absolute: 1.1 10*3/uL — ABNORMAL HIGH (ref 0.1–1.0)
Monocytes Relative: 12 %
Neutro Abs: 6.8 10*3/uL (ref 1.7–7.7)
Neutrophils Relative %: 75 %
Platelet Count: 296 10*3/uL (ref 150–400)
RBC: 3.56 MIL/uL — ABNORMAL LOW (ref 4.22–5.81)
RDW: 14.6 % (ref 11.5–15.5)
WBC Count: 9.1 10*3/uL (ref 4.0–10.5)
nRBC: 0 % (ref 0.0–0.2)

## 2023-04-18 MED ORDER — HEPARIN SOD (PORK) LOCK FLUSH 100 UNIT/ML IV SOLN
500.0000 [IU] | Freq: Once | INTRAVENOUS | Status: AC
  Administered 2023-04-18: 500 [IU]

## 2023-04-18 MED ORDER — SODIUM CHLORIDE 0.9% FLUSH
10.0000 mL | Freq: Once | INTRAVENOUS | Status: AC
  Administered 2023-04-18: 10 mL

## 2023-04-18 NOTE — Assessment & Plan Note (Signed)
Will postpone cycle 4 for 1 week.

## 2023-04-19 ENCOUNTER — Encounter: Payer: Self-pay | Admitting: General Practice

## 2023-04-19 ENCOUNTER — Inpatient Hospital Stay: Payer: Medicare Other

## 2023-04-19 VITALS — BP 128/73 | HR 87 | Temp 98.2°F | Resp 18 | Wt 175.0 lb

## 2023-04-19 DIAGNOSIS — Z79899 Other long term (current) drug therapy: Secondary | ICD-10-CM | POA: Diagnosis not present

## 2023-04-19 DIAGNOSIS — E785 Hyperlipidemia, unspecified: Secondary | ICD-10-CM | POA: Diagnosis not present

## 2023-04-19 DIAGNOSIS — C61 Malignant neoplasm of prostate: Secondary | ICD-10-CM | POA: Diagnosis not present

## 2023-04-19 DIAGNOSIS — Z5111 Encounter for antineoplastic chemotherapy: Secondary | ICD-10-CM | POA: Diagnosis not present

## 2023-04-19 DIAGNOSIS — C7951 Secondary malignant neoplasm of bone: Secondary | ICD-10-CM | POA: Diagnosis not present

## 2023-04-19 LAB — TESTOSTERONE: Testosterone: 4 ng/dL — ABNORMAL LOW (ref 264–916)

## 2023-04-19 LAB — PSA, TOTAL AND FREE
PSA, Free Pct: >30 %
PSA, Free: 0.03 ng/mL
Prostate Specific Ag, Serum: <0.1 ng/mL (ref 0.0–4.0)

## 2023-04-19 MED ORDER — SODIUM CHLORIDE 0.9 % IV SOLN: INTRAVENOUS | Status: DC

## 2023-04-19 MED ORDER — DEXAMETHASONE SODIUM PHOSPHATE 10 MG/ML IJ SOLN
10.0000 mg | Freq: Once | INTRAMUSCULAR | Status: AC
Start: 2023-04-19 — End: 2023-04-19
  Administered 2023-04-19: 10 mg via INTRAVENOUS
  Filled 2023-04-19: qty 1

## 2023-04-19 MED ORDER — SODIUM CHLORIDE 0.9 % IV SOLN
75.0000 mg/m2 | Freq: Once | INTRAVENOUS | Status: AC
  Administered 2023-04-19: 160 mg via INTRAVENOUS
  Filled 2023-04-19: qty 16

## 2023-04-19 NOTE — Patient Instructions (Signed)
[facility] - [facility]PT [facility]  Discharge Instructions: Thank you for choosing [facility] to provide your oncology and hematology care.   If you have a lab appointment with the Cancer Center, please go directly to the Cancer Center and check in at the registration area.   Wear comfortable clothing and clothing appropriate for easy access to any Portacath or PICC line.   We strive to give you quality time with your provider. You may need to reschedule your appointment if you arrive late (15 or more minutes).  Arriving late affects you and other patients whose appointments are after yours.  Also, if you miss three or more appointments without notifying the office, you may be dismissed from the clinic at the provider's discretion.      For prescription refill requests, have your pharmacy contact our office and allow 72 hours for refills to be completed.    Today you received the following chemotherapy and/or immunotherapy agents:Taxotere      To help prevent nausea and vomiting after your treatment, we encourage you to take your nausea medication as directed.  BELOW ARE SYMPTOMS THAT SHOULD BE REPORTED IMMEDIATELY: *FEVER GREATER THAN 100.4 F (38 C) OR HIGHER *CHILLS OR SWEATING *NAUSEA AND VOMITING THAT IS NOT CONTROLLED WITH YOUR NAUSEA MEDICATION *UNUSUAL SHORTNESS OF BREATH *UNUSUAL BRUISING OR BLEEDING *URINARY PROBLEMS (pain or burning when urinating, or frequent urination) *BOWEL PROBLEMS (unusual diarrhea, constipation, pain near the anus) TENDERNESS IN MOUTH AND THROAT WITH OR WITHOUT PRESENCE OF ULCERS (sore throat, sores in mouth, or a toothache) UNUSUAL RASH, SWELLING OR PAIN  UNUSUAL VAGINAL DISCHARGE OR ITCHING   Items with * indicate a potential emergency and should be followed up as soon as possible or go to the Emergency Department if any problems should occur.  Please show the CHEMOTHERAPY ALERT CARD or IMMUNOTHERAPY  ALERT CARD at check-in to the Emergency Department and triage nurse.  Should you have questions after your visit or need to cancel or reschedule your appointment, please contact [facility] - A DEPT OF [facility]  Dept: [phone]  and follow the prompts.  Office hours are 8:00 a.m. to 4:30 p.m. Monday - Friday. Please note that voicemails left after 4:00 p.m. may not be returned until the following business day.  We are closed weekends and major holidays. You have access to a nurse at all times for urgent questions. Please call the main number to the clinic Dept: [phone] and follow the prompts.   For any non-urgent questions, you may also contact your provider using MyChart. We now offer e-Visits for anyone 5 and older to request care online for non-urgent symptoms. For details visit [email or web address].   Also download the MyChart app! Go to the app store, search "MyChart", open the app, select [facility], and log in with your MyChart username and password.  Docetaxel Injection What is this medication? DOCETAXEL (doe se TAX el) treats some types of cancer. It works by slowing down the growth of cancer cells. This medicine may be used for other purposes; ask your health care provider or pharmacist if you have questions. COMMON BRAND NAME(S): Docefrez, Docivyx, Taxotere What should I tell my care team before I take this medication? They need to know if you have any of these conditions: Kidney disease Liver disease Low white blood cell levels Tingling of the fingers or toes or other nerve disorder  An unusual or allergic reaction to docetaxel, polysorbate 80, other medications, foods, dyes, or preservatives Pregnant or trying to get pregnant Breast-feeding How should I use this medication? This medication is injected into a vein. It is given by your care team in a hospital or clinic setting. Talk to your care team about the use of this medication  in children. Special care may be needed. Overdosage: If you think you have taken too much of this medicine contact a poison control center or emergency room at once. NOTE: This medicine is only for you. Do not share this medicine with others. What if I miss a dose? Keep appointments for follow-up doses. It is important not to miss your dose. Call your care team if you are unable to keep an appointment. What may interact with this medication? Do not take this medication with any of the following: Live virus vaccines This medication may also interact with the following: Certain antibiotics, such as clarithromycin, telithromycin Certain antivirals for HIV or hepatitis Certain medications for fungal infections, such as itraconazole, ketoconazole, voriconazole Grapefruit juice Nefazodone Supplements, such as St. John's wort This list may not describe all possible interactions. Give your health care provider a list of all the medicines, herbs, non-prescription drugs, or dietary supplements you use. Also tell them if you smoke, drink alcohol, or use illegal drugs. Some items may interact with your medicine. What should I watch for while using this medication? This medication may make you feel generally unwell. This is not uncommon as chemotherapy can affect healthy cells as well as cancer cells. Report any side effects. Continue your course of treatment even though you feel ill unless your care team tells you to stop. You may need blood work done while you are taking this medication. This medication can cause serious side effects and infusion reactions. To reduce the risk, your care team may give you other medications to take before receiving this one. Be sure to follow the directions from your care team. This medication may increase your risk of getting an infection. Call your care team for advice if you get a fever, chills, sore throat, or other symptoms of a cold or flu. Do not treat yourself. Try to  avoid being around people who are sick. Avoid taking medications that contain aspirin, acetaminophen, ibuprofen, naproxen, or ketoprofen unless instructed by your care team. These medications may hide a fever. Be careful brushing or flossing your teeth or using a toothpick because you may get an infection or bleed more easily. If you have any dental work done, tell your dentist you are receiving this medication. Some products may contain alcohol. Ask your care team if this medication contains alcohol. Be sure to tell all care teams you are taking this medicine. Certain medications, like metronidazole and disulfiram, can cause an unpleasant reaction when taken with alcohol. The reaction includes flushing, headache, nausea, vomiting, sweating, and increased thirst. The reaction can last from 30 minutes to several hours. This medication may affect your coordination, reaction time, or judgement. Do not drive or operate machinery until you know how this medication affects you. Sit up or stand slowly to reduce the risk of dizzy or fainting spells. Drinking alcohol with this medication can increase the risk of these side effects. Talk to your care team about your risk of cancer. You may be more at risk for certain types of cancer if you take this medication. Talk to your care team if you wish to become pregnant or think you  might be pregnant. This medication can cause serious birth defects if taken during pregnancy or if you get pregnant within 2 months after stopping therapy. A negative pregnancy test is required before starting this medication. A reliable form of contraception is recommended while taking this medication and for 2 months after stopping it. Talk to your care team about reliable forms of contraception. Do not breast-feed while taking this medication and for 1 week after stopping therapy. Use a condom during sex and for 4 months after stopping therapy. Tell your care team right away if you think your  partner might be pregnant. This medication can cause serious birth defects. This medication may cause infertility. Talk to your care team if you are concerned about your fertility. What side effects may I notice from receiving this medication? Side effects that you should report to your care team as soon as possible: Allergic reactions--skin rash, itching, hives, swelling of the face, lips, tongue, or throat Change in vision such as blurry vision, seeing halos around lights, vision loss Infection--fever, chills, cough, or sore throat Infusion reactions--chest pain, shortness of breath or trouble breathing, feeling faint or lightheaded Low red blood cell level--unusual weakness or fatigue, dizziness, headache, trouble breathing Pain, tingling, or numbness in the hands or feet Painful swelling, warmth, or redness of the skin, blisters or sores at the infusion site Redness, blistering, peeling, or loosening of the skin, including inside the mouth Sudden or severe stomach pain, bloody diarrhea, fever, nausea, vomiting Swelling of the ankles, hands, or feet Tumor lysis syndrome (TLS)--nausea, vomiting, diarrhea, decrease in the amount of urine, dark urine, unusual weakness or fatigue, confusion, muscle pain or cramps, fast or irregular heartbeat, joint pain Unusual bruising or bleeding Side effects that usually do not require medical attention (report to your care team if they continue or are bothersome): Change in nail shape, thickness, or color Change in taste Hair loss Increased tears This list may not describe all possible side effects. Call your doctor for medical advice about side effects. You may report side effects to FDA at 1-800-FDA-1088. Where should I keep my medication? This medication is given in a hospital or clinic. It will not be stored at home. NOTE: This sheet is a summary. It may not cover all possible information. If you have questions about this medicine, talk to your doctor,  pharmacist, or health care provider.  2024 Elsevier/Gold Standard (2021-07-06 00:00:00)

## 2023-04-19 NOTE — Progress Notes (Signed)
[facility] Spiritual Care Note  Received call from Steve's wife Susanne Borders, who knows me through [employer], for prayer support. Met together in lobby for emotional support and prayer, then met Brett Canales in infusion to get acquainted and share prayer together per request. Couple reports good support from family/community and regular meaning-making activities, welcoming prayer as another layer of support. Yvonnia Tango's brother is being treated for non-Hodgkin's lymphoma, which is an additional family stressor, particularly because couple is close with their siblings. Misty Stanley and Brett Canales are focusing on positivity and what is going well. Brett Canales reports that, aside from side effects from the injections, he often feels well enough to enjoy 18 holes of golf.  Plan to follow up in infusion, and couple has direct Spiritual Care number in case needs arise in the meantime.   [address] [place], [place], [facility] Pager [phone] Voicemail [phone]

## 2023-04-22 ENCOUNTER — Inpatient Hospital Stay: Payer: Medicare Other

## 2023-04-22 VITALS — BP 145/71 | HR 88 | Temp 97.9°F | Resp 17

## 2023-04-22 DIAGNOSIS — Z5111 Encounter for antineoplastic chemotherapy: Secondary | ICD-10-CM | POA: Diagnosis not present

## 2023-04-22 DIAGNOSIS — E785 Hyperlipidemia, unspecified: Secondary | ICD-10-CM | POA: Diagnosis not present

## 2023-04-22 DIAGNOSIS — C61 Malignant neoplasm of prostate: Secondary | ICD-10-CM

## 2023-04-22 DIAGNOSIS — C7951 Secondary malignant neoplasm of bone: Secondary | ICD-10-CM | POA: Diagnosis not present

## 2023-04-22 DIAGNOSIS — Z79899 Other long term (current) drug therapy: Secondary | ICD-10-CM | POA: Diagnosis not present

## 2023-04-22 MED ORDER — PEGFILGRASTIM-FPGK 6 MG/0.6ML ~~LOC~~ SOSY
6.0000 mg | PREFILLED_SYRINGE | Freq: Once | SUBCUTANEOUS | Status: AC
  Administered 2023-04-22: 6 mg via SUBCUTANEOUS
  Filled 2023-04-22: qty 0.6

## 2023-05-01 ENCOUNTER — Telehealth: Payer: Self-pay

## 2023-05-01 ENCOUNTER — Inpatient Hospital Stay: Payer: Medicare Other

## 2023-05-01 DIAGNOSIS — C61 Malignant neoplasm of prostate: Secondary | ICD-10-CM | POA: Diagnosis not present

## 2023-05-01 DIAGNOSIS — C7951 Secondary malignant neoplasm of bone: Secondary | ICD-10-CM | POA: Diagnosis not present

## 2023-05-01 DIAGNOSIS — Z95828 Presence of other vascular implants and grafts: Secondary | ICD-10-CM

## 2023-05-01 DIAGNOSIS — Z5111 Encounter for antineoplastic chemotherapy: Secondary | ICD-10-CM | POA: Diagnosis not present

## 2023-05-01 DIAGNOSIS — E785 Hyperlipidemia, unspecified: Secondary | ICD-10-CM | POA: Diagnosis not present

## 2023-05-01 DIAGNOSIS — Z79899 Other long term (current) drug therapy: Secondary | ICD-10-CM | POA: Diagnosis not present

## 2023-05-01 LAB — CBC WITH DIFFERENTIAL (CANCER CENTER ONLY)
Abs Immature Granulocytes: 0.36 10*3/uL — ABNORMAL HIGH (ref 0.00–0.07)
Basophils Absolute: 0.1 10*3/uL (ref 0.0–0.1)
Basophils Relative: 1 %
Eosinophils Absolute: 0 10*3/uL (ref 0.0–0.5)
Eosinophils Relative: 0 %
HCT: 30.7 % — ABNORMAL LOW (ref 39.0–52.0)
Hemoglobin: 10.5 g/dL — ABNORMAL LOW (ref 13.0–17.0)
Immature Granulocytes: 2 %
Lymphocytes Relative: 7 %
Lymphs Abs: 1.5 10*3/uL (ref 0.7–4.0)
MCH: 31.1 pg (ref 26.0–34.0)
MCHC: 34.2 g/dL (ref 30.0–36.0)
MCV: 90.8 fL (ref 80.0–100.0)
Monocytes Absolute: 1.2 10*3/uL — ABNORMAL HIGH (ref 0.1–1.0)
Monocytes Relative: 6 %
Neutro Abs: 17.9 10*3/uL — ABNORMAL HIGH (ref 1.7–7.7)
Neutrophils Relative %: 84 %
Platelet Count: 237 10*3/uL (ref 150–400)
RBC: 3.38 MIL/uL — ABNORMAL LOW (ref 4.22–5.81)
RDW: 14.6 % (ref 11.5–15.5)
WBC Count: 21 10*3/uL — ABNORMAL HIGH (ref 4.0–10.5)
nRBC: 0 % (ref 0.0–0.2)

## 2023-05-01 MED ORDER — DOXYCYCLINE HYCLATE 100 MG PO TABS: 100.0000 mg | ORAL_TABLET | Freq: Two times a day (BID) | ORAL | 0 refills | Status: AC

## 2023-05-01 MED ORDER — SODIUM CHLORIDE 0.9% FLUSH
10.0000 mL | Freq: Once | INTRAVENOUS | Status: AC
  Administered 2023-05-01: 10 mL

## 2023-05-01 MED ORDER — HEPARIN SOD (PORK) LOCK FLUSH 100 UNIT/ML IV SOLN
500.0000 [IU] | Freq: Once | INTRAVENOUS | Status: AC
  Administered 2023-05-01: 500 [IU]

## 2023-05-01 NOTE — Addendum Note (Signed)
Addended by: Orland Mustard C on: 05/01/2023 03:34 PM   Modules accepted: Orders

## 2023-05-01 NOTE — Telephone Encounter (Addendum)
TC from pt, reporting that he has had a HA and nasal congestion w/ low-grade fever x 4 days. He reports temp has been between 99.1 and 100.1. States several coworkers have been sick as well. He had a flu shot and reports a negative home COVID test (done today). Discussed w/ Dr. Cherly Hensen and new order received for a CBC. Pt aware to come in for port flush-lab appt at 2:45 and that he will be given instructions after CBC results are back.   1530: Dr. Cherly Hensen reviewed pt's CBC results. New order received and pt informed of new Rx. Per Dr. Nelta Numbers request, asked that pt contact office on 12/20 to report how he is feeling.

## 2023-05-16 ENCOUNTER — Inpatient Hospital Stay: Payer: Medicare Other

## 2023-05-16 VITALS — BP 153/70 | HR 70 | Temp 97.5°F | Resp 18 | Wt 177.8 lb

## 2023-05-16 DIAGNOSIS — C7951 Secondary malignant neoplasm of bone: Secondary | ICD-10-CM | POA: Diagnosis not present

## 2023-05-16 DIAGNOSIS — E785 Hyperlipidemia, unspecified: Secondary | ICD-10-CM | POA: Diagnosis not present

## 2023-05-16 DIAGNOSIS — C61 Malignant neoplasm of prostate: Secondary | ICD-10-CM

## 2023-05-16 DIAGNOSIS — E7849 Other hyperlipidemia: Secondary | ICD-10-CM

## 2023-05-16 DIAGNOSIS — Z7982 Long term (current) use of aspirin: Secondary | ICD-10-CM | POA: Diagnosis not present

## 2023-05-16 DIAGNOSIS — Z79899 Other long term (current) drug therapy: Secondary | ICD-10-CM | POA: Insufficient documentation

## 2023-05-16 DIAGNOSIS — Z5111 Encounter for antineoplastic chemotherapy: Secondary | ICD-10-CM | POA: Insufficient documentation

## 2023-05-16 DIAGNOSIS — Z5189 Encounter for other specified aftercare: Secondary | ICD-10-CM | POA: Diagnosis not present

## 2023-05-16 DIAGNOSIS — Z9189 Other specified personal risk factors, not elsewhere classified: Secondary | ICD-10-CM | POA: Diagnosis not present

## 2023-05-16 DIAGNOSIS — Z95828 Presence of other vascular implants and grafts: Secondary | ICD-10-CM

## 2023-05-16 LAB — CBC WITH DIFFERENTIAL (CANCER CENTER ONLY)
Abs Immature Granulocytes: 0.01 10*3/uL (ref 0.00–0.07)
Basophils Absolute: 0 10*3/uL (ref 0.0–0.1)
Basophils Relative: 0 %
Eosinophils Absolute: 0 10*3/uL (ref 0.0–0.5)
Eosinophils Relative: 0 %
HCT: 33.3 % — ABNORMAL LOW (ref 39.0–52.0)
Hemoglobin: 11.3 g/dL — ABNORMAL LOW (ref 13.0–17.0)
Immature Granulocytes: 0 %
Lymphocytes Relative: 9 %
Lymphs Abs: 0.4 10*3/uL — ABNORMAL LOW (ref 0.7–4.0)
MCH: 30.9 pg (ref 26.0–34.0)
MCHC: 33.9 g/dL (ref 30.0–36.0)
MCV: 91 fL (ref 80.0–100.0)
Monocytes Absolute: 0 10*3/uL — ABNORMAL LOW (ref 0.1–1.0)
Monocytes Relative: 1 %
Neutro Abs: 4.4 10*3/uL (ref 1.7–7.7)
Neutrophils Relative %: 90 %
Platelet Count: 323 10*3/uL (ref 150–400)
RBC: 3.66 MIL/uL — ABNORMAL LOW (ref 4.22–5.81)
RDW: 14.9 % (ref 11.5–15.5)
Smear Review: NORMAL
WBC Count: 4.9 10*3/uL (ref 4.0–10.5)
nRBC: 0 % (ref 0.0–0.2)

## 2023-05-16 LAB — CMP (CANCER CENTER ONLY)
ALT: 22 U/L (ref 0–44)
AST: 19 U/L (ref 15–41)
Albumin: 4.1 g/dL (ref 3.5–5.0)
Alkaline Phosphatase: 87 U/L (ref 38–126)
Anion gap: 7 (ref 5–15)
BUN: 21 mg/dL (ref 8–23)
CO2: 27 mmol/L (ref 22–32)
Calcium: 9.2 mg/dL (ref 8.9–10.3)
Chloride: 101 mmol/L (ref 98–111)
Creatinine: 0.95 mg/dL (ref 0.61–1.24)
GFR, Estimated: >60 mL/min (ref >60)
Glucose, Bld: 296 mg/dL — ABNORMAL HIGH (ref 70–99)
Potassium: 4 mmol/L (ref 3.5–5.1)
Sodium: 135 mmol/L (ref 135–145)
Total Bilirubin: 1 mg/dL (ref 0.0–1.2)
Total Protein: 7 g/dL (ref 6.5–8.1)

## 2023-05-16 MED ORDER — HEPARIN SOD (PORK) LOCK FLUSH 100 UNIT/ML IV SOLN
500.0000 [IU] | Freq: Once | INTRAVENOUS | Status: AC
  Administered 2023-05-16: 500 [IU]

## 2023-05-16 MED ORDER — SODIUM CHLORIDE 0.9% FLUSH
10.0000 mL | Freq: Once | INTRAVENOUS | Status: AC
  Administered 2023-05-16: 10 mL

## 2023-05-16 NOTE — Progress Notes (Signed)
 Patient Care Team: Tina Pauletta BROCKS, MD as PCP - General (Oncology) Pietro Redell RAMAN, MD as PCP - Cardiology (Cardiology) Terence Deward MICAEL JULIANNA., MD as Consulting Physician (Urology) [facility] [place] as Consulting Physician (Optometry) Vertell Pont, RN as Oncology Nurse Navigator Tina Pauletta BROCKS, MD as Consulting Physician (Oncology)  Clinic Day:  05/16/2023  Referring physician: Tina Pauletta BROCKS, MD  ASSESSMENT & PLAN:   Assessment & Plan: 73 y.o.man with h/o hypertension, hyperlipidemia, BPH here for follow up for metastatic prostate cancer.  He has excellent performance status despite his age.    Current diagnosis: mHSPC.  Initial diagnosis: mHSPC. PSA 12.4 with GG5 (Gleason 4+5=9). High volume disease with nonregional and regional lymph nodes, and 4 sites of bone metastases. Treatment: 12/2022. Started relugolix and 01/17/23 darolutamide  03/08/23 C1D1 docetazel 03/29/23 C2D1 docetaxel  04/18/23 PSA <0.1 04/19/23 C3D1 docetaxel  05/16/22 C4 docetaxel   Discussed with patient and wife, will complete 4 cycles of docetaxel , and continue with ARPI with ADT only.  He started to having some toxicity and side effects from docetaxel .  Prostate cancer metastatic to bone (HCC) C4 docetaxel  tomorrow Continue Orgovyx and darolutamide Follow up with me in 6 weeks with labs and then visit PSMA PET Monitor for signs of infection and neutropenic fever on docetaxel   At risk for side effect of medication At risk of neutropenic fever, infection and cytopenia complications 03/2023 normal bone mineral density study calcium  (1000-1200 mg daily from food and supplements) and vitamin D3 (1000 IU daily) Zometa on ADT after dental clearance. Recommend him to see his dentist.  Healthy diet to prevent diabetes Weight-bearing exercises (30 minutes per day) Limit alcohol consumption and avoid smoking He should continue to see his cardiology team to optimize his cardiac condition while on treatment for  active prostate cancer  Hyperlipidemia On atorvastatin  No signs of SE Continue monitor for toxicity with ARPI   Follow up in about 6 weeks. The patient understands the plans discussed today and is in agreement with them.  He knows to contact our office if he develops concerns prior to his next appointment.  Pauletta BROCKS Tina, MD  [facility] - [facility] [address] [place] [address] Dept: [phone] Dept Fax: [phone]   Orders Placed This Encounter  Procedures   NM PET (PSMA) SKULL TO MID THIGH    Standing Status:   Future    Expected Date:   06/18/2023    Expiration Date:   05/15/2024    If indicated for the ordered procedure, I authorize the administration of a radiopharmaceutical per Radiology protocol:   Yes    Preferred imaging location?:   [facility]     CHIEF COMPLAINT:  CC: mHSPC  Current Treatment:  triplet  INTERVAL HISTORY:  Trisha is here today for repeat clinical assessment. He denies fevers or chills.  No stomach pain, constipation, difficulty urinating. Some pain in the left foot with cracking. No neuropathy. No bleeding.   I have reviewed the past medical history, past surgical history, social history and family history with the patient and they are unchanged from previous note.  ALLERGIES:  is allergic to doxazosin mesylate and crestor  [rosuvastatin ].  MEDICATIONS:  Current Outpatient Medications  Medication Sig Dispense Refill   amLODipine  (NORVASC ) 5 MG tablet TAKE 1 TABLET (5 MG TOTAL) BY MOUTH DAILY. 90 tablet 3   aspirin  EC 81 MG tablet Take 81 mg by  mouth daily.     atorvastatin  (LIPITOR) 10 MG tablet TAKE 1 TABLET BY MOUTH EVERY DAY 90 tablet 3   benazepril (LOTENSIN) 40 MG tablet TAKE 1 TABLET BY MOUTH EVERY DAY 90 tablet 3   Cholecalciferol (VITAMIN D ) 125 MCG (5000 UT) CAPS Take 125 mcg by mouth daily.      Coenzyme Q10 (CO Q 10 PO) Take 1 capsule by mouth  daily.     darolutamide (NUBEQA) 300 MG tablet Take 600 mg by mouth 2 (two) times daily with a meal.     dexamethasone  (DECADRON ) 4 MG tablet Take 2 tabs by mouth 2 times daily starting day before chemo. Then take 2 tabs daily for 2 days starting day after chemo. Take with food. 30 tablet 1   famotidine (PEPCID) 10 MG tablet Take 10 mg by mouth as needed for heartburn or indigestion.     hydrochlorothiazide  (MICROZIDE ) 12.5 MG capsule TAKE 1 CAPSULE BY MOUTH EVERY DAY 90 capsule 2   lidocaine -prilocaine  (EMLA ) cream Apply to affected area once 30 g 3   Omega-3 Fatty Acids (OMEGA 3 PO) Take 1 capsule by mouth daily.      ondansetron  (ZOFRAN ) 8 MG tablet Take 1 tablet (8 mg total) by mouth every 8 (eight) hours as needed for nausea or vomiting. 30 tablet 1   prochlorperazine  (COMPAZINE ) 10 MG tablet Take 1 tablet (10 mg total) by mouth every 6 (six) hours as needed for nausea or vomiting. 30 tablet 1   psyllium (METAMUCIL) 58.6 % packet Take 2 packets by mouth 2 (two) times daily.      relugolix (ORGOVYX) 120 MG tablet Take 120 mg by mouth daily.     Naproxen  Sod-diphenhydrAMINE (ALEVE  PM) 220-25 MG TABS Take 1 tablet by mouth as needed.  (Patient not taking: Reported on 05/16/2023)     No current facility-administered medications for this visit.    HISTORY OF PRESENT ILLNESS:   Oncology History  Prostate cancer metastatic to bone (HCC)  07/15/2017 Tumor Marker   PSA 1.89   09/27/2017 Tumor Marker   PSA 2.26   11/06/2018 Tumor Marker   PSA 3.04   04/22/2019 Tumor Marker   PSA 2.97   12/23/2019 Tumor Marker   PSA 1.76   12/15/2020 Tumor Marker   PSA 2.43   12/18/2021 Tumor Marker   PSA 3.76   11/27/2022 Surgery   TURP for BPH with Lower urinary tract symptoms. High risk T1b GG5 (Gleason 4+5=9) in 30% of resected tissue.   12/24/2022 Tumor Marker   PSA 12.4   01/01/2023 PET scan   PSMA PET Radiotracer avid glandular noted on the right aspect of the prostate.  Potential more central  activity difficult to separate from the urine activity. Focal radiotracer activity within the right seminal vesicle consistent with prostate adenocarcinoma metastases Radiotracer avid right and left iliac lymph nodes consistent with nodal metastases.  Radiotracer avid periaortic retroperitoneal lymph node consistent with nodal metastasis Skeletal metastases including sternum, L3 vertebral body, and left iliac bone.   01/09/2023 -  Chemotherapy   Started Orgovyx 01/17/23 darolutamide   01/17/2023 Initial Diagnosis   Prostate cancer metastatic to bone (HCC)   01/18/2023 Cancer Staging   Staging form: Prostate, AJCC 8th Edition - Clinical: Stage IVB (cT1b, cN1, cM1b, PSA: 12.4, Grade Group: 5) - Signed by Tina Pauletta BROCKS, MD on 01/18/2023 Prostate specific antigen (PSA) range: 10 to 19 Gleason primary pattern: 4 Gleason secondary pattern: 5 Histologic grading system: 5 grade system  02/13/2023 Genetic Testing   [employer] Negative for pathologic variant   03/01/2023 Genetic Testing   Negative genetic testing on the CancerNext-Expanded+RNAinsight panel.  The report date is 03/01/2023.  The CancerNext-Expanded gene panel offered by [facility] and includes sequencing and rearrangement analysis for the following 77 genes: AIP, ALK, APC*, ATM*, AXIN2, BAP1, BARD1, BMPR1A, BRCA1*, BRCA2*, BRIP1*, CDC73, CDH1*, CDK4, CDKN1B, CDKN2A, CHEK2*, CTNNA1, DICER1, FH, FLCN, KIF1B, LZTR1, MAX, MEN1, MET, MLH1*, MSH2*, MSH3, MSH6*, MUTYH*, NF1*, NF2, NTHL1, PALB2*, PHOX2B, PMS2*, POT1, PRKAR1A, PTCH1, PTEN*, RAD51C*, RAD51D*, RB1, RET, SDHA, SDHAF2, SDHB, SDHC, SDHD, SMAD4, SMARCA4, SMARCB1, SMARCE1, STK11, SUFU, TMEM127, TP53*, TSC1, TSC2, and VHL (sequencing and deletion/duplication); EGFR, EGLN1, HOXB13, KIT, MITF, PDGFRA, POLD1, and POLE (sequencing only); EPCAM and GREM1 (deletion/duplication only). DNA and RNA analyses performed for * genes.    03/04/2023 Genetic Testing   Patient has genetic testing  done for prostate cancer. Results were negative for pathologic mutations from [employer] 71 genes panel.   03/08/2023 -  Chemotherapy   Patient is on Treatment Plan : PROSTATE Docetaxel  (75) q21d         REVIEW OF SYSTEMS:   All relevant systems were reviewed with the patient and are negative.   VITALS:  Blood pressure (!) 153/70, pulse 70, temperature (!) 97.5 F (36.4 C), temperature source Temporal, resp. rate 18, weight 177 lb 12.8 oz (80.6 kg), SpO2 100%.  Wt Readings from Last 3 Encounters:  05/16/23 177 lb 12.8 oz (80.6 kg)  04/19/23 175 lb (79.4 kg)  04/18/23 175 lb 8 oz (79.6 kg)    Body mass index is 26.26 kg/m.  Performance status (ECOG): 1 - Symptomatic but completely ambulatory  PHYSICAL EXAM:   GENERAL:alert, no distress and comfortable SKIN: skin color normal, no rashes  EYES: normal, sclera clear OROPHARYNX: no exudate, no erythema    LUNGS: clear to auscultation with normal breathing effort.  No wheeze or rales HEART: regular rate & rhythm and no murmurs and no lower extremity edema ABDOMEN: abdomen soft, non-tender and nondistended Musculoskeletal: no edema NEURO: alert, fluent speech  LABORATORY DATA:  I have reviewed the data as listed    Component Value Date/Time   NA 135 05/16/2023 1519   NA 137 12/30/2019 0944   K 4.0 05/16/2023 1519   CL 101 05/16/2023 1519   CO2 27 05/16/2023 1519   GLUCOSE 296 (H) 05/16/2023 1519   BUN 21 05/16/2023 1519   BUN 10 12/30/2019 0944   CREATININE 0.95 05/16/2023 1519   CALCIUM  9.2 05/16/2023 1519   PROT 7.0 05/16/2023 1519   PROT 6.9 04/21/2021 0943   ALBUMIN 4.1 05/16/2023 1519   ALBUMIN 4.5 04/21/2021 0943   AST 19 05/16/2023 1519   ALT 22 05/16/2023 1519   ALKPHOS 87 05/16/2023 1519   BILITOT 1.0 05/16/2023 1519   GFRNONAA >60 05/16/2023 1519   GFRAA 86 12/30/2019 0944    No results found for: SPEP, UPEP  Lab Results  Component Value Date   WBC 4.9 05/16/2023   NEUTROABS 4.4 05/16/2023    HGB 11.3 (L) 05/16/2023   HCT 33.3 (L) 05/16/2023   MCV 91.0 05/16/2023   PLT 323 05/16/2023      Chemistry      Component Value Date/Time   NA 135 05/16/2023 1519   NA 137 12/30/2019 0944   K 4.0 05/16/2023 1519   CL 101 05/16/2023 1519   CO2 27 05/16/2023 1519   BUN 21 05/16/2023 1519   BUN 10 12/30/2019 0944  CREATININE 0.95 05/16/2023 1519      Component Value Date/Time   CALCIUM  9.2 05/16/2023 1519   ALKPHOS 87 05/16/2023 1519   AST 19 05/16/2023 1519   ALT 22 05/16/2023 1519   BILITOT 1.0 05/16/2023 1519       RADIOGRAPHIC STUDIES: I have personally reviewed the radiological images as listed and agreed with the findings in the report. No results found.

## 2023-05-16 NOTE — Assessment & Plan Note (Signed)
 At risk of neutropenic fever, infection and cytopenia complications 03/2023 normal bone mineral density study calcium  (1000-1200 mg daily from food and supplements) and vitamin D3 (1000 IU daily) Zometa on ADT after dental clearance. Recommend him to see his dentist.  Healthy diet to prevent diabetes Weight-bearing exercises (30 minutes per day) Limit alcohol consumption and avoid smoking He should continue to see his cardiology team to optimize his cardiac condition while on treatment for active prostate cancer

## 2023-05-16 NOTE — Assessment & Plan Note (Addendum)
 C4 docetaxel tomorrow Continue Orgovyx and darolutamide Follow up with me in 6 weeks with labs and then visit PSMA PET Monitor for signs of infection and neutropenic fever on docetaxel

## 2023-05-16 NOTE — Assessment & Plan Note (Signed)
 On atorvastatin No signs of SE Continue monitor for toxicity with ARPI

## 2023-05-17 ENCOUNTER — Ambulatory Visit: Payer: Medicare Other

## 2023-05-17 ENCOUNTER — Inpatient Hospital Stay: Payer: Medicare Other

## 2023-05-17 ENCOUNTER — Other Ambulatory Visit: Payer: Medicare Other

## 2023-05-17 VITALS — BP 127/74 | HR 74 | Temp 98.0°F | Resp 20

## 2023-05-17 DIAGNOSIS — Z5111 Encounter for antineoplastic chemotherapy: Secondary | ICD-10-CM | POA: Diagnosis not present

## 2023-05-17 DIAGNOSIS — C61 Malignant neoplasm of prostate: Secondary | ICD-10-CM | POA: Diagnosis not present

## 2023-05-17 DIAGNOSIS — C7951 Secondary malignant neoplasm of bone: Secondary | ICD-10-CM | POA: Diagnosis not present

## 2023-05-17 DIAGNOSIS — Z5189 Encounter for other specified aftercare: Secondary | ICD-10-CM | POA: Diagnosis not present

## 2023-05-17 DIAGNOSIS — Z7982 Long term (current) use of aspirin: Secondary | ICD-10-CM | POA: Diagnosis not present

## 2023-05-17 DIAGNOSIS — E785 Hyperlipidemia, unspecified: Secondary | ICD-10-CM | POA: Diagnosis not present

## 2023-05-17 LAB — PSA, TOTAL AND FREE
PSA, Free Pct: >30 %
PSA, Free: 0.03 ng/mL
Prostate Specific Ag, Serum: <0.1 ng/mL (ref 0.0–4.0)

## 2023-05-17 LAB — TESTOSTERONE: Testosterone: <3 ng/dL — ABNORMAL LOW (ref 264–916)

## 2023-05-17 MED ORDER — SODIUM CHLORIDE 0.9% FLUSH
10.0000 mL | INTRAVENOUS | Status: DC | PRN
  Administered 2023-05-17: 10 mL

## 2023-05-17 MED ORDER — SODIUM CHLORIDE 0.9 % IV SOLN: INTRAVENOUS | Status: DC

## 2023-05-17 MED ORDER — DEXAMETHASONE SODIUM PHOSPHATE 10 MG/ML IJ SOLN
10.0000 mg | Freq: Once | INTRAMUSCULAR | Status: AC
  Administered 2023-05-17: 10 mg via INTRAVENOUS
  Filled 2023-05-17: qty 1

## 2023-05-17 MED ORDER — HEPARIN SOD (PORK) LOCK FLUSH 100 UNIT/ML IV SOLN
500.0000 [IU] | Freq: Once | INTRAVENOUS | Status: AC | PRN
  Administered 2023-05-17: 500 [IU]

## 2023-05-17 MED ORDER — SODIUM CHLORIDE 0.9 % IV SOLN
75.0000 mg/m2 | Freq: Once | INTRAVENOUS | Status: AC
  Administered 2023-05-17: 160 mg via INTRAVENOUS
  Filled 2023-05-17: qty 16

## 2023-05-17 NOTE — Progress Notes (Signed)
 Attempted to contact pt per Dr Cherly Hensen. Left pt a message :to let pt know good news PSA is less than detectable.  Instructed pt to call back with any questions.

## 2023-05-17 NOTE — Patient Instructions (Signed)
 [facility]  Discharge Instructions: Thank you for choosing [facility] to provide your oncology and hematology care.   If you have a lab appointment with the Cancer Center, please go directly to the Cancer Center and check in at the registration area.   Wear comfortable clothing and clothing appropriate for easy access to any Portacath or PICC line.   We strive to give you quality time with your provider. You may need to reschedule your appointment if you arrive late (15 or more minutes).  Arriving late affects you and other patients whose appointments are after yours.  Also, if you miss three or more appointments without notifying the office, you may be dismissed from the clinic at the provider's discretion.      For prescription refill requests, have your pharmacy contact our office and allow 72 hours for refills to be completed.    Today you received the following chemotherapy and/or immunotherapy agents: Docetaxel      To help prevent nausea and vomiting after your treatment, we encourage you to take your nausea medication as directed.  BELOW ARE SYMPTOMS THAT SHOULD BE REPORTED IMMEDIATELY: *FEVER GREATER THAN 100.4 F (38 C) OR HIGHER *CHILLS OR SWEATING *NAUSEA AND VOMITING THAT IS NOT CONTROLLED WITH YOUR NAUSEA MEDICATION *UNUSUAL SHORTNESS OF BREATH *UNUSUAL BRUISING OR BLEEDING *URINARY PROBLEMS (pain or burning when urinating, or frequent urination) *BOWEL PROBLEMS (unusual diarrhea, constipation, pain near the anus) TENDERNESS IN MOUTH AND THROAT WITH OR WITHOUT PRESENCE OF ULCERS (sore throat, sores in mouth, or a toothache) UNUSUAL RASH, SWELLING OR PAIN  UNUSUAL VAGINAL DISCHARGE OR ITCHING   Items with * indicate a potential emergency and should be followed up as soon as possible or go to the Emergency Department if any problems should occur.  Please show the CHEMOTHERAPY ALERT CARD or IMMUNOTHERAPY  ALERT CARD at check-in to the Emergency Department and triage nurse.  Should you have questions after your visit or need to cancel or reschedule your appointment, please contact [facility] - A DEPT OF [facility]  Dept: [phone]  and follow the prompts.  Office hours are 8:00 a.m. to 4:30 p.m. Monday - Friday. Please note that voicemails left after 4:00 p.m. may not be returned until the following business day.  We are closed weekends and major holidays. You have access to a nurse at all times for urgent questions. Please call the main number to the clinic Dept: [phone] and follow the prompts.   For any non-urgent questions, you may also contact your provider using MyChart. We now offer e-Visits for anyone 58 and older to request care online for non-urgent symptoms. For details visit [email or web address].   Also download the MyChart app! Go to the app store, search "MyChart", open the app, select , and log in with your MyChart username and password.`

## 2023-05-20 ENCOUNTER — Inpatient Hospital Stay: Payer: Medicare Other

## 2023-05-20 VITALS — BP 142/88 | HR 65 | Temp 97.7°F | Resp 18

## 2023-05-20 DIAGNOSIS — C61 Malignant neoplasm of prostate: Secondary | ICD-10-CM | POA: Diagnosis not present

## 2023-05-20 DIAGNOSIS — Z5111 Encounter for antineoplastic chemotherapy: Secondary | ICD-10-CM | POA: Diagnosis not present

## 2023-05-20 DIAGNOSIS — Z7982 Long term (current) use of aspirin: Secondary | ICD-10-CM | POA: Diagnosis not present

## 2023-05-20 DIAGNOSIS — C7951 Secondary malignant neoplasm of bone: Secondary | ICD-10-CM | POA: Diagnosis not present

## 2023-05-20 DIAGNOSIS — E785 Hyperlipidemia, unspecified: Secondary | ICD-10-CM | POA: Diagnosis not present

## 2023-05-20 DIAGNOSIS — Z5189 Encounter for other specified aftercare: Secondary | ICD-10-CM | POA: Diagnosis not present

## 2023-05-20 MED ORDER — PEGFILGRASTIM-FPGK 6 MG/0.6ML ~~LOC~~ SOSY
6.0000 mg | PREFILLED_SYRINGE | Freq: Once | SUBCUTANEOUS | Status: AC
  Administered 2023-05-20: 6 mg via SUBCUTANEOUS
  Filled 2023-05-20: qty 0.6

## 2023-05-31 ENCOUNTER — Other Ambulatory Visit: Payer: Self-pay | Admitting: Cardiology

## 2023-06-06 ENCOUNTER — Telehealth: Payer: Self-pay | Admitting: Radiation Oncology

## 2023-06-06 ENCOUNTER — Other Ambulatory Visit: Payer: Medicare Other

## 2023-06-06 ENCOUNTER — Ambulatory Visit: Payer: Medicare Other

## 2023-06-06 NOTE — Telephone Encounter (Signed)
1/23 4:15 pm Return call after patient left voicemail, for patient to confirm, if 2/17 at 10:00 am works for him.  Waiting on call back to confirm.

## 2023-06-06 NOTE — Telephone Encounter (Signed)
1/23 @ 2:35 pm Left voicemail for patient to call our office to be schedule for consult.

## 2023-06-07 ENCOUNTER — Ambulatory Visit: Payer: Medicare Other

## 2023-06-10 ENCOUNTER — Ambulatory Visit: Payer: Medicare Other

## 2023-06-13 ENCOUNTER — Other Ambulatory Visit: Payer: Self-pay | Admitting: Cardiology

## 2023-06-13 DIAGNOSIS — I10 Essential (primary) hypertension: Secondary | ICD-10-CM

## 2023-06-18 ENCOUNTER — Encounter (HOSPITAL_COMMUNITY)
Admission: RE | Admit: 2023-06-18 | Discharge: 2023-06-18 | Disposition: A | Discharge status: Home / Self Care | Payer: Medicare Other | Source: Ambulatory Visit

## 2023-06-18 ENCOUNTER — Other Ambulatory Visit: Payer: Self-pay

## 2023-06-18 DIAGNOSIS — C7951 Secondary malignant neoplasm of bone: Secondary | ICD-10-CM | POA: Diagnosis not present

## 2023-06-18 DIAGNOSIS — C61 Malignant neoplasm of prostate: Secondary | ICD-10-CM | POA: Insufficient documentation

## 2023-06-18 MED ORDER — FLOTUFOLASTAT F 18 GALLIUM 296-5846 MBQ/ML IV SOLN
8.2700 | Freq: Once | INTRAVENOUS | Status: AC
  Administered 2023-06-18: 8.27 via INTRAVENOUS

## 2023-06-18 NOTE — Progress Notes (Signed)
Chemotherapy treatment completed

## 2023-06-27 NOTE — Progress Notes (Signed)
RN placed request for PSMA PET to have read for upcoming follow up appointments.   RN will have images pushed to [facility] once final for his follow up on 2/27.

## 2023-06-28 ENCOUNTER — Ambulatory Visit: Payer: Medicare Other

## 2023-06-28 ENCOUNTER — Other Ambulatory Visit: Payer: Medicare Other

## 2023-06-28 ENCOUNTER — Ambulatory Visit: Payer: Medicare Other | Admitting: Nurse Practitioner

## 2023-06-28 NOTE — Assessment & Plan Note (Addendum)
 Continue Orgovyx and darolutamide Follow up with Rad onc today to discuss consolidation Follow up about every 3 months with labs.

## 2023-06-28 NOTE — Assessment & Plan Note (Signed)
03/2023 normal bone mineral density study calcium (1000-1200 mg daily from food and supplements) and vitamin D3 (1000 IU daily) Healthy diet to prevent diabetes Weight-bearing exercises (30 minutes per day) Limit alcohol consumption and avoid smoking He should continue to see his cardiology team to optimize his cardiac condition while on treatment for active prostate cancer

## 2023-06-28 NOTE — Progress Notes (Signed)
Additional request for STAT read of PSMA PET placed for upcoming consult.

## 2023-06-28 NOTE — Assessment & Plan Note (Signed)
On atorvastatin No signs of SE Continue monitor for toxicity with ARPI

## 2023-06-28 NOTE — Progress Notes (Unsigned)
 Patient Care Team: [name], MD as PCP - General (Internal Medicine) Jens Som Madolyn Frieze, MD as PCP - Cardiology (Cardiology) Bebe Shaggy., MD as Consulting Physician (Urology) [facility] [place] as Consulting Physician (Optometry) Cherlyn Cushing, RN as Oncology Nurse Navigator Melven Sartorius, MD as Consulting Physician (Oncology)  Clinic Day:  07/01/2023  Referring physician: Melven Sartorius, MD  ASSESSMENT & PLAN:   Assessment & Plan: 73 y.o.man with h/o hypertension, hyperlipidemia, BPH here for follow up for metastatic prostate cancer.  He has excellent performance status despite his age.    Current diagnosis: mHSPC.  Initial diagnosis: mHSPC. PSA 12.4 with GG5 (Gleason 4+5=9). High volume disease with nonregional and regional lymph nodes, and 4 sites of bone metastases. Germline: Negative genetic testing on the CancerNext-Expanded+RNAinsight pane  Somatic: not yet. Will perform upon progression Treatment: 12/2022. Started relugolix and 01/17/23 darolutamide  03/08/23 C1D1 docetazel 03/29/23 C2D1 docetaxel 04/18/23 PSA <0.1 04/19/23 C3D1 docetaxel 05/16/22 C4 docetaxel   He is getting daro and relugolix from [facility] monthly.  Discussed with patient and wife on his PET scan results.  Interval good response.  Recommend follow-up with Dr. Kathrynn Running to discuss consolidative radiation.  Recommend continue with ARPI with ADT.  Discussed importance of aggressive cardiovascular risk factor management.  Continue strength exercises, vitamin D, calcium.  Prostate cancer metastatic to bone (HCC) Continue Orgovyx and darolutamide Follow up with Rad onc today to discuss consolidation Follow up about every 3 months with labs.  Hyperlipidemia On atorvastatin No signs of SE Continue monitor for toxicity with ARPI  Essential hypertension Currently on amlodipine, hydrochlorothiazide and benazepril.  Monitor BP at home.  At risk for side effect of medication 03/2023  normal bone mineral density study calcium (1000-1200 mg daily from food and supplements) and vitamin D3 (1000 IU daily) Healthy diet to prevent diabetes Weight-bearing exercises (30 minutes per day) Limit alcohol consumption and avoid smoking He should continue to see his cardiology team to optimize his cardiac condition while on treatment for active prostate cancer   Follow-up in about 3 months with lab.  The patient understands the plans discussed today and is in agreement with them.  He knows to contact our office if he develops concerns prior to his next appointment.  Melven Sartorius, MD  [facility] DEPT [facility] [address] [place] [address] Dept: [phone] Dept Fax: [phone]   Orders Placed This Encounter  Procedures   IR Removal Tun Access W/ Port W/O [place]    Standing Status:   Future    Expected Date:   07/09/2023    Expiration Date:   06/30/2024    Reason for exam::   port removal    Preferred Imaging Location?:   [facility]      CHIEF COMPLAINT:  CC: Prostate cancer  Current Treatment: Completed Docetaxel portion of triplet therapy.  INTERVAL HISTORY:  Nicholas Paul is here today for repeat clinical assessment. He denies fevers or chills. He denies pain. His appetite is good. Energy is good. Hot flashes 1-2 per week. Taking vit D/calcium. No muscle pain. No neuropathy. Hair started to grow back.  He is getting daro and relugolix from [facility] monthly.  I have reviewed the past medical history, past surgical history, social history and family history with the patient and they are unchanged from previous note.  ALLERGIES:  is allergic to doxazosin mesylate and  crestor [rosuvastatin].  MEDICATIONS:  Current Outpatient Medications  Medication Sig Dispense Refill   amLODipine (NORVASC) 5 MG tablet TAKE 1 TABLET (5 MG TOTAL) BY MOUTH DAILY. 90 tablet 3   aspirin EC 81 MG tablet  Take 81 mg by mouth daily.     atorvastatin (LIPITOR) 10 MG tablet TAKE 1 TABLET BY MOUTH EVERY DAY 90 tablet 3   benazepril (LOTENSIN) 40 MG tablet TAKE 1 TABLET BY MOUTH EVERY DAY 90 tablet 2   Cholecalciferol (VITAMIN D) 125 MCG (5000 UT) CAPS Take 125 mcg by mouth daily.      Coenzyme Q10 (CO Q 10 PO) Take 1 capsule by mouth daily.     darolutamide (NUBEQA) 300 MG tablet Take 600 mg by mouth 2 (two) times daily with a meal.     famotidine (PEPCID) 10 MG tablet Take 10 mg by mouth as needed for heartburn or indigestion.     hydrochlorothiazide (MICROZIDE) 12.5 MG capsule TAKE 1 CAPSULE BY MOUTH EVERY DAY 90 capsule 3   Naproxen Sod-diphenhydrAMINE (ALEVE PM) 220-25 MG TABS Take 1 tablet by mouth as needed.  (Patient not taking: Reported on 05/16/2023)     Omega-3 Fatty Acids (OMEGA 3 PO) Take 1 capsule by mouth daily.      psyllium (METAMUCIL) 58.6 % packet Take 2 packets by mouth 2 (two) times daily.      relugolix (ORGOVYX) 120 MG tablet Take 120 mg by mouth daily.     No current facility-administered medications for this visit.    HISTORY OF PRESENT ILLNESS:   Oncology History  Prostate cancer metastatic to bone (HCC)  07/15/2017 Tumor Marker   PSA 1.89   09/27/2017 Tumor Marker   PSA 2.26   11/06/2018 Tumor Marker   PSA 3.04   04/22/2019 Tumor Marker   PSA 2.97   12/23/2019 Tumor Marker   PSA 1.76   12/15/2020 Tumor Marker   PSA 2.43   12/18/2021 Tumor Marker   PSA 3.76   11/27/2022 Surgery   TURP for BPH with Lower urinary tract symptoms. High risk T1b GG5 (Gleason 4+5=9) in 30% of resected tissue.   12/24/2022 Tumor Marker   PSA 12.4   01/01/2023 PET scan   PSMA PET Radiotracer avid glandular noted on the right aspect of the prostate.  Potential more central activity difficult to separate from the urine activity. Focal radiotracer activity within the right seminal vesicle consistent with prostate adenocarcinoma metastases Radiotracer avid right and left iliac lymph nodes  consistent with nodal metastases.  Radiotracer avid periaortic retroperitoneal lymph node consistent with nodal metastasis Skeletal metastases including sternum, L3 vertebral body, and left iliac bone.   01/09/2023 -  Chemotherapy   Started Orgovyx 01/17/23 darolutamide   01/17/2023 Initial Diagnosis   Prostate cancer metastatic to bone (HCC)   01/18/2023 Cancer Staging   Staging form: Prostate, AJCC 8th Edition - Clinical: Stage IVB (cT1b, cN1, cM1b, PSA: 12.4, Grade Group: 5) - Signed by Melven Sartorius, MD on 01/18/2023 Prostate specific antigen (PSA) range: 10 to 19 Gleason primary pattern: 4 Gleason secondary pattern: 5 Histologic grading system: 5 grade system   02/13/2023 Genetic Testing   [employer] Negative for pathologic variant   03/01/2023 Genetic Testing   Negative genetic testing on the CancerNext-Expanded+RNAinsight panel.  The report date is 03/01/2023.  The CancerNext-Expanded gene panel offered by [facility] and includes sequencing and rearrangement analysis for the following 77 genes: AIP, ALK, APC*, ATM*, AXIN2, BAP1, BARD1, BMPR1A, BRCA1*, BRCA2*, BRIP1*, CDC73,  CDH1*, CDK4, CDKN1B, CDKN2A, CHEK2*, CTNNA1, DICER1, FH, FLCN, KIF1B, LZTR1, MAX, MEN1, MET, MLH1*, MSH2*, MSH3, MSH6*, MUTYH*, NF1*, NF2, NTHL1, PALB2*, PHOX2B, PMS2*, POT1, PRKAR1A, PTCH1, PTEN*, RAD51C*, RAD51D*, RB1, RET, SDHA, SDHAF2, SDHB, SDHC, SDHD, SMAD4, SMARCA4, SMARCB1, SMARCE1, STK11, SUFU, TMEM127, TP53*, TSC1, TSC2, and VHL (sequencing and deletion/duplication); EGFR, EGLN1, HOXB13, KIT, MITF, PDGFRA, POLD1, and POLE (sequencing only); EPCAM and GREM1 (deletion/duplication only). DNA and RNA analyses performed for * genes.    03/04/2023 Genetic Testing   Patient has genetic testing done for prostate cancer. Results were negative for pathologic mutations from [facility] 71 genes panel.   03/08/2023 - 05/20/2023 Chemotherapy   Patient is on Treatment Plan : PROSTATE Docetaxel (74) q21d     06/18/2023  PET scan   PSMA PET IMPRESSION: 1. 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 2. Persistent right-sided prostatic tracer affinity, suspicious for residual disease. 3. Relatively similar osseous metastasis, presumably having undergone interval healing is evidenced by increased sclerosis.       REVIEW OF SYSTEMS:   All relevant systems were reviewed with the patient and are negative.   VITALS:  Blood pressure (!) 150/90, pulse 91, temperature (!) 97.2 F (36.2 C), temperature source Temporal, resp. rate 17, weight 182 lb 3.2 oz (82.6 kg), SpO2 100%.  Wt Readings from Last 3 Encounters:  07/01/23 182 lb 3.2 oz (82.6 kg)  05/16/23 177 lb 12.8 oz (80.6 kg)  04/19/23 175 lb (79.4 kg)    Body mass index is 26.91 kg/m.  Performance status (ECOG): 0 - Asymptomatic  PHYSICAL EXAM:   GENERAL:alert, no distress and comfortable EYES: normal, sclera clear LUNGS: clear to auscultation with normal breathing effort.  No wheeze or rales HEART: regular rate & rhythm and no murmurs and no lower extremity edema ABDOMEN: abdomen soft, non-tender and nondistended Musculoskeletal: no edema  LABORATORY DATA:  I have reviewed the data as listed    Component Value Date/Time   NA 135 05/16/2023 1519   NA 137 12/30/2019 0944   K 4.0 05/16/2023 1519   CL 101 05/16/2023 1519   CO2 27 05/16/2023 1519   GLUCOSE 296 (H) 05/16/2023 1519   BUN 21 05/16/2023 1519   BUN 10 12/30/2019 0944   CREATININE 0.95 05/16/2023 1519   CALCIUM 9.2 05/16/2023 1519   PROT 7.0 05/16/2023 1519   PROT 6.9 04/21/2021 0943   ALBUMIN 4.1 05/16/2023 1519   ALBUMIN 4.5 04/21/2021 0943   AST 19 05/16/2023 1519   ALT 22 05/16/2023 1519   ALKPHOS 87 05/16/2023 1519   BILITOT 1.0 05/16/2023 1519   GFRNONAA >60 05/16/2023 1519   GFRAA 86 12/30/2019 0944    No results found for: "SPEP", "UPEP"  Lab Results  Component Value Date   WBC 5.8 07/01/2023    NEUTROABS 3.4 07/01/2023   HGB 12.6 (L) 07/01/2023   HCT 36.4 (L) 07/01/2023   MCV 87.3 07/01/2023   PLT 181 07/01/2023      Chemistry      Component Value Date/Time   NA 135 05/16/2023 1519   NA 137 12/30/2019 0944   K 4.0 05/16/2023 1519   CL 101 05/16/2023 1519   CO2 27 05/16/2023 1519   BUN 21 05/16/2023 1519   BUN 10 12/30/2019 0944   CREATININE 0.95 05/16/2023 1519      Component Value Date/Time   CALCIUM 9.2 05/16/2023 1519   ALKPHOS 87 05/16/2023 1519   AST 19 05/16/2023 1519   ALT 22  05/16/2023 1519   BILITOT 1.0 05/16/2023 1519       RADIOGRAPHIC STUDIES: I have personally reviewed the radiological images as listed and agreed with the findings in the report. NM PET (PSMA) SKULL TO MID THIGH Result Date: 06/28/2023 CLINICAL DATA:  Prostate cancer. History of 4+5=9 adenocarcinoma. Restaging. EXAM: NUCLEAR MEDICINE PET SKULL BASE TO THIGH TECHNIQUE: 8.3 mCi Flotufolastat (Posluma) was injected intravenously. Full-ring PET imaging was performed from the skull base to thigh after the radiotracer. CT data was obtained and used for attenuation correction and anatomic localization. COMPARISON:  12/28/2022 FINDINGS: NECK No radiotracer activity in neck lymph nodes. Incidental CT finding: No cervical adenopathy. CHEST No radiotracer accumulation within mediastinal or hilar lymph nodes. No suspicious pulmonary nodules on the CT scan. Incidental CT finding: Right Port-A-Cath tip at high right atrium. Aortic and coronary artery calcification. Right lower lobe 2-3 mm nodule is unchanged 95/4. ABDOMEN/PELVIS Prostate: Posterolateral right mid gland tracer affinity at a S.U.V. max of 4.8 is more posteriorly positioned than the tracer affinity on the prior. The right seminal vesicle tracer affinity has resolved. Lymph nodes: Resolution of previously described abdominopelvic tracer avid nodes. Liver: No evidence of liver metastasis. Incidental CT finding: Double IVC. Normal adrenal glands.  2.4 cm right hepatic lobe cyst. SKELETON Right-sided sternal tracer avid sclerotic lesion measures a S.U.V. max of 4.2 today versus a S.U.V. max of 3.1 on the prior. Left L3 sclerotic lesion is slightly more well-defined today at 1.8 cm and a S.U.V. max of 4.1. Compare a S.U.V. max of 6.9 on the prior. IMPRESSION: 1. 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 2. Persistent right-sided prostatic tracer affinity, suspicious for residual disease. 3. Relatively similar osseous metastasis, presumably having undergone interval healing is evidenced by increased sclerosis. Electronically Signed   By: Jeronimo Greaves M.D.   On: 06/28/2023 13:59

## 2023-06-28 NOTE — Assessment & Plan Note (Signed)
Currently on amlodipine, hydrochlorothiazide and benazepril.  Monitor BP at home.

## 2023-07-01 ENCOUNTER — Other Ambulatory Visit: Payer: Self-pay | Admitting: *Deleted

## 2023-07-01 ENCOUNTER — Encounter: Payer: Self-pay | Admitting: Urology

## 2023-07-01 ENCOUNTER — Inpatient Hospital Stay: Payer: Medicare Other

## 2023-07-01 ENCOUNTER — Ambulatory Visit
Admission: RE | Admit: 2023-07-01 | Discharge: 2023-07-01 | Disposition: A | Discharge status: Home / Self Care | Payer: Medicare Other | Source: Ambulatory Visit | Attending: Urology | Admitting: Urology

## 2023-07-01 ENCOUNTER — Ambulatory Visit: Payer: Medicare Other

## 2023-07-01 VITALS — BP 144/80 | HR 71 | Temp 97.8°F | Resp 18 | Ht 69.0 in | Wt 180.6 lb

## 2023-07-01 VITALS — BP 150/90 | HR 91 | Temp 97.2°F | Resp 17 | Wt 182.2 lb

## 2023-07-01 DIAGNOSIS — C61 Malignant neoplasm of prostate: Secondary | ICD-10-CM | POA: Diagnosis not present

## 2023-07-01 DIAGNOSIS — Z7982 Long term (current) use of aspirin: Secondary | ICD-10-CM | POA: Diagnosis not present

## 2023-07-01 DIAGNOSIS — C7951 Secondary malignant neoplasm of bone: Secondary | ICD-10-CM | POA: Insufficient documentation

## 2023-07-01 DIAGNOSIS — E785 Hyperlipidemia, unspecified: Secondary | ICD-10-CM | POA: Diagnosis not present

## 2023-07-01 DIAGNOSIS — E7849 Other hyperlipidemia: Secondary | ICD-10-CM

## 2023-07-01 DIAGNOSIS — Z79899 Other long term (current) drug therapy: Secondary | ICD-10-CM | POA: Insufficient documentation

## 2023-07-01 DIAGNOSIS — Z9189 Other specified personal risk factors, not elsewhere classified: Secondary | ICD-10-CM | POA: Diagnosis not present

## 2023-07-01 DIAGNOSIS — Z95828 Presence of other vascular implants and grafts: Secondary | ICD-10-CM | POA: Diagnosis not present

## 2023-07-01 DIAGNOSIS — C778 Secondary and unspecified malignant neoplasm of lymph nodes of multiple regions: Secondary | ICD-10-CM

## 2023-07-01 DIAGNOSIS — Z191 Hormone sensitive malignancy status: Secondary | ICD-10-CM | POA: Diagnosis not present

## 2023-07-01 DIAGNOSIS — I10 Essential (primary) hypertension: Secondary | ICD-10-CM | POA: Diagnosis not present

## 2023-07-01 LAB — CBC WITH DIFFERENTIAL (CANCER CENTER ONLY)
Abs Immature Granulocytes: 0.01 10*3/uL (ref 0.00–0.07)
Basophils Absolute: 0.1 10*3/uL (ref 0.0–0.1)
Basophils Relative: 2 %
Eosinophils Absolute: 0.1 10*3/uL (ref 0.0–0.5)
Eosinophils Relative: 2 %
HCT: 36.4 % — ABNORMAL LOW (ref 39.0–52.0)
Hemoglobin: 12.6 g/dL — ABNORMAL LOW (ref 13.0–17.0)
Immature Granulocytes: 0 %
Lymphocytes Relative: 31 %
Lymphs Abs: 1.8 10*3/uL (ref 0.7–4.0)
MCH: 30.2 pg (ref 26.0–34.0)
MCHC: 34.6 g/dL (ref 30.0–36.0)
MCV: 87.3 fL (ref 80.0–100.0)
Monocytes Absolute: 0.4 10*3/uL (ref 0.1–1.0)
Monocytes Relative: 8 %
Neutro Abs: 3.4 10*3/uL (ref 1.7–7.7)
Neutrophils Relative %: 57 %
Platelet Count: 181 10*3/uL (ref 150–400)
RBC: 4.17 MIL/uL — ABNORMAL LOW (ref 4.22–5.81)
RDW: 12.7 % (ref 11.5–15.5)
WBC Count: 5.8 10*3/uL (ref 4.0–10.5)
nRBC: 0 % (ref 0.0–0.2)

## 2023-07-01 LAB — CMP (CANCER CENTER ONLY)
ALT: 21 U/L (ref 0–44)
AST: 19 U/L (ref 15–41)
Albumin: 4.1 g/dL (ref 3.5–5.0)
Alkaline Phosphatase: 61 U/L (ref 38–126)
Anion gap: 6 (ref 5–15)
BUN: 15 mg/dL (ref 8–23)
CO2: 29 mmol/L (ref 22–32)
Calcium: 9.5 mg/dL (ref 8.9–10.3)
Chloride: 102 mmol/L (ref 98–111)
Creatinine: 0.85 mg/dL (ref 0.61–1.24)
GFR, Estimated: >60 mL/min (ref >60)
Glucose, Bld: 105 mg/dL — ABNORMAL HIGH (ref 70–99)
Potassium: 4.2 mmol/L (ref 3.5–5.1)
Sodium: 137 mmol/L (ref 135–145)
Total Bilirubin: 1.2 mg/dL (ref 0.0–1.2)
Total Protein: 6.6 g/dL (ref 6.5–8.1)

## 2023-07-01 MED ORDER — SODIUM CHLORIDE 0.9% FLUSH
10.0000 mL | Freq: Once | INTRAVENOUS | Status: AC
  Administered 2023-07-01: 10 mL

## 2023-07-01 MED ORDER — HEPARIN SOD (PORK) LOCK FLUSH 100 UNIT/ML IV SOLN
500.0000 [IU] | Freq: Once | INTRAVENOUS | Status: AC
  Administered 2023-07-01: 500 [IU]

## 2023-07-01 NOTE — Progress Notes (Signed)
 RN requested for recent PSMA PET images to be pushed over to [facility] for upcoming follow up.

## 2023-07-01 NOTE — Progress Notes (Signed)
 Nursing interview for Prostate cancer metastatic to bone (HCC) Stage IVB (cT1b, cN1, cM1b, PSA: 12.4, Grade Group: 5). Skeletal metastases including sternum, L3 vertebral body, RT and LT iliac bone.    Patient identity verified x2.  Patient reports feeling great. Patient denies chest pain and any related issues at this time.  Meaningful use complete.  Vitals- BP (!) 144/80 (BP Location: Left Arm, Patient Position: Sitting, Cuff Size: Normal)   Pulse 71   Temp 97.8 F (36.6 C) (Temporal)   Resp 18   Ht 5\' 9"  (1.753 m)   Wt 180 lb 9.6 oz (81.9 kg)   SpO2 98%   BMI 26.67 kg/m   This concludes the interaction.  Ruel Favors, LPN

## 2023-07-01 NOTE — Progress Notes (Signed)
 Radiation Oncology         [phone] ________________________________  Follow Up New  Name: Nicholas Paul MRN: [id number]  Date of Service: 07/01/2023 DOB: 04/08/1951  CC:Nicholas Paul  Nicholas Paul   REFERRING PHYSICIAN: Melven Sartorius, Paul  DIAGNOSIS: 73 y/o man with oligometastatic, Gleason 4+5, adenocarcinoma of the prostate involving  abdominopelvic lymph nodes and 3 bony lesions with a pretreatment PSA of 13.7.    ICD-10-CM   1. Prostate cancer metastatic to bone (HCC)  C61    C79.51     2. Malignant neoplasm of prostate metastatic to lymph nodes of multiple sites [facility])  C61    C77.8       HISTORY OF PRESENT ILLNESS: Nicholas Paul is a 73 y.o. male seen at the request of Nicholas Paul. He has been followed by [facility] since at least 2006 for a history of BPH and ED. His PSA was WNL and stable between 2-3 over the years.  He was initially treated with finasteride but this was discontinued in 2009 and he was changed to Flomax daily.  Due to persistent LUTS, he underwent a thalium LVP on 07/14/19 and his LUTS greatly improved.  His PSA was 3.78 in August 2023. His urinary symptoms suddenly worsened in April 2024 so he underwent in-office cystoscopy on 10/25/22 showing moderate bladder neck contracture and an obstructing prostate with moderate hyperplasia. He was taken for TURP on 11/27/22 and surgical pathology revealed prostatic adenocarcinoma, Gleason 4+5 involving 30% of the resected tissue. A repeat PSA was obtained on 12/24/22 and showed a significant increase up to 12.4.  He underwent staging PSMA PET scan on 12/28/22 showing radiotracer-avid disease on the right aspect of the prostate, and potentially more central activity but difficult to separate from urine activity.  Additionally, there was focal radiotracer activity within the right seminal vesicle, bilateral iliac and periaortic retroperitoneal lymph nodes and skeletal metastases including the sternum, L3  vertebral body, and the left iliac bone. He was started on total androgen blockade with Orgovyx on 01/18/23 and darolutamide Nicholas Paul) on 01/25/23 and referred to Nicholas Paul for discussion of possible additional systemic treatment.   We met the patient on 01/29/23 to discuss radiotherapy options. He also requested a second opinion at [facility], which we arranged for 02/18/23. He ultimately decided to pursue systemic treatment here at [facility] and completed four cycles of docetaxel from 03/08/23 through 05/17/23, under the care of Nicholas Paul.  His PSA has remained undetectable since 04/18/2023.  He recently underwent restaging PSMA PET scan on 06/18/23 showing a complete metabolic response to therapy of the abdominopelvic nodal and right seminal vesicle metastases but persistent right-sided prostatic tracer affinity, suspicious for residual disease and relatively similar osseous metastases.  He has been kindly referred back to [place] today to discuss adjuvant/definitive radiotherapy options.  PREVIOUS RADIATION THERAPY: No  PAST MEDICAL HISTORY:  Past Medical History:  Diagnosis Date   BPH (benign prostatic hyperplasia)    urologist--- dr Nicholas Paul   Coronary artery disease cardiologist--- Nicholas Fiscal gerhardt NP   last CTA 02-17-2018  nonobstructive proximal and mid LAD, calcium score 15;   nuclear stress test 06-09-2003 in epic,  no ischemia w/ ef 59%   Family history of bladder cancer    Family history of prostate cancer    GERD (gastroesophageal reflux disease)    H/O: rheumatic fever    noted after tonsillectomy   Head injury, closed, initial encounter 03/26/2019   Hyperlipidemia  Hypertension    followed by cardiology   IBS (irritable bowel syndrome)    Dr Nicholas Paul   Internal hemorrhoids    Pulmonary nodule    4 mm RML nodule on CT scan in 12/12. Needs follow up in one year.         PAST SURGICAL HISTORY: Past Surgical History:  Procedure Laterality Date   COLONOSCOPY W/ POLYPECTOMY  2006   internal  hemorrhoids 2011   IR IMAGING GUIDED PORT INSERTION  03/05/2023   PROSTATE BIOPSY     SHOULDER ARTHROSCOPY WITH OPEN ROTATOR CUFF REPAIR Right 2022   THULIUM LASER TURP (TRANSURETHRAL RESECTION OF PROSTATE) N/A 07/14/2019   Procedure: THULIUM LASER TURP (TRANSURETHRAL RESECTION OF PROSTATE);  Surgeon: Nicholas Field, Paul;  Location: [facility];  Service: Urology;  Laterality: N/A;   TONSILLECTOMY AND ADENOIDECTOMY      FAMILY HISTORY:  Family History  Problem Relation Age of Onset   Hyperlipidemia Mother    Hypertension Mother    Bladder Cancer Father 54       heavy smoker   Bladder Cancer Brother        dx. 30s, non-smoker   Hypertension Brother    Brain cancer Maternal Uncle 8       glioblastoma   Bladder Cancer Paternal Aunt    Prostate cancer Paternal Grandfather        d. 16s   Prostate cancer Cousin        mat first cousin   Stroke Neg Hx    Diabetes Neg Hx    Heart attack Neg Hx    Colon cancer Neg Hx    Colon polyps Neg Hx    Esophageal cancer Neg Hx    Rectal cancer Neg Hx    Stomach cancer Neg Hx     SOCIAL HISTORY:  Social History   Socioeconomic History   Marital status: Married    Spouse name: Not on file   Number of children: 2   Years of education: Not on file   Highest education level: Some college, no degree  Occupational History    Employer: [employer]  Tobacco Use   Smoking status: Never    Passive exposure: Never   Smokeless tobacco: Never  Vaping Use   Vaping status: Never Used  Substance and Sexual Activity   Alcohol use: Yes    Comment: occasional   Drug use: Never   Sexual activity: Yes    Comment: vasectomy  Other Topics Concern   Not on file  Social History Narrative   ** Merged History Encounter **       Social Drivers of Health   Financial Resource Strain: Low Risk  (01/31/2023)   Received from [facility]   Overall Financial Resource Strain (CARDIA)    Difficulty of Paying Living Expenses:  Not hard at all  Food Insecurity: No Food Insecurity (07/01/2023)   Hunger Vital Sign    Worried About Running Out of Food in the Last Year: Never true    Ran Out of Food in the Last Year: Never true  Transportation Needs: No Transportation Needs (07/01/2023)   PRAPARE - Administrator, Civil Service (Medical): No    Lack of Transportation (Non-Medical): No  Physical Activity: Sufficiently Active (09/15/2022)   Exercise Vital Sign    Days of Exercise per Week: 2 days    Minutes of Exercise per Session: 150+ min  Stress: No Stress Concern Present (09/15/2022)  [employer] of Occupational Health - Occupational Stress Questionnaire    Feeling of Stress : Not at all  Social Connections: Moderately Integrated (09/15/2022)   Social Connection and Isolation Panel [NHANES]    Frequency of Communication with Friends and Family: Once a week    Frequency of Social Gatherings with Friends and Family: Once a week    Attends Religious Services: More than 4 times per year    Active Member of [employer] or Organizations: Yes    Attends Banker Meetings: 1 to 4 times per year    Marital Status: Married  Catering manager Violence: Not At Risk (07/01/2023)   Humiliation, Afraid, Rape, and Kick questionnaire    Fear of Current or Ex-Partner: No    Emotionally Abused: No    Physically Abused: No    Sexually Abused: No    ALLERGIES: Doxazosin mesylate and Crestor [rosuvastatin]  MEDICATIONS:  Current Outpatient Medications  Medication Sig Dispense Refill   amLODipine (NORVASC) 5 MG tablet TAKE 1 TABLET (5 MG TOTAL) BY MOUTH DAILY. 90 tablet 3   aspirin EC 81 MG tablet Take 81 mg by mouth daily.     atorvastatin (LIPITOR) 10 MG tablet TAKE 1 TABLET BY MOUTH EVERY DAY 90 tablet 3   benazepril (LOTENSIN) 40 MG tablet TAKE 1 TABLET BY MOUTH EVERY DAY 90 tablet 2   Cholecalciferol (VITAMIN D) 125 MCG (5000 UT) CAPS Take 125 mcg by mouth daily.      Coenzyme Q10 (CO Q 10 PO) Take 1  capsule by mouth daily.     darolutamide (NUBEQA) 300 MG tablet Take 600 mg by mouth 2 (two) times daily with a meal.     famotidine (PEPCID) 10 MG tablet Take 10 mg by mouth as needed for heartburn or indigestion.     hydrochlorothiazide (MICROZIDE) 12.5 MG capsule TAKE 1 CAPSULE BY MOUTH EVERY DAY 90 capsule 3   Naproxen Sod-diphenhydrAMINE (ALEVE PM) 220-25 MG TABS Take 1 tablet by mouth as needed.  (Patient not taking: Reported on 05/16/2023)     Omega-3 Fatty Acids (OMEGA 3 PO) Take 1 capsule by mouth daily.      psyllium (METAMUCIL) 58.6 % packet Take 2 packets by mouth 2 (two) times daily.      relugolix (ORGOVYX) 120 MG tablet Take 120 mg by mouth daily.     No current facility-administered medications for this encounter.    REVIEW OF SYSTEMS:  On review of systems, the patient reports that he is doing well overall. He denies any chest pain, shortness of breath, cough, fevers, chills, night sweats, unintended weight changes. He denies any bowel or bladder disturbances, and denies abdominal pain, nausea or vomiting. He denies any new musculoskeletal or joint aches or pains. His IPSS score is 5, indicating mild urinary symptoms only. His SHIM score is 16, indicating mild to moderate erectile dysfunction. A complete review of systems is obtained and is otherwise negative.    PHYSICAL EXAM:  Wt Readings from Last 3 Encounters:  07/01/23 180 lb 9.6 oz (81.9 kg)  07/01/23 182 lb 3.2 oz (82.6 kg)  05/16/23 177 lb 12.8 oz (80.6 kg)   Temp Readings from Last 3 Encounters:  07/01/23 97.8 F (36.6 C) (Temporal)  07/01/23 (!) 97.2 F (36.2 C) (Temporal)  05/20/23 97.7 F (36.5 C) (Oral)   BP Readings from Last 3 Encounters:  07/01/23 (!) 144/80  07/01/23 (!) 150/90  05/20/23 (!) 142/88   Pulse Readings from Last 3 Encounters:  07/01/23  71  07/01/23 91  05/20/23 65   Pain Assessment Pain Score: 0-No pain/10  In general this is a well appearing Caucasian man in no acute distress.  He's alert and oriented x4 and appropriate throughout the examination. Cardiopulmonary assessment is negative for acute distress and he exhibits normal effort.     KPS = 100  100 - Normal; no complaints; no evidence of disease. 90   - Able to carry on normal activity; minor signs or symptoms of disease. 80   - Normal activity with effort; some signs or symptoms of disease. 98   - Cares for self; unable to carry on normal activity or to do active work. 60   - Requires occasional assistance, but is able to care for most of his personal needs. 50   - Requires considerable assistance and frequent medical care. 40   - Disabled; requires special care and assistance. 30   - Severely disabled; hospital admission is indicated although death not imminent. 20   - Very sick; hospital admission necessary; active supportive treatment necessary. 10   - Moribund; fatal processes progressing rapidly. 0     - Dead  Karnofsky DA, Abelmann WH, Craver LS and Burchenal JH [phone]) The use of the nitrogen mustards in the palliative treatment of carcinoma: with particular reference to bronchogenic carcinoma Cancer 1 634-56  LABORATORY DATA:  Lab Results  Component Value Date   WBC 5.8 07/01/2023   HGB 12.6 (L) 07/01/2023   HCT 36.4 (L) 07/01/2023   MCV 87.3 07/01/2023   PLT 181 07/01/2023   Lab Results  Component Value Date   NA 137 07/01/2023   K 4.2 07/01/2023   CL 102 07/01/2023   CO2 29 07/01/2023   Lab Results  Component Value Date   ALT 21 07/01/2023   AST 19 07/01/2023   ALKPHOS 61 07/01/2023   BILITOT 1.2 07/01/2023     RADIOGRAPHY: NM PET (PSMA) SKULL TO MID THIGH Result Date: 06/28/2023 CLINICAL DATA:  Prostate cancer. History of 4+5=9 adenocarcinoma. Restaging. EXAM: NUCLEAR MEDICINE PET SKULL BASE TO THIGH TECHNIQUE: 8.3 mCi Flotufolastat (Posluma) was injected intravenously. Full-ring PET imaging was performed from the skull base to thigh after the radiotracer. CT data was obtained and  used for attenuation correction and anatomic localization. COMPARISON:  12/28/2022 FINDINGS: NECK No radiotracer activity in neck lymph nodes. Incidental CT finding: No cervical adenopathy. CHEST No radiotracer accumulation within mediastinal or hilar lymph nodes. No suspicious pulmonary nodules on the CT scan. Incidental CT finding: Right Port-A-Cath tip at high right atrium. Aortic and coronary artery calcification. Right lower lobe 2-3 mm nodule is unchanged 95/4. ABDOMEN/PELVIS Prostate: Posterolateral right mid gland tracer affinity at a S.U.V. max of 4.8 is more posteriorly positioned than the tracer affinity on the prior. The right seminal vesicle tracer affinity has resolved. Lymph nodes: Resolution of previously described abdominopelvic tracer avid nodes. Liver: No evidence of liver metastasis. Incidental CT finding: Double IVC. Normal adrenal glands. 2.4 cm right hepatic lobe cyst. SKELETON Right-sided sternal tracer avid sclerotic lesion measures a S.U.V. max of 4.2 today versus a S.U.V. max of 3.1 on the prior. Left L3 sclerotic lesion is slightly more well-defined today at 1.8 cm and a S.U.V. max of 4.1. Compare a S.U.V. max of 6.9 on the prior. IMPRESSION: 1. 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 2. Persistent right-sided prostatic tracer affinity, suspicious for residual disease. 3. Relatively similar osseous metastasis, presumably having undergone interval healing  is evidenced by increased sclerosis. Electronically Signed   By: Jeronimo Greaves M.D.   On: 06/28/2023 13:59      IMPRESSION/PLAN: 1. 73 y.o. man with oligometastatic, Gleason 4+5, adenocarcinoma of the prostate involving  abdominopelvic lymph nodes and 3 bony lesions with a pretreatment PSA of 13.7.  Today, we talked to the patient and his wife, Misty Stanley, about the findings and workup thus far. The patient's T stage, Gleason's score, and PSA put him into the high risk group and  PSMA PET scan confirmed small volume nodal and osseous metastases.  His PSA has remained undetectable, indicating he has had a good response to neoadjuvant systemic therapy and is eligible for 8 weeks of external radiation to include the prostate primary as well as the sites of oligometastases in the abdomen and pelvis while simultaneously treating the sternal metastasis with a 5 fraction course of SBRT, concurrent with his total androgen deprivation with Orgovyx and Nubeqa.  We discussed the available radiation techniques, and focused on the details and logistics of delivery. We discussed and outlined the risks, benefits, short and long-term effects associated with radiotherapy and compared and contrasted these with prostatectomy. We discussed the role of SpaceOAR gel in reducing the rectal toxicity associated with radiotherapy. He appears to have a good understanding of his disease and our treatment recommendations which are of curative intent. He understands that while there is a low likelihood for cure with this treatment approach, there is a high likelihood for good, long-term/durable control of the prostate cancer.  He and his wife were encouraged to ask questions that were answered to their stated satisfaction.  At the end of our discussion, the patient is in agreement to proceed with the recommended 8 week course of daily external beam therapy to the prostate primary and the sites of oligometastases while simultaneously treating the sternal lesion with a 5 fraction course of SBRT, all concurrent with total androgen blockade.  He is freely signed written consent to proceed today in the office and a copy of this document will be placed in his medical record.  We will share our discussion with Nicholas Paul and Dr. Mena Paul and make arrangements for fiducial markers and SpaceOAR gel placement, first available, in anticipation of beginning his daily radiation treatments in the near future.  We enjoyed meeting with  him and his wife again today and look forward to continuing to participate in his care.  We personally spent 60 minutes in this encounter including chart review, reviewing radiological studies, meeting face-to-face with the patient, entering orders and completing documentation.    Marguarite Arbour, PA-C    Margaretmary Dys, Paul  [facility]  Radiation Oncology Direct Dial: [phone]  Fax: [phone] [email or web address]  Skype  [email or web address]   This document serves as a record of services personally performed by Margaretmary Dys, Paul and Marcello Fennel, PA-C. It was created on their behalf by Mickie Bail, a trained medical scribe. The creation of this record is based on the scribe's personal observations and the provider's statements to them. This document has been checked and approved by the attending provider.

## 2023-07-02 ENCOUNTER — Telehealth: Payer: Self-pay | Admitting: *Deleted

## 2023-07-02 LAB — TESTOSTERONE: Testosterone: 3 ng/dL — ABNORMAL LOW (ref 264–916)

## 2023-07-02 LAB — PSA, TOTAL AND FREE
PSA, Free Pct: UNDETERMINED %
PSA, Free: <0.02 ng/mL
Prostate Specific Ag, Serum: <0.1 ng/mL (ref 0.0–4.0)

## 2023-07-02 NOTE — Telephone Encounter (Signed)
-----   Message from Melven Sartorius sent at 07/02/2023  9:13 AM EST ----- Please let him know good news, PSA continues to be < 0.1.  Follow-up with radiation per Dr. Broadus John recommendation.

## 2023-07-02 NOTE — Telephone Encounter (Signed)
 LM with note below

## 2023-07-04 ENCOUNTER — Telehealth: Payer: Self-pay | Admitting: *Deleted

## 2023-07-04 NOTE — Telephone Encounter (Signed)
 CALLED PATIENT TO INFORM OF FID. MARKER AND SPACE OAR PLACEMENT ON 08-09-23 AND HIS SIM ON 08-12-23- ARRIVAL TIME- 9:45 AM , INFORMED PATIENT TO ARRIVE WITH A FULL BLADDER, SPOKE WITH PATIENT AND HE IS AWARE OF THESE APPTS. AND THE INSTRUCTIONS

## 2023-07-11 DIAGNOSIS — C61 Malignant neoplasm of prostate: Secondary | ICD-10-CM | POA: Diagnosis not present

## 2023-07-12 NOTE — Progress Notes (Addendum)
 RN left message for call back to assess any barriers or questions prior to upcoming radiation.   RN spoke with patient and no barriers identified at this time.

## 2023-07-15 ENCOUNTER — Ambulatory Visit (HOSPITAL_COMMUNITY)
Admission: RE | Admit: 2023-07-15 | Discharge: 2023-07-15 | Disposition: A | Discharge status: Home / Self Care | Payer: Medicare Other | Source: Ambulatory Visit

## 2023-07-15 DIAGNOSIS — Z452 Encounter for adjustment and management of vascular access device: Secondary | ICD-10-CM | POA: Diagnosis not present

## 2023-07-15 DIAGNOSIS — C7951 Secondary malignant neoplasm of bone: Secondary | ICD-10-CM | POA: Diagnosis not present

## 2023-07-15 DIAGNOSIS — C61 Malignant neoplasm of prostate: Secondary | ICD-10-CM | POA: Diagnosis not present

## 2023-07-15 DIAGNOSIS — Z95828 Presence of other vascular implants and grafts: Secondary | ICD-10-CM

## 2023-07-15 HISTORY — PX: IR REMOVAL TUN ACCESS W/ PORT W/O FL MOD SED: IMG2290

## 2023-07-15 MED ORDER — LIDOCAINE-EPINEPHRINE 1 %-1:100000 IJ SOLN
INTRAMUSCULAR | Status: AC
  Filled 2023-07-15: qty 1

## 2023-07-15 MED ORDER — LIDOCAINE-EPINEPHRINE 1 %-1:100000 IJ SOLN
20.0000 mL | Freq: Once | INTRAMUSCULAR | Status: AC
  Administered 2023-07-15: 10 mL via INTRADERMAL

## 2023-07-22 ENCOUNTER — Ambulatory Visit (INDEPENDENT_AMBULATORY_CARE_PROVIDER_SITE_OTHER): Payer: Medicare Other

## 2023-07-22 VITALS — Ht 69.0 in | Wt 180.0 lb

## 2023-07-22 DIAGNOSIS — Z0000 Encounter for general adult medical examination without abnormal findings: Secondary | ICD-10-CM

## 2023-07-22 NOTE — Patient Instructions (Signed)
 Nicholas Paul , Thank you for taking time to come for your Medicare Wellness Visit. I appreciate your ongoing commitment to your health goals. Please review the following plan we discussed and let me know if I can assist you in the future.   Referrals/Orders/Follow-Ups/Clinician Recommendations: It was nice talking with you today.  You are due for a Tetanus and a Shingles vaccine.  Each day, aim for 6 glasses of water, plenty of protein in your diet and try to get up and walk/ stretch every hour for 5-10 minutes at a time.    This is a list of the screening recommended for you and due dates:  Health Maintenance  Topic Date Due   Zoster (Shingles) Vaccine (1 of 2) Never done   DTaP/Tdap/Td vaccine (2 - Td or Tdap) 01/03/2021   COVID-19 Vaccine (7 - 2024-25 season) 01/13/2023   Medicare Annual Wellness Visit  07/21/2024   Colon Cancer Screening  01/23/2027   Pneumonia Vaccine  Completed   Flu Shot  Completed   Hepatitis C Screening  Completed   HPV Vaccine  Aged Out    Advanced directives: (Declined) Advance directive discussed with you today. Even though you declined this today, please call our office should you change your mind, and we can give you the proper paperwork for you to fill out.  Next Medicare Annual Wellness Visit scheduled for next year: Yes

## 2023-07-22 NOTE — Progress Notes (Signed)
 Subjective:   Nicholas Paul is a 73 y.o. who presents for a Medicare Wellness preventive visit.  Visit Complete: Virtual I connected with  Nicholas Paul on 07/22/23 by a audio enabled telemedicine application and verified that I am speaking with the correct person using two identifiers.  Patient Location: Home  Provider Location: Home Office  I discussed the limitations of evaluation and management by telemedicine. The patient expressed understanding and agreed to proceed.  Vital Signs: Because this visit was a virtual/telehealth visit, some criteria may be missing or patient reported. Any vitals not documented were not able to be obtained and vitals that have been documented are patient reported.  VideoDeclined- This patient declined Librarian, academic. Therefore the visit was completed with audio only.  AWV Questionnaire: Yes: Patient Medicare AWV questionnaire was completed by the patient on 07/18/2023; I have confirmed that all information answered by patient is correct and no changes since this date.  Cardiac Risk Factors include: advanced age (>29men, >29 women);male gender;hypertension;dyslipidemia;Other (see comment), Risk factor comments: Prostate cancer metastatic to bone     Objective:    Today's Vitals   07/22/23 0832  Weight: 180 lb (81.6 kg)  Height: 5\' 9"  (1.753 m)   Body mass index is 26.58 kg/m.     07/22/2023    8:54 AM 07/01/2023   10:22 AM 04/19/2023    8:51 AM 03/05/2023    8:08 AM 01/29/2023    8:47 AM 01/18/2023    8:37 AM 07/18/2022    8:50 AM  Advanced Directives  Does Patient Have a Medical Advance Directive? No No No No No No No  Would patient like information on creating a medical advance directive?   No - Patient declined No - Patient declined  No - Patient declined No - Patient declined    Current Medications (verified) Outpatient Encounter Medications as of 07/22/2023  Medication Sig   amLODipine (NORVASC) 5 MG  tablet TAKE 1 TABLET (5 MG TOTAL) BY MOUTH DAILY.   aspirin EC 81 MG tablet Take 81 mg by mouth daily.   atorvastatin (LIPITOR) 10 MG tablet TAKE 1 TABLET BY MOUTH EVERY DAY   benazepril (LOTENSIN) 40 MG tablet TAKE 1 TABLET BY MOUTH EVERY DAY   Cholecalciferol (VITAMIN D) 125 MCG (5000 UT) CAPS Take 125 mcg by mouth daily.    Coenzyme Q10 (CO Q 10 PO) Take 1 capsule by mouth daily.   darolutamide (NUBEQA) 300 MG tablet Take 600 mg by mouth 2 (two) times daily with a meal.   famotidine (PEPCID) 10 MG tablet Take 10 mg by mouth as needed for heartburn or indigestion.   hydrochlorothiazide (MICROZIDE) 12.5 MG capsule TAKE 1 CAPSULE BY MOUTH EVERY DAY   Naproxen Sod-diphenhydrAMINE (ALEVE PM) 220-25 MG TABS Take 1 tablet by mouth as needed.   Omega-3 Fatty Acids (OMEGA 3 PO) Take 1 capsule by mouth daily.    psyllium (METAMUCIL) 58.6 % packet Take 2 packets by mouth 2 (two) times daily.    relugolix (ORGOVYX) 120 MG tablet Take 120 mg by mouth daily.   No facility-administered encounter medications on file as of 07/22/2023.    Allergies (verified) Doxazosin mesylate and Crestor [rosuvastatin]   History: Past Medical History:  Diagnosis Date   BPH (benign prostatic hyperplasia)    urologist--- dr Mena Goes   Coronary artery disease cardiologist--- Lawson Fiscal gerhardt NP   last CTA 02-17-2018  nonobstructive proximal and mid LAD, calcium score 15;   nuclear stress test  06-09-2003 in epic,  no ischemia w/ ef 59%   Family history of bladder cancer    Family history of prostate cancer    GERD (gastroesophageal reflux disease)    H/O: rheumatic fever    noted after tonsillectomy   Head injury, closed, initial encounter 03/26/2019   Hyperlipidemia    Hypertension    followed by cardiology   IBS (irritable bowel syndrome)    Dr Leone Payor   Internal hemorrhoids    Pulmonary nodule    4 mm RML nodule on CT scan in 12/12. Needs follow up in one year.      Past Surgical History:  Procedure  Laterality Date   COLONOSCOPY W/ POLYPECTOMY  2006   internal hemorrhoids 2011   IR IMAGING GUIDED PORT INSERTION  03/05/2023   IR REMOVAL TUN ACCESS W/ PORT W/O FL MOD SED  07/15/2023   PROSTATE BIOPSY     SHOULDER ARTHROSCOPY WITH OPEN ROTATOR CUFF REPAIR Right 2022   THULIUM LASER TURP (TRANSURETHRAL RESECTION OF PROSTATE) N/A 07/14/2019   Procedure: THULIUM LASER TURP (TRANSURETHRAL RESECTION OF PROSTATE);  Surgeon: Jerilee Field, MD;  Location: [facility];  Service: Urology;  Laterality: N/A;   TONSILLECTOMY AND ADENOIDECTOMY     Family History  Problem Relation Age of Onset   Hyperlipidemia Mother    Hypertension Mother    Bladder Cancer Father 76       heavy smoker   Bladder Cancer Brother        dx. 30s, non-smoker   Hypertension Brother    Brain cancer Maternal Uncle 49       glioblastoma   Bladder Cancer Paternal Aunt    Prostate cancer Paternal Grandfather        d. 56s   Prostate cancer Cousin        mat first cousin   Stroke Neg Hx    Diabetes Neg Hx    Heart attack Neg Hx    Colon cancer Neg Hx    Colon polyps Neg Hx    Esophageal cancer Neg Hx    Rectal cancer Neg Hx    Stomach cancer Neg Hx    Social History   Socioeconomic History   Marital status: Married    Spouse name: Misty Stanley   Number of children: 2   Years of education: Not on file   Highest education level: Some college, no degree  Occupational History    Employer: [employer]  Tobacco Use   Smoking status: Never    Passive exposure: Never   Smokeless tobacco: Never  Vaping Use   Vaping status: Never Used  Substance and Sexual Activity   Alcohol use: Yes    Comment: occasional   Drug use: Never   Sexual activity: Yes    Comment: vasectomy  Other Topics Concern   Not on file  Social History Narrative   ** Merged History Encounter **      Lives  at home with wife   Social Drivers of Corporate investment banker Strain: Low Risk  (07/18/2023)   Overall Financial  Resource Strain (CARDIA)    Difficulty of Paying Living Expenses: Not hard at all  Food Insecurity: No Food Insecurity (07/18/2023)   Hunger Vital Sign    Worried About Running Out of Food in the Last Year: Never true    Ran Out of Food in the Last Year: Never true  Transportation Needs: No Transportation Needs (07/18/2023)   PRAPARE - Transportation  Lack of Transportation (Medical): No    Lack of Transportation (Non-Medical): No  Physical Activity: Insufficiently Active (07/18/2023)   Exercise Vital Sign    Days of Exercise per Week: 1 day    Minutes of Exercise per Session: 40 min  Stress: No Stress Concern Present (07/18/2023)   [employer] of Occupational Health - Occupational Stress Questionnaire    Feeling of Stress : Only a little  Social Connections: Moderately Integrated (07/18/2023)   Social Connection and Isolation Panel [NHANES]    Frequency of Communication with Friends and Family: Once a week    Frequency of Social Gatherings with Friends and Family: Once a week    Attends Religious Services: More than 4 times per year    Active Member of [employer] or Organizations: Yes    Attends Engineer, structural: More than 4 times per year    Marital Status: Married    Tobacco Counseling Counseling given: Not Answered    Clinical Intake:  Pre-visit preparation completed: Yes  Pain : No/denies pain     BMI - recorded: 26.58 Nutritional Status: BMI 25 -29 Overweight Nutritional Risks: None Diabetes: No  How often do you need to have someone help you when you read instructions, pamphlets, or other written materials from your doctor or pharmacy?: 1 - Never  Interpreter Needed?: No  Information entered by :: Dishawn Bhargava, RMA   Activities of Daily Living     07/18/2023    1:24 PM 03/05/2023    8:02 AM  In your present state of health, do you have any difficulty performing the following activities:  Hearing? 0 0  Vision? 0 0  Difficulty concentrating or  making decisions? 0 0  Walking or climbing stairs? 0   Dressing or bathing? 0   Doing errands, shopping? 0   Preparing Food and eating ? N   Using the Toilet? N   In the past six months, have you accidently leaked urine? N   Do you have problems with loss of bowel control? N   Managing your Medications? N   Managing your Finances? N   Housekeeping or managing your Housekeeping? N     Patient Care Team: [name], MD as PCP - General (Internal Medicine) Jens Som Madolyn Frieze, MD as PCP - Cardiology (Cardiology) Bebe Shaggy., MD as Consulting Physician (Urology) [facility] [place] as Consulting Physician (Optometry) Cherlyn Cushing, RN as Oncology Nurse Navigator Melven Sartorius, MD as Consulting Physician (Oncology) Shea Evans, Genice Rouge [facility])  Indicate any recent Medical Services you may have received from other than [facility] providers in the past year (date may be approximate).     Assessment:   This is a routine wellness examination for Nicholas Paul.  Hearing/Vision screen Hearing Screening - Comments:: Wears hearing aides Vision Screening - Comments:: Wears eyeglasses   Goals Addressed             This Visit's Progress    Client understands the importance of follow-up with providers by attending scheduled visits   On track    My goal is to stay healthy and well.       Depression Screen     07/22/2023    8:56 AM 01/29/2023    8:56 AM 11/05/2022    1:01 PM 07/18/2022    8:53 AM 12/22/2021    8:59 AM 07/10/2021    9:43 AM 11/17/2020    8:34 AM  PHQ 2/9 Scores  PHQ - 2 Score 0  0 0 0 0 0 0  PHQ- 9 Score 0   0 0      Fall Risk     07/18/2023    1:24 PM 11/05/2022    1:00 PM 07/18/2022    8:51 AM 12/22/2021    8:59 AM 07/10/2021    9:29 AM  Fall Risk   Falls in the past year? 0 0 0 0 0  Number falls in past yr: 0 0 0 0 0  Injury with Fall? 0 0 0 0 0  Risk for fall due to :  No Fall Risks No Fall Risks No Fall Risks No Fall Risks  Follow up Falls evaluation  completed;Falls prevention discussed Falls evaluation completed Falls prevention discussed Falls evaluation completed Falls evaluation completed    MEDICARE RISK AT HOME:  Medicare Risk at Home Any stairs in or around the home?: (Patient-Rptd) Yes If so, are there any without handrails?: (Patient-Rptd) No Home free of loose throw rugs in walkways, pet beds, electrical cords, etc?: (Patient-Rptd) Yes Adequate lighting in your home to reduce risk of falls?: (Patient-Rptd) Yes Life alert?: (Patient-Rptd) No Use of a cane, walker or w/c?: (Patient-Rptd) No Grab bars in the bathroom?: (Patient-Rptd) No Shower chair or bench in shower?: (Patient-Rptd) No Elevated toilet seat or a handicapped toilet?: (Patient-Rptd) No  TIMED UP AND GO:  Was the test performed?  No  Cognitive Function: 6CIT completed        07/22/2023    8:54 AM 07/18/2022    8:54 AM  6CIT Screen  What Year? 0 points 0 points  What month? 0 points 0 points  What time? 0 points 0 points  Count back from 20 0 points 0 points  Months in reverse 0 points 0 points  Repeat phrase 0 points 0 points  Total Score 0 points 0 points    Immunizations Immunization History  Administered Date(s) Administered   Fluad Quad(high Dose 65+) 02/20/2019, 04/20/2020, 04/08/2022   Fluad Trivalent(High Dose 65+) 02/21/2023   Influenza Split 05/21/2012   Influenza Whole 05/19/2010   Influenza, High Dose Seasonal PF 05/23/2018   Influenza,inj,Quad PF,6+ Mos 05/27/2014   PFIZER Comirnaty(Gray Top)Covid-19 Tri-Sucrose Vaccine 02/09/2022   PFIZER(Purple Top)SARS-COV-2 Vaccination 06/02/2019, 06/23/2019, 03/01/2020, 09/02/2020   Pfizer Covid-19 Vaccine Bivalent Booster 68yrs & up 02/13/2021   Pneumococcal Conjugate-13 11/06/2018   Pneumococcal Polysaccharide-23 11/17/2020   Tdap 01/04/2011    Screening Tests Health Maintenance  Topic Date Due   Zoster Vaccines- Shingrix (1 of 2) Never done   DTaP/Tdap/Td (2 - Td or Tdap) 01/03/2021    COVID-19 Vaccine (7 - 2024-25 season) 01/13/2023   Medicare Annual Wellness (AWV)  07/21/2024   Colonoscopy  01/23/2027   Pneumonia Vaccine 76+ Years old  Completed   INFLUENZA VACCINE  Completed   Hepatitis C Screening  Completed   HPV VACCINES  Aged Out    Health Maintenance  Health Maintenance Due  Topic Date Due   Zoster Vaccines- Shingrix (1 of 2) Never done   DTaP/Tdap/Td (2 - Td or Tdap) 01/03/2021   COVID-19 Vaccine (7 - 2024-25 season) 01/13/2023   Health Maintenance Items Addressed: See Nurse Notes  Additional Screening:  Vision Screening: Recommended annual ophthalmology exams for early detection of glaucoma and other disorders of the eye.  Dental Screening: Recommended annual dental exams for proper oral hygiene  Community Resource Referral / Chronic Care Management: CRR required this visit?  No   CCM required this visit?  No     Plan:  I have personally reviewed and noted the following in the patient's chart:   Medical and social history Use of alcohol, tobacco or illicit drugs  Current medications and supplements including opioid prescriptions. Patient is not currently taking opioid prescriptions. Functional ability and status Nutritional status Physical activity Advanced directives List of other physicians Hospitalizations, surgeries, and ER visits in previous 12 months Vitals Screenings to include cognitive, depression, and falls Referrals and appointments  In addition, I have reviewed and discussed with patient certain preventive protocols, quality metrics, and best practice recommendations. A written personalized care plan for preventive services as well as general preventive health recommendations were provided to patient.     Jordanne Elsbury L Charish Schroepfer, CMA   07/22/2023   After Visit Summary: (MyChart) Due to this being a telephonic visit, the after visit summary with patients personalized plan was offered to patient via MyChart   Notes: Please  refer to Routing Comments.

## 2023-07-31 ENCOUNTER — Encounter (HOSPITAL_COMMUNITY): Payer: Self-pay | Admitting: Urology

## 2023-07-31 NOTE — Progress Notes (Addendum)
 Spoke w/ via phone for pre-op interview--- Nicholas Paul Lab needs dos---- BMP per anesthesia. Surgeon orders pending.        Lab results------Current EKG in Epic dated 03/04/23. COVID test -----patient states asymptomatic no test needed Arrive at -------1130 NPO after MN NO Solid Food.  Clear liquids from MN until---1030 Pre-Surgery Ensure or G2:  Med rec completed Medications to take morning of surgery ----- Norvasc, Nubeqa, Pepcid, Orgovyx. Diabetic medication -----  GLP1 agonist last dose: GLP1 instructions:  Patient instructed no nail polish to be worn day of surgery Patient instructed to bring photo id and insurance card day of surgery Patient aware to have Driver (ride ) / caregiver    for 24 hours after surgery - Nicholas Paul-wife Patient Special Instructions -----Fleet enema per surgeon orders, pt verbalized understanding. Per Dr Liliane Shi pt to hold ASA 5-7 days prior to procedure. Pre-Op special Instructions -----  Patient verbalized understanding of instructions that were given at this phone interview. Patient denies chest pain, sob, fever, cough at the interview.

## 2023-08-09 ENCOUNTER — Telehealth: Payer: Self-pay | Admitting: *Deleted

## 2023-08-09 ENCOUNTER — Ambulatory Visit (HOSPITAL_COMMUNITY): Admitting: Anesthesiology

## 2023-08-09 ENCOUNTER — Encounter (HOSPITAL_COMMUNITY): Payer: Self-pay | Admitting: Urology

## 2023-08-09 ENCOUNTER — Other Ambulatory Visit: Payer: Self-pay

## 2023-08-09 ENCOUNTER — Other Ambulatory Visit: Payer: Self-pay | Admitting: Urology

## 2023-08-09 ENCOUNTER — Encounter (HOSPITAL_COMMUNITY)
Admission: RE | Disposition: A | Discharge status: Home / Self Care | Payer: Self-pay | Source: Home / Self Care | Attending: Urology

## 2023-08-09 ENCOUNTER — Ambulatory Visit (HOSPITAL_COMMUNITY)
Admission: RE | Admit: 2023-08-09 | Discharge: 2023-08-09 | Disposition: A | Discharge status: Home / Self Care | Payer: Medicare Other | Attending: Urology | Admitting: Urology

## 2023-08-09 DIAGNOSIS — K219 Gastro-esophageal reflux disease without esophagitis: Secondary | ICD-10-CM | POA: Diagnosis not present

## 2023-08-09 DIAGNOSIS — C61 Malignant neoplasm of prostate: Secondary | ICD-10-CM | POA: Diagnosis not present

## 2023-08-09 DIAGNOSIS — I10 Essential (primary) hypertension: Secondary | ICD-10-CM | POA: Diagnosis not present

## 2023-08-09 DIAGNOSIS — Z79899 Other long term (current) drug therapy: Secondary | ICD-10-CM | POA: Diagnosis not present

## 2023-08-09 DIAGNOSIS — I2510 Atherosclerotic heart disease of native coronary artery without angina pectoris: Secondary | ICD-10-CM | POA: Insufficient documentation

## 2023-08-09 HISTORY — DX: Malignant (primary) neoplasm, unspecified: C80.1

## 2023-08-09 HISTORY — PX: SPACE OAR INSTILLATION: SHX6769

## 2023-08-09 HISTORY — PX: GOLD SEED IMPLANT: SHX6343

## 2023-08-09 LAB — BASIC METABOLIC PANEL WITH GFR
Anion gap: 10 (ref 5–15)
BUN: 14 mg/dL (ref 8–23)
CO2: 25 mmol/L (ref 22–32)
Calcium: 9.7 mg/dL (ref 8.9–10.3)
Chloride: 104 mmol/L (ref 98–111)
Creatinine, Ser: 0.88 mg/dL (ref 0.61–1.24)
GFR, Estimated: >60 mL/min (ref >60)
Glucose, Bld: 111 mg/dL — ABNORMAL HIGH (ref 70–99)
Potassium: 3.8 mmol/L (ref 3.5–5.1)
Sodium: 139 mmol/L (ref 135–145)

## 2023-08-09 SURGERY — INSERTION, GOLD SEEDS
Anesthesia: Monitor Anesthesia Care | Site: Prostate

## 2023-08-09 MED ORDER — PROPOFOL 10 MG/ML IV BOLUS
INTRAVENOUS | Status: DC | PRN
  Administered 2023-08-09: 20 mg via INTRAVENOUS
  Administered 2023-08-09: 70 ug/kg/min via INTRAVENOUS
  Administered 2023-08-09 (×2): 30 mg via INTRAVENOUS

## 2023-08-09 MED ORDER — ONDANSETRON HCL 4 MG/2ML IJ SOLN
INTRAMUSCULAR | Status: AC
  Filled 2023-08-09: qty 2

## 2023-08-09 MED ORDER — FENTANYL CITRATE (PF) 100 MCG/2ML IJ SOLN: 25.0000 ug | INTRAMUSCULAR | Status: DC | PRN

## 2023-08-09 MED ORDER — ONDANSETRON HCL 4 MG/2ML IJ SOLN
INTRAMUSCULAR | Status: DC | PRN
  Administered 2023-08-09: 4 mg via INTRAVENOUS

## 2023-08-09 MED ORDER — SODIUM CHLORIDE (PF) 0.9 % IJ SOLN
INTRAMUSCULAR | Status: DC | PRN
  Administered 2023-08-09: 10 mL

## 2023-08-09 MED ORDER — LACTATED RINGERS IV SOLN: INTRAVENOUS | Status: DC

## 2023-08-09 MED ORDER — BUPIVACAINE HCL (PF) 0.25 % IJ SOLN
INTRAMUSCULAR | Status: AC
  Filled 2023-08-09: qty 10

## 2023-08-09 MED ORDER — MIDAZOLAM HCL 2 MG/2ML IJ SOLN
INTRAMUSCULAR | Status: AC
  Filled 2023-08-09: qty 2

## 2023-08-09 MED ORDER — KETOROLAC TROMETHAMINE 15 MG/ML IJ SOLN: 15.0000 mg | Freq: Once | INTRAMUSCULAR | Status: DC | PRN

## 2023-08-09 MED ORDER — LIDOCAINE 2% (20 MG/ML) 5 ML SYRINGE
INTRAMUSCULAR | Status: AC
  Filled 2023-08-09: qty 5

## 2023-08-09 MED ORDER — MIDAZOLAM HCL 2 MG/2ML IJ SOLN
INTRAMUSCULAR | Status: DC | PRN
  Administered 2023-08-09: 1 mg via INTRAVENOUS

## 2023-08-09 MED ORDER — CHLORHEXIDINE GLUCONATE 0.12 % MT SOLN
15.0000 mL | Freq: Once | OROMUCOSAL | Status: AC
  Administered 2023-08-09: 15 mL via OROMUCOSAL
  Filled 2023-08-09: qty 15

## 2023-08-09 MED ORDER — CEFAZOLIN SODIUM-DEXTROSE 2-4 GM/100ML-% IV SOLN
2.0000 g | INTRAVENOUS | Status: AC
  Administered 2023-08-09: 2 g via INTRAVENOUS
  Filled 2023-08-09: qty 100

## 2023-08-09 MED ORDER — FLEET ENEMA RE ENEM: 1.0000 | ENEMA | Freq: Once | RECTAL | Status: DC

## 2023-08-09 MED ORDER — FENTANYL CITRATE (PF) 250 MCG/5ML IJ SOLN
INTRAMUSCULAR | Status: AC
  Filled 2023-08-09: qty 5

## 2023-08-09 MED ORDER — DEXAMETHASONE SODIUM PHOSPHATE 10 MG/ML IJ SOLN
INTRAMUSCULAR | Status: AC
Start: 2023-08-09 — End: ?
  Filled 2023-08-09: qty 1

## 2023-08-09 MED ORDER — DEXAMETHASONE SODIUM PHOSPHATE 10 MG/ML IJ SOLN
INTRAMUSCULAR | Status: DC | PRN
  Administered 2023-08-09: 10 mg via INTRAVENOUS

## 2023-08-09 MED ORDER — ACETAMINOPHEN 10 MG/ML IV SOLN: 1000.0000 mg | Freq: Once | INTRAVENOUS | Status: DC | PRN

## 2023-08-09 MED ORDER — FENTANYL CITRATE (PF) 250 MCG/5ML IJ SOLN
INTRAMUSCULAR | Status: DC | PRN
  Administered 2023-08-09: 25 ug via INTRAVENOUS
  Administered 2023-08-09: 50 ug via INTRAVENOUS

## 2023-08-09 MED ORDER — ORAL CARE MOUTH RINSE: 15.0000 mL | Freq: Once | OROMUCOSAL | Status: AC

## 2023-08-09 MED ORDER — LIDOCAINE 2% (20 MG/ML) 5 ML SYRINGE
INTRAMUSCULAR | Status: DC | PRN
  Administered 2023-08-09: 60 mg via INTRAVENOUS

## 2023-08-09 MED ORDER — AMISULPRIDE (ANTIEMETIC) 5 MG/2ML IV SOLN: 10.0000 mg | Freq: Once | INTRAVENOUS | Status: DC | PRN

## 2023-08-09 MED ORDER — SODIUM CHLORIDE (PF) 0.9 % IJ SOLN
INTRAMUSCULAR | Status: AC
  Filled 2023-08-09: qty 10

## 2023-08-09 MED ORDER — ONDANSETRON HCL 4 MG/2ML IJ SOLN: 4.0000 mg | Freq: Once | INTRAMUSCULAR | Status: DC | PRN

## 2023-08-09 MED ORDER — BUPIVACAINE HCL (PF) 0.25 % IJ SOLN
INTRAMUSCULAR | Status: DC | PRN
  Administered 2023-08-09: 10 mL

## 2023-08-09 SURGICAL SUPPLY — 23 items
BLADE CLIPPER SENSICLIP SURGIC (BLADE) ×2 IMPLANT
CNTNR URN SCR LID CUP LEK RST (MISCELLANEOUS) ×2 IMPLANT
COVER BACK TABLE 60X90IN (DRAPES) ×2 IMPLANT
DRSG TEGADERM 4X4.75 (GAUZE/BANDAGES/DRESSINGS) ×2 IMPLANT
DRSG TEGADERM 8X12 (GAUZE/BANDAGES/DRESSINGS) ×2 IMPLANT
GAUZE SPONGE 4X4 12PLY STRL (GAUZE/BANDAGES/DRESSINGS) ×2 IMPLANT
GLOVE BIO SURGEON STRL SZ7.5 (GLOVE) ×2 IMPLANT
GLOVE SURG ORTHO 8.5 STRL (GLOVE) ×2 IMPLANT
IMPL SPACEOAR VUE SYSTEM (Spacer) ×2 IMPLANT
IMPLANT SPACEOAR VUE SYSTEM (Spacer) ×1 IMPLANT
MARKER GOLD PRELOAD 1.2X3 (Urological Implant) ×2 IMPLANT
MARKER SKIN DUAL TIP RULER LAB (MISCELLANEOUS) ×2 IMPLANT
NDL SPNL 22GX3.5 QUINCKE BK (NEEDLE) ×2 IMPLANT
NEEDLE SPNL 22GX3.5 QUINCKE BK (NEEDLE) ×1 IMPLANT
SEED GOLD PRELOAD 1.2X3 (Urological Implant) ×1 IMPLANT
SHEATH ULTRASOUND LF (SHEATH) ×2 IMPLANT
SHEATH ULTRASOUND LTX NONSTRL (SHEATH) IMPLANT
SLEEVE SCD COMPRESS KNEE MED (STOCKING) ×2 IMPLANT
SURGILUBE 2OZ TUBE FLIPTOP (MISCELLANEOUS) ×2 IMPLANT
SYR 10ML LL (SYRINGE) IMPLANT
SYR CONTROL 10ML LL (SYRINGE) ×2 IMPLANT
TOWEL OR 17X24 6PK STRL BLUE (TOWEL DISPOSABLE) ×2 IMPLANT
UNDERPAD 30X36 HEAVY ABSORB (UNDERPADS AND DIAPERS) ×2 IMPLANT

## 2023-08-09 NOTE — Discharge Instructions (Signed)

## 2023-08-09 NOTE — Anesthesia Preprocedure Evaluation (Addendum)
 Anesthesia Evaluation  Patient identified by MRN, date of birth, ID band Patient awake    Reviewed: Allergy & Precautions, NPO status , Patient's Chart, lab work & pertinent test results  Airway Mallampati: II       Dental no notable dental hx.    Pulmonary neg pulmonary ROS   Pulmonary exam normal        Cardiovascular hypertension, Pt. on medications + CAD  Normal cardiovascular exam     Neuro/Psych negative neurological ROS  negative psych ROS   GI/Hepatic Neg liver ROS,GERD  Controlled,,  Endo/Other  negative endocrine ROS    Renal/GU negative Renal ROS     Musculoskeletal negative musculoskeletal ROS (+)    Abdominal   Peds  Hematology negative hematology ROS (+)   Anesthesia Other Findings PROSTATE CANCER  Reproductive/Obstetrics                             Anesthesia Physical Anesthesia Plan  ASA: 2  Anesthesia Plan: MAC   Post-op Pain Management:    Induction: Intravenous  PONV Risk Score and Plan: 2 and Ondansetron, Dexamethasone, Propofol infusion and Treatment may vary due to age or medical condition  Airway Management Planned: Simple Face Mask  Additional Equipment:   Intra-op Plan:   Post-operative Plan:   Informed Consent: I have reviewed the patients History and Physical, chart, labs and discussed the procedure including the risks, benefits and alternatives for the proposed anesthesia with the patient or authorized representative who has indicated his/her understanding and acceptance.     Dental advisory given  Plan Discussed with: CRNA  Anesthesia Plan Comments:        Anesthesia Quick Evaluation

## 2023-08-09 NOTE — H&P (Signed)
 Urology Preoperative H&P   Chief Complaint: prostate cancer   History of Present Illness: Nicholas Paul is a 73 y.o. male with followed by Dr. Mena Paul with grade 5 prostate cancer.  He has elected to proceed with external beam radiation therapy as primary treatment of his prostate cancer.  He is here today for gold seed fiducial marker and SpaceOAR placement.  Leading up to surgery, the patient reports that he is voiding without difficulty and denies interval UTIs, dysuria or hematuria.   Past Medical History:  Diagnosis Date   BPH (benign prostatic hyperplasia)    urologist--- dr Nicholas Paul   Cancer [facility])    Coronary artery disease cardiologist--- Nicholas Fiscal gerhardt NP   last CTA 02-17-2018  nonobstructive proximal and mid LAD, calcium score 15;   nuclear stress test 06-09-2003 in epic,  no ischemia w/ ef 59%   Family history of bladder cancer    Family history of prostate cancer    GERD (gastroesophageal reflux disease)    H/O: rheumatic fever    noted after tonsillectomy   Head injury, closed, initial encounter 03/26/2019   Hyperlipidemia    Hypertension    followed by cardiology   IBS (irritable bowel syndrome)    Dr Nicholas Paul   Internal hemorrhoids    Pulmonary nodule    4 mm RML nodule on CT scan in 12/12. Needs follow up in one year.       Past Surgical History:  Procedure Laterality Date   COLONOSCOPY W/ POLYPECTOMY  2006   internal hemorrhoids 2011   IR IMAGING GUIDED PORT INSERTION  03/05/2023   IR REMOVAL TUN ACCESS W/ PORT W/O FL MOD SED  07/15/2023   PROSTATE BIOPSY     SHOULDER ARTHROSCOPY WITH OPEN ROTATOR CUFF REPAIR Right 2022   THULIUM LASER TURP (TRANSURETHRAL RESECTION OF PROSTATE) N/A 07/14/2019   Procedure: THULIUM LASER TURP (TRANSURETHRAL RESECTION OF PROSTATE);  Surgeon: Nicholas Field, MD;  Location: [facility];  Service: Urology;  Laterality: N/A;   TONSILLECTOMY AND ADENOIDECTOMY      Allergies:  Allergies  Allergen Reactions    Doxazosin Mesylate Swelling    REACTION: edema on the generic (07-09-2019  Per pt this reaction was approx. 1990s).    Crestor [Rosuvastatin]     Crestor 5 mg qd caused leg myalgias ; no issue with Lipitor 10 mg M, W, & F    Family History  Problem Relation Age of Onset   Hyperlipidemia Mother    Hypertension Mother    Bladder Cancer Father 22       heavy smoker   Bladder Cancer Brother        dx. 30s, non-smoker   Hypertension Brother    Brain cancer Maternal Uncle 37       glioblastoma   Bladder Cancer Paternal Aunt    Prostate cancer Paternal Grandfather        d. 11s   Prostate cancer Cousin        mat first cousin   Stroke Neg Hx    Diabetes Neg Hx    Heart attack Neg Hx    Colon cancer Neg Hx    Colon polyps Neg Hx    Esophageal cancer Neg Hx    Rectal cancer Neg Hx    Stomach cancer Neg Hx     Social History:  reports that he has never smoked. He has never been exposed to tobacco smoke. He has never used smokeless tobacco. He reports current alcohol use.  He reports that he does not use drugs.  ROS: A complete review of systems was performed.  All systems are negative except for pertinent findings as noted.  Physical Exam:  Vital signs in last 24 hours:   Constitutional:  Alert and oriented, No acute distress Cardiovascular: Regular rate and rhythm, No JVD Respiratory: Normal respiratory effort, Lungs clear bilaterally GI: Abdomen is soft, nontender, nondistended, no abdominal masses GU: No CVA tenderness Lymphatic: No lymphadenopathy Neurologic: Grossly intact, no focal deficits Psychiatric: Normal mood and affect  Laboratory Data:  No results for input(s): "WBC", "HGB", "HCT", "PLT" in the last 72 hours.  No results for input(s): "NA", "K", "CL", "GLUCOSE", "BUN", "CALCIUM", "CREATININE" in the last 72 hours.  Invalid input(s): "CO3"   No results found for this or any previous visit (from the past 24 hours). No results found for this or any previous  visit (from the past 240 hours).  Renal Function: No results for input(s): "CREATININE" in the last 168 hours. CrCl cannot be calculated (Patient's most recent lab result is older than the maximum 21 days allowed.).  Radiologic Imaging: No results found.  I independently reviewed the above imaging studies.  Assessment and Plan Nicholas Paul is a 73 y.o. male with grade 5 prostate cancer   The risk, benefits and alternatives of gold seed fiducial marker and SpaceOAR placement was discussed with the patient.  Risk include, but are not limited to, bleeding, urinary tract infection, perineal infection, urethral injury, rectal irritation/ulceration, MI, CVA, DVT and the inherent risk of general anesthesia.  He voices understanding and wishes to proceed.   Nicholas Moody, MD 08/09/2023, 11:19 AM  [facility] Pager: [phone]

## 2023-08-09 NOTE — Anesthesia Postprocedure Evaluation (Signed)
 Anesthesia Post Note  Patient: Nicholas Paul  Procedure(s) Performed: INSERTION, GOLD SEEDS (Prostate) INJECTION, HYDROGEL SPACER (Prostate)     Patient location during evaluation: PACU Anesthesia Type: MAC Level of consciousness: awake Pain management: pain level controlled Vital Signs Assessment: post-procedure vital signs reviewed and stable Respiratory status: spontaneous breathing, nonlabored ventilation and respiratory function stable Cardiovascular status: blood pressure returned to baseline and stable Postop Assessment: no apparent nausea or vomiting Anesthetic complications: no   No notable events documented.  Last Vitals:  Vitals:   08/09/23 1152 08/09/23 1453  BP: (!) 140/73 (!) 127/96  Pulse: 83 90  Resp: 15 12  Temp: 36.9 C 36.5 C  SpO2: 97% 97%    Last Pain:  Vitals:   08/09/23 1453  TempSrc:   PainSc: 0-No pain                 Pate Aylward P Wynter Grave

## 2023-08-09 NOTE — Telephone Encounter (Signed)
 Called patient to remind of sim appt. for 08-12-23- arrival time- 7:45 am @ [facility], informed patient to arrive with a full bladder, spoke with patient and he is aware of this appt. and the instructions

## 2023-08-09 NOTE — Op Note (Signed)
 Operative Note   Preoperative diagnosis:  1.  Grade 5 prostate cancer   Postoperative diagnosis: 1.  Same    Procedure(s): 1.  Transrectal ultrasound guided prostatic gold seed fiducial marker and Space OAR placement   Surgeon: Rhoderick Moody, MD   Assistants:  None    Anesthesia:  MAC   Complications:  None   EBL:  <5 mL   Specimens: 1. None   Drains/Catheters: 1.  None   Intraoperative findings:   Fiducial markers were placed at the prostatic base, mid-gland and apex.  SpaceOAR was placed with good separation of the prostate and rectum.    Indication: [name] is a 73 y.o. male with grade 5 prostate cancer.  He has elected to proceed with XRT as primary treatment and is here today for gold seed fiducial marker and SpaceOAR placement.  He has been consented for the above procedures, voices understanding and wishes to proceed.    Description of procedure:   After informed consent the patient was brought to the major OR, placed on the table and administered general anesthesia. He was then moved to the modified lithotomy position with his perineum perpendicular to the floor. His perineum and genitalia were then sterilely prepped. An official timeout was then performed.    Real time transrectal ultrasonography was used visualize the prostate.  Gold seed fiducial markers were then placed transperineally at the prostatic base, mid gland and apex.   I then proceeded with placement of SpaceOAR by introducing a needle with the bevel angled inferiorly approximately 2 cm superior to the anus. This was angled downward and under direct ultrasound was placed within the space between the prostatic capsule and rectum. This was confirmed with a small amount of sterile saline injected and this was performed under direct ultrasound. I then attached the SpaceOAR to the needle and injected this in the space between the prostate and rectum with good placement noted.  The patient tolerated the  procedure well and was transferred to the postanesthesia in stable condition.   Plan: Follow-up in 4 months with PSA

## 2023-08-09 NOTE — Transfer of Care (Signed)
 Immediate Anesthesia Transfer of Care Note  Patient: Nicholas Paul  Procedure(s) Performed: INSERTION, GOLD SEEDS (Prostate) INJECTION, HYDROGEL SPACER (Prostate)  Patient Location: PACU  Anesthesia Type:MAC  Level of Consciousness: awake and alert   Airway & Oxygen Therapy: Patient Spontanous Breathing and Patient connected to face mask oxygen  Post-op Assessment: Report given to RN and Post -op Vital signs reviewed and stable  Post vital signs: Reviewed and stable  Last Vitals:  Vitals Value Taken Time  BP 127/96 08/09/23 1453  Temp    Pulse 86 08/09/23 1454  Resp 14 08/09/23 1454  SpO2 92 % 08/09/23 1454  Vitals shown include unfiled device data.  Last Pain:  Vitals:   08/09/23 1152  TempSrc: Oral  PainSc: 0-No pain         Complications: No notable events documented.

## 2023-08-12 ENCOUNTER — Ambulatory Visit
Admission: RE | Admit: 2023-08-12 | Discharge: 2023-08-12 | Disposition: A | Discharge status: Home / Self Care | Payer: Medicare Other | Source: Ambulatory Visit | Attending: Radiation Oncology | Admitting: Radiation Oncology

## 2023-08-12 ENCOUNTER — Encounter (HOSPITAL_COMMUNITY): Payer: Self-pay | Admitting: Urology

## 2023-08-12 DIAGNOSIS — C778 Secondary and unspecified malignant neoplasm of lymph nodes of multiple regions: Secondary | ICD-10-CM

## 2023-08-12 DIAGNOSIS — C7951 Secondary malignant neoplasm of bone: Secondary | ICD-10-CM | POA: Diagnosis not present

## 2023-08-12 DIAGNOSIS — C61 Malignant neoplasm of prostate: Secondary | ICD-10-CM | POA: Diagnosis not present

## 2023-08-12 DIAGNOSIS — Z191 Hormone sensitive malignancy status: Secondary | ICD-10-CM | POA: Diagnosis not present

## 2023-08-13 NOTE — Addendum Note (Signed)
 Encounter addended by: Margaretmary Dys, MD on: 08/13/2023 3:32 PM  Actions taken: Clinical Note Signed

## 2023-08-13 NOTE — Progress Notes (Addendum)
  Radiation Oncology         [phone] ________________________________  Name: Nicholas Paul MRN: [id number]  Date: 08/12/2023  DOB: 1951-05-10  SIMULATION AND TREATMENT PLANNING NOTE    ICD-10-CM   1. Prostate cancer metastatic to bone (HCC)  C61    C79.51     2. Malignant neoplasm of prostate metastatic to lymph nodes of multiple sites [facility])  C61    C77.8       DIAGNOSIS:  73 y/o man with oligometastatic, Gleason 4+5, adenocarcinoma of the prostate involving  abdominopelvic lymph nodes and 3 bony lesions with a pretreatment PSA of 13.7.  NARRATIVE:  The patient was brought to the CT Simulation planning suite.  Identity was confirmed.  All relevant records and images related to the planned course of therapy were reviewed.  The patient freely provided informed written consent to proceed with treatment after reviewing the details related to the planned course of therapy. The consent form was witnessed and verified by the simulation staff.  Then, the patient was set-up in a stable reproducible supine position for radiation therapy.  A vacuum lock pillow device was custom fabricated to position his legs in a reproducible immobilized position.  Then, I performed a urethrogram under sterile conditions to identify the prostatic bed.  CT images were obtained.  Surface markings were placed.  The CT images were loaded into the planning software.  Then the prostate bed target, pelvic lymph node target and avoidance structures including the rectum, bladder, bowel and hips were contoured.  Treatment planning then occurred.  The radiation prescription was entered and confirmed.  A total of one complex treatment devices were fabricated. I have requested : Intensity Modulated Radiotherapy (IMRT) is medically necessary for this case for the following reason:  Rectal sparing.Marland Kitchen  PLAN:  The patient will receive 45 Gy in 25 fractions of 1.8 Gy, followed by a boost to the prostate to a total dose of 75 Gy with  15 additional fractions of 2 Gy.  The patient will receive 40 Gy in 5 fractions to the sternum using SBRT with a CT sim later..   ________________________________  Artist Pais Kathrynn Running, M.D.

## 2023-08-16 DIAGNOSIS — C61 Malignant neoplasm of prostate: Secondary | ICD-10-CM | POA: Diagnosis not present

## 2023-08-16 DIAGNOSIS — Z191 Hormone sensitive malignancy status: Secondary | ICD-10-CM | POA: Diagnosis not present

## 2023-08-16 DIAGNOSIS — C7951 Secondary malignant neoplasm of bone: Secondary | ICD-10-CM | POA: Insufficient documentation

## 2023-08-21 ENCOUNTER — Ambulatory Visit
Admission: RE | Admit: 2023-08-21 | Discharge: 2023-08-21 | Disposition: A | Discharge status: Home / Self Care | Source: Ambulatory Visit | Attending: Radiation Oncology | Admitting: Radiation Oncology

## 2023-08-21 ENCOUNTER — Other Ambulatory Visit: Payer: Self-pay

## 2023-08-21 DIAGNOSIS — Z510 Encounter for antineoplastic radiation therapy: Secondary | ICD-10-CM | POA: Diagnosis not present

## 2023-08-21 DIAGNOSIS — C7951 Secondary malignant neoplasm of bone: Secondary | ICD-10-CM | POA: Diagnosis not present

## 2023-08-21 DIAGNOSIS — C61 Malignant neoplasm of prostate: Secondary | ICD-10-CM | POA: Diagnosis not present

## 2023-08-21 DIAGNOSIS — C778 Secondary and unspecified malignant neoplasm of lymph nodes of multiple regions: Secondary | ICD-10-CM | POA: Diagnosis not present

## 2023-08-21 DIAGNOSIS — Z191 Hormone sensitive malignancy status: Secondary | ICD-10-CM | POA: Diagnosis not present

## 2023-08-21 LAB — RAD ONC ARIA SESSION SUMMARY
Course Elapsed Days: 0
Plan Fractions Treated to Date: 1
Plan Prescribed Dose Per Fraction: 1.8 Gy
Plan Total Fractions Prescribed: 25
Plan Total Prescribed Dose: 45 Gy
Reference Point Dosage Given to Date: 1.8 Gy
Reference Point Session Dosage Given: 1.8 Gy
Session Number: 1

## 2023-08-22 ENCOUNTER — Ambulatory Visit
Admission: RE | Admit: 2023-08-22 | Discharge: 2023-08-22 | Discharge status: Home / Self Care | Source: Ambulatory Visit | Attending: Radiation Oncology

## 2023-08-22 ENCOUNTER — Other Ambulatory Visit: Payer: Self-pay

## 2023-08-22 DIAGNOSIS — C7951 Secondary malignant neoplasm of bone: Secondary | ICD-10-CM | POA: Diagnosis not present

## 2023-08-22 DIAGNOSIS — Z191 Hormone sensitive malignancy status: Secondary | ICD-10-CM | POA: Diagnosis not present

## 2023-08-22 DIAGNOSIS — Z510 Encounter for antineoplastic radiation therapy: Secondary | ICD-10-CM | POA: Diagnosis not present

## 2023-08-22 DIAGNOSIS — C61 Malignant neoplasm of prostate: Secondary | ICD-10-CM | POA: Diagnosis not present

## 2023-08-22 LAB — RAD ONC ARIA SESSION SUMMARY
Course Elapsed Days: 1
Plan Fractions Treated to Date: 2
Plan Prescribed Dose Per Fraction: 1.8 Gy
Plan Total Fractions Prescribed: 25
Plan Total Prescribed Dose: 45 Gy
Reference Point Dosage Given to Date: 3.6 Gy
Reference Point Session Dosage Given: 1.8 Gy
Session Number: 2

## 2023-08-23 ENCOUNTER — Other Ambulatory Visit: Payer: Self-pay

## 2023-08-23 ENCOUNTER — Ambulatory Visit
Admission: RE | Admit: 2023-08-23 | Discharge: 2023-08-23 | Disposition: A | Discharge status: Home / Self Care | Source: Ambulatory Visit | Attending: Radiation Oncology | Admitting: Radiation Oncology

## 2023-08-23 DIAGNOSIS — C61 Malignant neoplasm of prostate: Secondary | ICD-10-CM | POA: Diagnosis not present

## 2023-08-23 DIAGNOSIS — Z510 Encounter for antineoplastic radiation therapy: Secondary | ICD-10-CM | POA: Diagnosis not present

## 2023-08-23 DIAGNOSIS — Z191 Hormone sensitive malignancy status: Secondary | ICD-10-CM | POA: Diagnosis not present

## 2023-08-23 DIAGNOSIS — C7951 Secondary malignant neoplasm of bone: Secondary | ICD-10-CM | POA: Diagnosis not present

## 2023-08-23 LAB — RAD ONC ARIA SESSION SUMMARY
Course Elapsed Days: 2
Plan Fractions Treated to Date: 3
Plan Prescribed Dose Per Fraction: 1.8 Gy
Plan Total Fractions Prescribed: 25
Plan Total Prescribed Dose: 45 Gy
Reference Point Dosage Given to Date: 5.4 Gy
Reference Point Session Dosage Given: 1.8 Gy
Session Number: 3

## 2023-08-26 ENCOUNTER — Other Ambulatory Visit: Payer: Self-pay

## 2023-08-26 ENCOUNTER — Ambulatory Visit
Admission: RE | Admit: 2023-08-26 | Discharge: 2023-08-26 | Disposition: A | Discharge status: Home / Self Care | Source: Ambulatory Visit | Attending: Radiation Oncology | Admitting: Radiation Oncology

## 2023-08-26 DIAGNOSIS — Z510 Encounter for antineoplastic radiation therapy: Secondary | ICD-10-CM | POA: Diagnosis not present

## 2023-08-26 DIAGNOSIS — C7951 Secondary malignant neoplasm of bone: Secondary | ICD-10-CM | POA: Diagnosis not present

## 2023-08-26 DIAGNOSIS — C61 Malignant neoplasm of prostate: Secondary | ICD-10-CM | POA: Diagnosis not present

## 2023-08-26 DIAGNOSIS — Z191 Hormone sensitive malignancy status: Secondary | ICD-10-CM | POA: Diagnosis not present

## 2023-08-26 LAB — RAD ONC ARIA SESSION SUMMARY
Course Elapsed Days: 5
Plan Fractions Treated to Date: 4
Plan Prescribed Dose Per Fraction: 1.8 Gy
Plan Total Fractions Prescribed: 25
Plan Total Prescribed Dose: 45 Gy
Reference Point Dosage Given to Date: 7.2 Gy
Reference Point Session Dosage Given: 1.8 Gy
Session Number: 4

## 2023-08-27 ENCOUNTER — Ambulatory Visit
Admission: RE | Admit: 2023-08-27 | Discharge: 2023-08-27 | Disposition: A | Discharge status: Home / Self Care | Source: Ambulatory Visit | Attending: Radiation Oncology | Admitting: Radiation Oncology

## 2023-08-27 ENCOUNTER — Other Ambulatory Visit: Payer: Self-pay

## 2023-08-27 DIAGNOSIS — C7951 Secondary malignant neoplasm of bone: Secondary | ICD-10-CM | POA: Diagnosis not present

## 2023-08-27 DIAGNOSIS — Z510 Encounter for antineoplastic radiation therapy: Secondary | ICD-10-CM | POA: Diagnosis not present

## 2023-08-27 DIAGNOSIS — C61 Malignant neoplasm of prostate: Secondary | ICD-10-CM | POA: Diagnosis not present

## 2023-08-27 DIAGNOSIS — Z191 Hormone sensitive malignancy status: Secondary | ICD-10-CM | POA: Diagnosis not present

## 2023-08-27 LAB — RAD ONC ARIA SESSION SUMMARY
Course Elapsed Days: 6
Plan Fractions Treated to Date: 5
Plan Prescribed Dose Per Fraction: 1.8 Gy
Plan Total Fractions Prescribed: 25
Plan Total Prescribed Dose: 45 Gy
Reference Point Dosage Given to Date: 9 Gy
Reference Point Session Dosage Given: 1.8 Gy
Session Number: 5

## 2023-08-28 ENCOUNTER — Ambulatory Visit
Admission: RE | Admit: 2023-08-28 | Discharge: 2023-08-28 | Disposition: A | Discharge status: Home / Self Care | Source: Ambulatory Visit | Attending: Radiation Oncology | Admitting: Radiation Oncology

## 2023-08-28 ENCOUNTER — Other Ambulatory Visit: Payer: Self-pay

## 2023-08-28 DIAGNOSIS — C7951 Secondary malignant neoplasm of bone: Secondary | ICD-10-CM | POA: Diagnosis not present

## 2023-08-28 DIAGNOSIS — Z191 Hormone sensitive malignancy status: Secondary | ICD-10-CM | POA: Diagnosis not present

## 2023-08-28 DIAGNOSIS — Z510 Encounter for antineoplastic radiation therapy: Secondary | ICD-10-CM | POA: Diagnosis not present

## 2023-08-28 DIAGNOSIS — C61 Malignant neoplasm of prostate: Secondary | ICD-10-CM | POA: Diagnosis not present

## 2023-08-28 LAB — RAD ONC ARIA SESSION SUMMARY
Course Elapsed Days: 7
Plan Fractions Treated to Date: 6
Plan Prescribed Dose Per Fraction: 1.8 Gy
Plan Total Fractions Prescribed: 25
Plan Total Prescribed Dose: 45 Gy
Reference Point Dosage Given to Date: 10.8 Gy
Reference Point Session Dosage Given: 1.8 Gy
Session Number: 6

## 2023-08-28 NOTE — Progress Notes (Signed)
  Radiation Oncology         [phone] ________________________________  Name: Nicholas Paul MRN: [id number]  Date: 08/29/2023  DOB: [birth year]  STEREOTACTIC BODY RADIOTHERAPY SIMULATION AND TREATMENT PLANNING NOTE    ICD-10-CM   1. Prostate cancer metastatic to bone [facility])  C61    C79.51       DIAGNOSIS:  73 y.o. man with oligometastatic, Gleason 4+5, adenocarcinoma of the prostate involving  abdominopelvic lymph nodes and 3 bony lesions with a pretreatment PSA of 13.7.   NARRATIVE:  The patient was brought to the CT Simulation planning suite.  Identity was confirmed.  All relevant records and images related to the planned course of therapy were reviewed.  The patient freely provided informed written consent to proceed with treatment after reviewing the details related to the planned course of therapy. The consent form was witnessed and verified by the simulation staff.  Then, the patient was set-up in a stable reproducible  supine position for radiation therapy.  A BodyFix immobilization pillow was fabricated for reproducible positioning.  Surface markings were placed.  The CT images were loaded into the planning software.  The gross target volumes (GTV) and planning target volumes (PTV) were delinieated, and avoidance structures were contoured.  Treatment planning then occurred.  The radiation prescription was entered and confirmed.  A total of two complex treatment devices were fabricated in the form of the BodyFix immobilization pillow and a neck accuform cushion.  I have requested : 3D Simulation  I have requested a DVH of the following structures: targets and all normal structures near the target including spinal cord, esophagus, lungs, skin, chest wall, great vessel, brachial plexi and others as noted on the radiation plan to maintain doses in adherence with established limits  SPECIAL TREATMENT PROCEDURE:  The planned course of therapy using radiation constitutes a special treatment  procedure. Special care is required in the management of this patient for the following reasons. High dose per fraction requiring special monitoring for increased toxicities of treatment including daily imaging..  The special nature of the planned course of radiotherapy will require increased physician supervision and oversight to ensure patient's safety with optimal treatment outcomes.    This requires extended time and effort.    PLAN:  The oligometastatic lesion in the sternum will receive 40 Gy in 5 fractions.  ________________________________  Trilby Fujisawa Lorri Rota, M.D.

## 2023-08-29 ENCOUNTER — Other Ambulatory Visit: Payer: Self-pay

## 2023-08-29 ENCOUNTER — Ambulatory Visit
Admission: RE | Admit: 2023-08-29 | Discharge: 2023-08-29 | Disposition: A | Discharge status: Home / Self Care | Source: Ambulatory Visit | Attending: Radiation Oncology | Admitting: Radiation Oncology

## 2023-08-29 DIAGNOSIS — Z510 Encounter for antineoplastic radiation therapy: Secondary | ICD-10-CM | POA: Diagnosis not present

## 2023-08-29 DIAGNOSIS — C7951 Secondary malignant neoplasm of bone: Secondary | ICD-10-CM | POA: Diagnosis not present

## 2023-08-29 DIAGNOSIS — Z191 Hormone sensitive malignancy status: Secondary | ICD-10-CM | POA: Diagnosis not present

## 2023-08-29 DIAGNOSIS — C61 Malignant neoplasm of prostate: Secondary | ICD-10-CM | POA: Diagnosis not present

## 2023-08-29 LAB — RAD ONC ARIA SESSION SUMMARY
Course Elapsed Days: 8
Plan Fractions Treated to Date: 7
Plan Prescribed Dose Per Fraction: 1.8 Gy
Plan Total Fractions Prescribed: 25
Plan Total Prescribed Dose: 45 Gy
Reference Point Dosage Given to Date: 12.6 Gy
Reference Point Session Dosage Given: 1.8 Gy
Session Number: 7

## 2023-08-30 ENCOUNTER — Other Ambulatory Visit: Payer: Self-pay

## 2023-08-30 ENCOUNTER — Ambulatory Visit
Admission: RE | Admit: 2023-08-30 | Discharge: 2023-08-30 | Disposition: A | Discharge status: Home / Self Care | Source: Ambulatory Visit | Attending: Radiation Oncology | Admitting: Radiation Oncology

## 2023-08-30 DIAGNOSIS — C61 Malignant neoplasm of prostate: Secondary | ICD-10-CM | POA: Diagnosis not present

## 2023-08-30 DIAGNOSIS — Z510 Encounter for antineoplastic radiation therapy: Secondary | ICD-10-CM | POA: Diagnosis not present

## 2023-08-30 DIAGNOSIS — Z191 Hormone sensitive malignancy status: Secondary | ICD-10-CM | POA: Diagnosis not present

## 2023-08-30 DIAGNOSIS — C7951 Secondary malignant neoplasm of bone: Secondary | ICD-10-CM | POA: Diagnosis not present

## 2023-08-30 LAB — RAD ONC ARIA SESSION SUMMARY
Course Elapsed Days: 9
Plan Fractions Treated to Date: 8
Plan Prescribed Dose Per Fraction: 1.8 Gy
Plan Total Fractions Prescribed: 25
Plan Total Prescribed Dose: 45 Gy
Reference Point Dosage Given to Date: 14.4 Gy
Reference Point Session Dosage Given: 1.8 Gy
Session Number: 8

## 2023-09-02 ENCOUNTER — Other Ambulatory Visit: Payer: Self-pay

## 2023-09-02 ENCOUNTER — Ambulatory Visit
Admission: RE | Admit: 2023-09-02 | Discharge: 2023-09-02 | Disposition: A | Discharge status: Home / Self Care | Source: Ambulatory Visit | Attending: Radiation Oncology | Admitting: Radiation Oncology

## 2023-09-02 DIAGNOSIS — L570 Actinic keratosis: Secondary | ICD-10-CM | POA: Diagnosis not present

## 2023-09-02 DIAGNOSIS — Z510 Encounter for antineoplastic radiation therapy: Secondary | ICD-10-CM | POA: Diagnosis not present

## 2023-09-02 DIAGNOSIS — L308 Other specified dermatitis: Secondary | ICD-10-CM | POA: Diagnosis not present

## 2023-09-02 DIAGNOSIS — L98499 Non-pressure chronic ulcer of skin of other sites with unspecified severity: Secondary | ICD-10-CM | POA: Diagnosis not present

## 2023-09-02 DIAGNOSIS — Z191 Hormone sensitive malignancy status: Secondary | ICD-10-CM | POA: Diagnosis not present

## 2023-09-02 DIAGNOSIS — L821 Other seborrheic keratosis: Secondary | ICD-10-CM | POA: Diagnosis not present

## 2023-09-02 DIAGNOSIS — Z85828 Personal history of other malignant neoplasm of skin: Secondary | ICD-10-CM | POA: Diagnosis not present

## 2023-09-02 DIAGNOSIS — C7951 Secondary malignant neoplasm of bone: Secondary | ICD-10-CM | POA: Diagnosis not present

## 2023-09-02 DIAGNOSIS — C61 Malignant neoplasm of prostate: Secondary | ICD-10-CM | POA: Diagnosis not present

## 2023-09-02 DIAGNOSIS — D1801 Hemangioma of skin and subcutaneous tissue: Secondary | ICD-10-CM | POA: Diagnosis not present

## 2023-09-02 LAB — RAD ONC ARIA SESSION SUMMARY
Course Elapsed Days: 12
Plan Fractions Treated to Date: 9
Plan Prescribed Dose Per Fraction: 1.8 Gy
Plan Total Fractions Prescribed: 25
Plan Total Prescribed Dose: 45 Gy
Reference Point Dosage Given to Date: 16.2 Gy
Reference Point Session Dosage Given: 1.8 Gy
Session Number: 9

## 2023-09-03 ENCOUNTER — Ambulatory Visit
Admission: RE | Admit: 2023-09-03 | Discharge: 2023-09-03 | Disposition: A | Discharge status: Home / Self Care | Source: Ambulatory Visit | Attending: Radiation Oncology | Admitting: Radiation Oncology

## 2023-09-03 ENCOUNTER — Other Ambulatory Visit: Payer: Self-pay

## 2023-09-03 DIAGNOSIS — Z510 Encounter for antineoplastic radiation therapy: Secondary | ICD-10-CM | POA: Diagnosis not present

## 2023-09-03 DIAGNOSIS — Z191 Hormone sensitive malignancy status: Secondary | ICD-10-CM | POA: Diagnosis not present

## 2023-09-03 DIAGNOSIS — C7951 Secondary malignant neoplasm of bone: Secondary | ICD-10-CM | POA: Diagnosis not present

## 2023-09-03 DIAGNOSIS — C61 Malignant neoplasm of prostate: Secondary | ICD-10-CM | POA: Diagnosis not present

## 2023-09-03 LAB — RAD ONC ARIA SESSION SUMMARY
Course Elapsed Days: 13
Plan Fractions Treated to Date: 10
Plan Prescribed Dose Per Fraction: 1.8 Gy
Plan Total Fractions Prescribed: 25
Plan Total Prescribed Dose: 45 Gy
Reference Point Dosage Given to Date: 18 Gy
Reference Point Session Dosage Given: 1.8 Gy
Session Number: 10

## 2023-09-04 ENCOUNTER — Other Ambulatory Visit: Payer: Self-pay

## 2023-09-04 ENCOUNTER — Ambulatory Visit
Admission: RE | Admit: 2023-09-04 | Discharge: 2023-09-04 | Disposition: A | Discharge status: Home / Self Care | Source: Ambulatory Visit | Attending: Radiation Oncology | Admitting: Radiation Oncology

## 2023-09-04 DIAGNOSIS — Z510 Encounter for antineoplastic radiation therapy: Secondary | ICD-10-CM | POA: Diagnosis not present

## 2023-09-04 DIAGNOSIS — Z191 Hormone sensitive malignancy status: Secondary | ICD-10-CM | POA: Diagnosis not present

## 2023-09-04 DIAGNOSIS — C7951 Secondary malignant neoplasm of bone: Secondary | ICD-10-CM | POA: Diagnosis not present

## 2023-09-04 DIAGNOSIS — C61 Malignant neoplasm of prostate: Secondary | ICD-10-CM | POA: Diagnosis not present

## 2023-09-04 LAB — RAD ONC ARIA SESSION SUMMARY
Course Elapsed Days: 14
Plan Fractions Treated to Date: 11
Plan Prescribed Dose Per Fraction: 1.8 Gy
Plan Total Fractions Prescribed: 25
Plan Total Prescribed Dose: 45 Gy
Reference Point Dosage Given to Date: 19.8 Gy
Reference Point Session Dosage Given: 1.8 Gy
Session Number: 11

## 2023-09-05 ENCOUNTER — Other Ambulatory Visit: Payer: Self-pay

## 2023-09-05 ENCOUNTER — Ambulatory Visit
Admission: RE | Admit: 2023-09-05 | Discharge: 2023-09-05 | Disposition: A | Discharge status: Home / Self Care | Source: Ambulatory Visit | Attending: Radiation Oncology | Admitting: Radiation Oncology

## 2023-09-05 DIAGNOSIS — C7951 Secondary malignant neoplasm of bone: Secondary | ICD-10-CM | POA: Diagnosis not present

## 2023-09-05 DIAGNOSIS — C61 Malignant neoplasm of prostate: Secondary | ICD-10-CM | POA: Diagnosis not present

## 2023-09-05 DIAGNOSIS — Z510 Encounter for antineoplastic radiation therapy: Secondary | ICD-10-CM | POA: Diagnosis not present

## 2023-09-05 DIAGNOSIS — Z191 Hormone sensitive malignancy status: Secondary | ICD-10-CM | POA: Diagnosis not present

## 2023-09-05 LAB — RAD ONC ARIA SESSION SUMMARY
Course Elapsed Days: 15
Plan Fractions Treated to Date: 12
Plan Prescribed Dose Per Fraction: 1.8 Gy
Plan Total Fractions Prescribed: 25
Plan Total Prescribed Dose: 45 Gy
Reference Point Dosage Given to Date: 21.6 Gy
Reference Point Session Dosage Given: 1.8 Gy
Session Number: 12

## 2023-09-06 ENCOUNTER — Ambulatory Visit
Admission: RE | Admit: 2023-09-06 | Discharge: 2023-09-06 | Disposition: A | Discharge status: Home / Self Care | Source: Ambulatory Visit | Attending: Radiation Oncology | Admitting: Radiation Oncology

## 2023-09-06 ENCOUNTER — Other Ambulatory Visit: Payer: Self-pay

## 2023-09-06 ENCOUNTER — Ambulatory Visit: Admitting: Internal Medicine

## 2023-09-06 DIAGNOSIS — Z510 Encounter for antineoplastic radiation therapy: Secondary | ICD-10-CM | POA: Diagnosis not present

## 2023-09-06 DIAGNOSIS — Z191 Hormone sensitive malignancy status: Secondary | ICD-10-CM | POA: Diagnosis not present

## 2023-09-06 DIAGNOSIS — C61 Malignant neoplasm of prostate: Secondary | ICD-10-CM | POA: Diagnosis not present

## 2023-09-06 DIAGNOSIS — C7951 Secondary malignant neoplasm of bone: Secondary | ICD-10-CM | POA: Diagnosis not present

## 2023-09-06 LAB — RAD ONC ARIA SESSION SUMMARY
Course Elapsed Days: 16
Plan Fractions Treated to Date: 13
Plan Prescribed Dose Per Fraction: 1.8 Gy
Plan Total Fractions Prescribed: 25
Plan Total Prescribed Dose: 45 Gy
Reference Point Dosage Given to Date: 23.4 Gy
Reference Point Session Dosage Given: 1.8 Gy
Session Number: 13

## 2023-09-09 ENCOUNTER — Ambulatory Visit
Admission: RE | Admit: 2023-09-09 | Discharge: 2023-09-09 | Disposition: A | Discharge status: Home / Self Care | Source: Ambulatory Visit | Attending: Radiation Oncology

## 2023-09-09 ENCOUNTER — Other Ambulatory Visit: Payer: Self-pay

## 2023-09-09 ENCOUNTER — Ambulatory Visit: Payer: Medicare Other | Admitting: Nurse Practitioner

## 2023-09-09 DIAGNOSIS — Z191 Hormone sensitive malignancy status: Secondary | ICD-10-CM | POA: Diagnosis not present

## 2023-09-09 DIAGNOSIS — C7951 Secondary malignant neoplasm of bone: Secondary | ICD-10-CM | POA: Diagnosis not present

## 2023-09-09 DIAGNOSIS — C61 Malignant neoplasm of prostate: Secondary | ICD-10-CM | POA: Diagnosis not present

## 2023-09-09 DIAGNOSIS — Z510 Encounter for antineoplastic radiation therapy: Secondary | ICD-10-CM | POA: Diagnosis not present

## 2023-09-09 LAB — RAD ONC ARIA SESSION SUMMARY
Course Elapsed Days: 19
Plan Fractions Treated to Date: 1
Plan Fractions Treated to Date: 14
Plan Prescribed Dose Per Fraction: 1.8 Gy
Plan Prescribed Dose Per Fraction: 8 Gy
Plan Total Fractions Prescribed: 25
Plan Total Fractions Prescribed: 5
Plan Total Prescribed Dose: 40 Gy
Plan Total Prescribed Dose: 45 Gy
Reference Point Dosage Given to Date: 25.2 Gy
Reference Point Dosage Given to Date: 8 Gy
Reference Point Session Dosage Given: 1.8 Gy
Reference Point Session Dosage Given: 8 Gy
Session Number: 14

## 2023-09-10 ENCOUNTER — Ambulatory Visit
Admission: RE | Admit: 2023-09-10 | Discharge: 2023-09-10 | Disposition: A | Discharge status: Home / Self Care | Source: Ambulatory Visit | Attending: Radiation Oncology

## 2023-09-10 ENCOUNTER — Other Ambulatory Visit: Payer: Self-pay

## 2023-09-10 ENCOUNTER — Ambulatory Visit

## 2023-09-10 DIAGNOSIS — C7951 Secondary malignant neoplasm of bone: Secondary | ICD-10-CM | POA: Diagnosis not present

## 2023-09-10 DIAGNOSIS — Z510 Encounter for antineoplastic radiation therapy: Secondary | ICD-10-CM | POA: Diagnosis not present

## 2023-09-10 DIAGNOSIS — C61 Malignant neoplasm of prostate: Secondary | ICD-10-CM | POA: Diagnosis not present

## 2023-09-10 DIAGNOSIS — Z191 Hormone sensitive malignancy status: Secondary | ICD-10-CM | POA: Diagnosis not present

## 2023-09-10 LAB — RAD ONC ARIA SESSION SUMMARY
Course Elapsed Days: 20
Plan Fractions Treated to Date: 15
Plan Prescribed Dose Per Fraction: 1.8 Gy
Plan Total Fractions Prescribed: 25
Plan Total Prescribed Dose: 45 Gy
Reference Point Dosage Given to Date: 27 Gy
Reference Point Session Dosage Given: 1.8 Gy
Session Number: 15

## 2023-09-11 ENCOUNTER — Other Ambulatory Visit: Payer: Self-pay

## 2023-09-11 ENCOUNTER — Ambulatory Visit
Admission: RE | Admit: 2023-09-11 | Discharge: 2023-09-11 | Disposition: A | Discharge status: Home / Self Care | Source: Ambulatory Visit | Attending: Radiation Oncology

## 2023-09-11 DIAGNOSIS — Z191 Hormone sensitive malignancy status: Secondary | ICD-10-CM | POA: Diagnosis not present

## 2023-09-11 DIAGNOSIS — Z510 Encounter for antineoplastic radiation therapy: Secondary | ICD-10-CM | POA: Diagnosis not present

## 2023-09-11 DIAGNOSIS — C7951 Secondary malignant neoplasm of bone: Secondary | ICD-10-CM | POA: Diagnosis not present

## 2023-09-11 DIAGNOSIS — C61 Malignant neoplasm of prostate: Secondary | ICD-10-CM | POA: Diagnosis not present

## 2023-09-11 LAB — RAD ONC ARIA SESSION SUMMARY
Course Elapsed Days: 21
Plan Fractions Treated to Date: 16
Plan Fractions Treated to Date: 2
Plan Prescribed Dose Per Fraction: 1.8 Gy
Plan Prescribed Dose Per Fraction: 8 Gy
Plan Total Fractions Prescribed: 25
Plan Total Fractions Prescribed: 5
Plan Total Prescribed Dose: 40 Gy
Plan Total Prescribed Dose: 45 Gy
Reference Point Dosage Given to Date: 16 Gy
Reference Point Dosage Given to Date: 28.8 Gy
Reference Point Session Dosage Given: 1.8 Gy
Reference Point Session Dosage Given: 8 Gy
Session Number: 16

## 2023-09-12 ENCOUNTER — Ambulatory Visit

## 2023-09-12 ENCOUNTER — Ambulatory Visit
Admission: RE | Admit: 2023-09-12 | Discharge: 2023-09-12 | Disposition: A | Discharge status: Home / Self Care | Source: Ambulatory Visit | Attending: Radiation Oncology | Admitting: Radiation Oncology

## 2023-09-12 ENCOUNTER — Other Ambulatory Visit: Payer: Self-pay

## 2023-09-12 DIAGNOSIS — C7951 Secondary malignant neoplasm of bone: Secondary | ICD-10-CM | POA: Insufficient documentation

## 2023-09-12 DIAGNOSIS — Z510 Encounter for antineoplastic radiation therapy: Secondary | ICD-10-CM | POA: Diagnosis not present

## 2023-09-12 DIAGNOSIS — C61 Malignant neoplasm of prostate: Secondary | ICD-10-CM | POA: Diagnosis not present

## 2023-09-12 DIAGNOSIS — Z191 Hormone sensitive malignancy status: Secondary | ICD-10-CM | POA: Diagnosis not present

## 2023-09-12 LAB — RAD ONC ARIA SESSION SUMMARY
Course Elapsed Days: 22
Plan Fractions Treated to Date: 17
Plan Prescribed Dose Per Fraction: 1.8 Gy
Plan Total Fractions Prescribed: 25
Plan Total Prescribed Dose: 45 Gy
Reference Point Dosage Given to Date: 30.6 Gy
Reference Point Session Dosage Given: 1.8 Gy
Session Number: 17

## 2023-09-13 ENCOUNTER — Other Ambulatory Visit: Payer: Self-pay

## 2023-09-13 ENCOUNTER — Ambulatory Visit
Admission: RE | Admit: 2023-09-13 | Discharge: 2023-09-13 | Disposition: A | Discharge status: Home / Self Care | Source: Ambulatory Visit | Attending: Radiation Oncology | Admitting: Radiation Oncology

## 2023-09-13 ENCOUNTER — Ambulatory Visit
Admission: RE | Admit: 2023-09-13 | Discharge: 2023-09-13 | Disposition: A | Discharge status: Home / Self Care | Source: Ambulatory Visit | Attending: Radiation Oncology

## 2023-09-13 DIAGNOSIS — C7951 Secondary malignant neoplasm of bone: Secondary | ICD-10-CM | POA: Diagnosis not present

## 2023-09-13 DIAGNOSIS — Z191 Hormone sensitive malignancy status: Secondary | ICD-10-CM | POA: Diagnosis not present

## 2023-09-13 DIAGNOSIS — C61 Malignant neoplasm of prostate: Secondary | ICD-10-CM | POA: Diagnosis not present

## 2023-09-13 DIAGNOSIS — Z510 Encounter for antineoplastic radiation therapy: Secondary | ICD-10-CM | POA: Diagnosis not present

## 2023-09-13 LAB — RAD ONC ARIA SESSION SUMMARY
Course Elapsed Days: 23
Plan Fractions Treated to Date: 18
Plan Fractions Treated to Date: 3
Plan Prescribed Dose Per Fraction: 1.8 Gy
Plan Prescribed Dose Per Fraction: 8 Gy
Plan Total Fractions Prescribed: 25
Plan Total Fractions Prescribed: 5
Plan Total Prescribed Dose: 40 Gy
Plan Total Prescribed Dose: 45 Gy
Reference Point Dosage Given to Date: 24 Gy
Reference Point Dosage Given to Date: 32.4 Gy
Reference Point Session Dosage Given: 1.8 Gy
Reference Point Session Dosage Given: 8 Gy
Session Number: 18

## 2023-09-16 ENCOUNTER — Other Ambulatory Visit: Payer: Self-pay

## 2023-09-16 ENCOUNTER — Ambulatory Visit
Admission: RE | Admit: 2023-09-16 | Discharge: 2023-09-16 | Disposition: A | Discharge status: Home / Self Care | Source: Ambulatory Visit | Attending: Radiation Oncology | Admitting: Radiation Oncology

## 2023-09-16 DIAGNOSIS — C7951 Secondary malignant neoplasm of bone: Secondary | ICD-10-CM | POA: Diagnosis not present

## 2023-09-16 DIAGNOSIS — Z191 Hormone sensitive malignancy status: Secondary | ICD-10-CM | POA: Diagnosis not present

## 2023-09-16 DIAGNOSIS — Z510 Encounter for antineoplastic radiation therapy: Secondary | ICD-10-CM | POA: Diagnosis not present

## 2023-09-16 DIAGNOSIS — C61 Malignant neoplasm of prostate: Secondary | ICD-10-CM | POA: Diagnosis not present

## 2023-09-16 LAB — RAD ONC ARIA SESSION SUMMARY
Course Elapsed Days: 26
Plan Fractions Treated to Date: 19
Plan Prescribed Dose Per Fraction: 1.8 Gy
Plan Total Fractions Prescribed: 25
Plan Total Prescribed Dose: 45 Gy
Reference Point Dosage Given to Date: 34.2 Gy
Reference Point Session Dosage Given: 1.8 Gy
Session Number: 19

## 2023-09-17 ENCOUNTER — Ambulatory Visit: Admitting: Nurse Practitioner

## 2023-09-17 ENCOUNTER — Other Ambulatory Visit: Payer: Self-pay

## 2023-09-17 ENCOUNTER — Ambulatory Visit
Admission: RE | Admit: 2023-09-17 | Discharge: 2023-09-17 | Disposition: A | Discharge status: Home / Self Care | Source: Ambulatory Visit | Attending: Radiation Oncology | Admitting: Radiation Oncology

## 2023-09-17 ENCOUNTER — Ambulatory Visit
Admission: RE | Admit: 2023-09-17 | Discharge: 2023-09-17 | Disposition: A | Discharge status: Home / Self Care | Source: Ambulatory Visit | Attending: Radiation Oncology

## 2023-09-17 DIAGNOSIS — C61 Malignant neoplasm of prostate: Secondary | ICD-10-CM | POA: Diagnosis not present

## 2023-09-17 DIAGNOSIS — C7951 Secondary malignant neoplasm of bone: Secondary | ICD-10-CM | POA: Diagnosis not present

## 2023-09-17 DIAGNOSIS — Z191 Hormone sensitive malignancy status: Secondary | ICD-10-CM | POA: Diagnosis not present

## 2023-09-17 DIAGNOSIS — Z510 Encounter for antineoplastic radiation therapy: Secondary | ICD-10-CM | POA: Diagnosis not present

## 2023-09-17 LAB — RAD ONC ARIA SESSION SUMMARY
Course Elapsed Days: 27
Plan Fractions Treated to Date: 20
Plan Fractions Treated to Date: 4
Plan Prescribed Dose Per Fraction: 1.8 Gy
Plan Prescribed Dose Per Fraction: 8 Gy
Plan Total Fractions Prescribed: 25
Plan Total Fractions Prescribed: 5
Plan Total Prescribed Dose: 40 Gy
Plan Total Prescribed Dose: 45 Gy
Reference Point Dosage Given to Date: 32 Gy
Reference Point Dosage Given to Date: 36 Gy
Reference Point Session Dosage Given: 1.8 Gy
Reference Point Session Dosage Given: 8 Gy
Session Number: 20

## 2023-09-18 ENCOUNTER — Other Ambulatory Visit: Payer: Self-pay

## 2023-09-18 ENCOUNTER — Ambulatory Visit
Admission: RE | Admit: 2023-09-18 | Discharge: 2023-09-18 | Disposition: A | Discharge status: Home / Self Care | Source: Ambulatory Visit | Attending: Radiation Oncology

## 2023-09-18 DIAGNOSIS — C61 Malignant neoplasm of prostate: Secondary | ICD-10-CM | POA: Diagnosis not present

## 2023-09-18 DIAGNOSIS — Z510 Encounter for antineoplastic radiation therapy: Secondary | ICD-10-CM | POA: Diagnosis not present

## 2023-09-18 DIAGNOSIS — C7951 Secondary malignant neoplasm of bone: Secondary | ICD-10-CM | POA: Diagnosis not present

## 2023-09-18 DIAGNOSIS — Z191 Hormone sensitive malignancy status: Secondary | ICD-10-CM | POA: Diagnosis not present

## 2023-09-18 LAB — RAD ONC ARIA SESSION SUMMARY
Course Elapsed Days: 28
Plan Fractions Treated to Date: 21
Plan Prescribed Dose Per Fraction: 1.8 Gy
Plan Total Fractions Prescribed: 25
Plan Total Prescribed Dose: 45 Gy
Reference Point Dosage Given to Date: 37.8 Gy
Reference Point Session Dosage Given: 1.8 Gy
Session Number: 21

## 2023-09-19 ENCOUNTER — Ambulatory Visit
Admission: RE | Admit: 2023-09-19 | Discharge: 2023-09-19 | Disposition: A | Discharge status: Home / Self Care | Source: Ambulatory Visit | Attending: Radiation Oncology | Admitting: Radiation Oncology

## 2023-09-19 ENCOUNTER — Other Ambulatory Visit: Payer: Self-pay

## 2023-09-19 ENCOUNTER — Ambulatory Visit

## 2023-09-19 DIAGNOSIS — C7951 Secondary malignant neoplasm of bone: Secondary | ICD-10-CM | POA: Diagnosis not present

## 2023-09-19 DIAGNOSIS — Z191 Hormone sensitive malignancy status: Secondary | ICD-10-CM | POA: Diagnosis not present

## 2023-09-19 DIAGNOSIS — Z510 Encounter for antineoplastic radiation therapy: Secondary | ICD-10-CM | POA: Diagnosis not present

## 2023-09-19 DIAGNOSIS — C61 Malignant neoplasm of prostate: Secondary | ICD-10-CM | POA: Diagnosis not present

## 2023-09-19 LAB — RAD ONC ARIA SESSION SUMMARY
Course Elapsed Days: 29
Plan Fractions Treated to Date: 22
Plan Fractions Treated to Date: 5
Plan Prescribed Dose Per Fraction: 1.8 Gy
Plan Prescribed Dose Per Fraction: 8 Gy
Plan Total Fractions Prescribed: 25
Plan Total Fractions Prescribed: 5
Plan Total Prescribed Dose: 40 Gy
Plan Total Prescribed Dose: 45 Gy
Reference Point Dosage Given to Date: 39.6 Gy
Reference Point Dosage Given to Date: 40 Gy
Reference Point Session Dosage Given: 1.8 Gy
Reference Point Session Dosage Given: 8 Gy
Session Number: 22

## 2023-09-20 ENCOUNTER — Other Ambulatory Visit: Payer: Self-pay

## 2023-09-20 ENCOUNTER — Ambulatory Visit
Admission: RE | Admit: 2023-09-20 | Discharge: 2023-09-20 | Disposition: A | Discharge status: Home / Self Care | Source: Ambulatory Visit | Attending: Radiation Oncology | Admitting: Radiation Oncology

## 2023-09-20 ENCOUNTER — Ambulatory Visit
Admission: RE | Admit: 2023-09-20 | Discharge: 2023-09-20 | Disposition: A | Discharge status: Home / Self Care | Source: Ambulatory Visit | Attending: Radiation Oncology

## 2023-09-20 DIAGNOSIS — Z510 Encounter for antineoplastic radiation therapy: Secondary | ICD-10-CM | POA: Diagnosis not present

## 2023-09-20 DIAGNOSIS — C7951 Secondary malignant neoplasm of bone: Secondary | ICD-10-CM | POA: Diagnosis not present

## 2023-09-20 DIAGNOSIS — Z191 Hormone sensitive malignancy status: Secondary | ICD-10-CM | POA: Diagnosis not present

## 2023-09-20 DIAGNOSIS — C61 Malignant neoplasm of prostate: Secondary | ICD-10-CM | POA: Diagnosis not present

## 2023-09-20 LAB — RAD ONC ARIA SESSION SUMMARY
Course Elapsed Days: 30
Plan Fractions Treated to Date: 23
Plan Prescribed Dose Per Fraction: 1.8 Gy
Plan Total Fractions Prescribed: 25
Plan Total Prescribed Dose: 45 Gy
Reference Point Dosage Given to Date: 41.4 Gy
Reference Point Session Dosage Given: 1.8 Gy
Session Number: 23

## 2023-09-23 ENCOUNTER — Other Ambulatory Visit: Payer: Self-pay

## 2023-09-23 ENCOUNTER — Ambulatory Visit
Admission: RE | Admit: 2023-09-23 | Discharge: 2023-09-23 | Disposition: A | Discharge status: Home / Self Care | Source: Ambulatory Visit | Attending: Radiation Oncology | Admitting: Radiation Oncology

## 2023-09-23 DIAGNOSIS — C61 Malignant neoplasm of prostate: Secondary | ICD-10-CM | POA: Diagnosis not present

## 2023-09-23 DIAGNOSIS — Z510 Encounter for antineoplastic radiation therapy: Secondary | ICD-10-CM | POA: Diagnosis not present

## 2023-09-23 DIAGNOSIS — C7951 Secondary malignant neoplasm of bone: Secondary | ICD-10-CM | POA: Diagnosis not present

## 2023-09-23 DIAGNOSIS — Z191 Hormone sensitive malignancy status: Secondary | ICD-10-CM | POA: Diagnosis not present

## 2023-09-23 LAB — RAD ONC ARIA SESSION SUMMARY
Course Elapsed Days: 33
Plan Fractions Treated to Date: 24
Plan Prescribed Dose Per Fraction: 1.8 Gy
Plan Total Fractions Prescribed: 25
Plan Total Prescribed Dose: 45 Gy
Reference Point Dosage Given to Date: 43.2 Gy
Reference Point Session Dosage Given: 1.8 Gy
Session Number: 24

## 2023-09-24 ENCOUNTER — Other Ambulatory Visit: Payer: Self-pay

## 2023-09-24 ENCOUNTER — Ambulatory Visit
Admission: RE | Admit: 2023-09-24 | Discharge: 2023-09-24 | Disposition: A | Discharge status: Home / Self Care | Source: Ambulatory Visit | Attending: Radiation Oncology

## 2023-09-24 DIAGNOSIS — C7951 Secondary malignant neoplasm of bone: Secondary | ICD-10-CM | POA: Diagnosis not present

## 2023-09-24 DIAGNOSIS — Z191 Hormone sensitive malignancy status: Secondary | ICD-10-CM | POA: Diagnosis not present

## 2023-09-24 DIAGNOSIS — C61 Malignant neoplasm of prostate: Secondary | ICD-10-CM | POA: Diagnosis not present

## 2023-09-24 DIAGNOSIS — Z510 Encounter for antineoplastic radiation therapy: Secondary | ICD-10-CM | POA: Diagnosis not present

## 2023-09-24 LAB — RAD ONC ARIA SESSION SUMMARY
Course Elapsed Days: 34
Plan Fractions Treated to Date: 25
Plan Prescribed Dose Per Fraction: 1.8 Gy
Plan Total Fractions Prescribed: 25
Plan Total Prescribed Dose: 45 Gy
Reference Point Dosage Given to Date: 45 Gy
Reference Point Session Dosage Given: 1.8 Gy
Session Number: 25

## 2023-09-25 ENCOUNTER — Ambulatory Visit
Admission: RE | Admit: 2023-09-25 | Discharge: 2023-09-25 | Disposition: A | Discharge status: Home / Self Care | Source: Ambulatory Visit | Attending: Radiation Oncology | Admitting: Radiation Oncology

## 2023-09-25 ENCOUNTER — Other Ambulatory Visit: Payer: Self-pay

## 2023-09-25 DIAGNOSIS — Z510 Encounter for antineoplastic radiation therapy: Secondary | ICD-10-CM | POA: Diagnosis not present

## 2023-09-25 DIAGNOSIS — Z191 Hormone sensitive malignancy status: Secondary | ICD-10-CM | POA: Diagnosis not present

## 2023-09-25 DIAGNOSIS — C7951 Secondary malignant neoplasm of bone: Secondary | ICD-10-CM | POA: Diagnosis not present

## 2023-09-25 DIAGNOSIS — C61 Malignant neoplasm of prostate: Secondary | ICD-10-CM | POA: Diagnosis not present

## 2023-09-25 LAB — RAD ONC ARIA SESSION SUMMARY
Course Elapsed Days: 35
Plan Fractions Treated to Date: 1
Plan Prescribed Dose Per Fraction: 2 Gy
Plan Total Fractions Prescribed: 15
Plan Total Prescribed Dose: 30 Gy
Reference Point Dosage Given to Date: 2 Gy
Reference Point Session Dosage Given: 2 Gy
Session Number: 26

## 2023-09-26 ENCOUNTER — Ambulatory Visit
Admission: RE | Admit: 2023-09-26 | Discharge: 2023-09-26 | Disposition: A | Discharge status: Home / Self Care | Source: Ambulatory Visit | Attending: Radiation Oncology | Admitting: Radiation Oncology

## 2023-09-26 ENCOUNTER — Other Ambulatory Visit: Payer: Self-pay

## 2023-09-26 DIAGNOSIS — Z510 Encounter for antineoplastic radiation therapy: Secondary | ICD-10-CM | POA: Diagnosis not present

## 2023-09-26 DIAGNOSIS — C61 Malignant neoplasm of prostate: Secondary | ICD-10-CM | POA: Diagnosis not present

## 2023-09-26 DIAGNOSIS — Z191 Hormone sensitive malignancy status: Secondary | ICD-10-CM | POA: Diagnosis not present

## 2023-09-26 DIAGNOSIS — C7951 Secondary malignant neoplasm of bone: Secondary | ICD-10-CM | POA: Diagnosis not present

## 2023-09-26 LAB — RAD ONC ARIA SESSION SUMMARY
Course Elapsed Days: 36
Plan Fractions Treated to Date: 2
Plan Prescribed Dose Per Fraction: 2 Gy
Plan Total Fractions Prescribed: 15
Plan Total Prescribed Dose: 30 Gy
Reference Point Dosage Given to Date: 4 Gy
Reference Point Session Dosage Given: 2 Gy
Session Number: 27

## 2023-09-27 ENCOUNTER — Ambulatory Visit

## 2023-09-27 ENCOUNTER — Other Ambulatory Visit: Payer: Self-pay | Admitting: *Deleted

## 2023-09-27 ENCOUNTER — Ambulatory Visit: Admission: RE | Admit: 2023-09-27 | Source: Ambulatory Visit

## 2023-09-27 DIAGNOSIS — C7951 Secondary malignant neoplasm of bone: Secondary | ICD-10-CM

## 2023-09-29 ENCOUNTER — Other Ambulatory Visit: Payer: Self-pay

## 2023-09-29 DIAGNOSIS — D6481 Anemia due to antineoplastic chemotherapy: Secondary | ICD-10-CM

## 2023-09-29 DIAGNOSIS — C61 Malignant neoplasm of prostate: Secondary | ICD-10-CM

## 2023-09-29 NOTE — Progress Notes (Signed)
 Nicholas Paul OFFICE PROGRESS NOTE  Patient Care Team: Colene Dauphin, MD as PCP - General (Internal Medicine) Audery Blazing Deannie Fabian, MD as PCP - Cardiology (Cardiology) Kerry Peel., MD as Consulting Physician (Urology) [facility] [place] as Consulting Physician (Optometry) Katheleen Palmer, RN as Oncology Nurse Navigator Lowanda Ruddy, MD as Consulting Physician (Oncology) [facility])  73 y.o.man with h/o hypertension, hyperlipidemia, BPH here for follow up for metastatic prostate cancer.  He has excellent performance status despite his age.    Current diagnosis: mHSPC.  Initial diagnosis: mHSPC. PSA 12.4 with GG5 (Gleason 4+5=9). High volume disease with nonregional and regional lymph nodes, and 4 sites of bone metastases. Germline: Negative genetic testing on the CancerNext-Expanded+RNAinsight pane  Somatic: not yet. Will perform upon progression Treatment: 12/2022. Started relugolix and 01/17/23 darolutamide  03/08/23 C1D1 docetazel 03/29/23 C2D1 docetaxel  04/18/23 PSA <0.1 04/18/24 C3D1 docetaxel  05/17/23 C4 docetaxel    08/21/23 started radiation.  Current going through radiation. Tolerating well.  May return next month for monitor for cytopenia.  He is getting daro and relugolix from [facility] monthly. Assessment & Plan Prostate cancer metastatic to bone (HCC) Continue Orgovyx and darolutamide Follow up to complete radiation  Follow up in about 4-5 weeks with labs. Essential hypertension Currently on amlodipine , hydrochlorothiazide  and benazepril.  Monitor BP at home. Mostly <140. Other hyperlipidemia On atorvastatin  No signs of SE Continue monitor for toxicity with ARPI At risk for side effect of medication 03/2023 normal bone mineral density study calcium  (1000-1200 mg daily from food and supplements) and vitamin D3 (1000 IU daily) Healthy diet to prevent diabetes Control HTN, HLD. Weight-bearing exercises (30 minutes per  day) Limit alcohol consumption and avoid smoking He should continue to see his cardiology team to optimize his cardiac condition while on treatment for active prostate cancer CBC, CMP, T&S next months  Orders Placed This Encounter  Procedures   CBC with Differential ([facility] Only)    Standing Status:   Future    Expiration Date:   09/29/2024   CMP (Cancer [facility] only)    Standing Status:   Future    Expiration Date:   09/29/2024   Sample to Blood Bank    Standing Status:   Future    Expiration Date:   09/29/2024     Lowanda Ruddy, MD  INTERVAL HISTORY: [place] returns for treatment follow-up. Overall feeling better in the last 4 days. Able to exercises, play golf over the weekend. No trouble urinating. No bloody urine.  Will finish radiation on 6/5.  Oncology History  Prostate cancer metastatic to bone (HCC)  07/15/2017 Tumor Marker   PSA 1.89   09/27/2017 Tumor Marker   PSA 2.26   11/06/2018 Tumor Marker   PSA 3.04   04/22/2019 Tumor Marker   PSA 2.97   12/23/2019 Tumor Marker   PSA 1.76   12/15/2020 Tumor Marker   PSA 2.43   12/18/2021 Tumor Marker   PSA 3.76   11/27/2022 Surgery   TURP for BPH with Lower urinary tract symptoms. High risk T1b GG5 (Gleason 4+5=9) in 30% of resected tissue.   12/24/2022 Tumor Marker   PSA 12.4   01/01/2023 PET scan   PSMA PET Radiotracer avid glandular noted on the right aspect of the prostate.  Potential more central activity difficult to separate from the urine activity. Focal radiotracer activity within the right seminal vesicle consistent with prostate adenocarcinoma metastases Radiotracer avid right and left iliac lymph nodes  consistent with nodal metastases.  Radiotracer avid periaortic retroperitoneal lymph node consistent with nodal metastasis Skeletal metastases including sternum, L3 vertebral body, and left iliac bone.   01/09/2023 -  Chemotherapy   Started Orgovyx 01/17/23 darolutamide   01/17/2023 Initial Diagnosis    Prostate cancer metastatic to bone (HCC)   01/18/2023 Cancer Staging   Staging form: Prostate, AJCC 8th Edition - Clinical: Stage IVB (cT1b, cN1, cM1b, PSA: 12.4, Grade Group: 5) - Signed by Lowanda Ruddy, MD on 01/18/2023 Prostate specific antigen (PSA) range: 10 to 19 Gleason primary pattern: 4 Gleason secondary pattern: 5 Histologic grading system: 5 grade system   02/13/2023 Genetic Testing   [employer] Negative for pathologic variant   03/01/2023 Genetic Testing   Negative genetic testing on the CancerNext-Expanded+RNAinsight panel.  The report date is 03/01/2023.  The CancerNext-Expanded gene panel offered by [facility] and includes sequencing and rearrangement analysis for the following 77 genes: AIP, ALK, APC*, ATM*, AXIN2, BAP1, BARD1, BMPR1A, BRCA1*, BRCA2*, BRIP1*, CDC73, CDH1*, CDK4, CDKN1B, CDKN2A, CHEK2*, CTNNA1, DICER1, FH, FLCN, KIF1B, LZTR1, MAX, MEN1, MET, MLH1*, MSH2*, MSH3, MSH6*, MUTYH*, NF1*, NF2, NTHL1, PALB2*, PHOX2B, PMS2*, POT1, PRKAR1A, PTCH1, PTEN*, RAD51C*, RAD51D*, RB1, RET, SDHA, SDHAF2, SDHB, SDHC, SDHD, SMAD4, SMARCA4, SMARCB1, SMARCE1, STK11, SUFU, TMEM127, TP53*, TSC1, TSC2, and VHL (sequencing and deletion/duplication); EGFR, EGLN1, HOXB13, KIT, MITF, PDGFRA, POLD1, and POLE (sequencing only); EPCAM and GREM1 (deletion/duplication only). DNA and RNA analyses performed for * genes.    03/04/2023 Genetic Testing   Patient has genetic testing done for prostate cancer. Results were negative for pathologic mutations from [facility] 71 genes panel.   03/08/2023 - 05/20/2023 Chemotherapy   Patient is on Treatment Plan : PROSTATE Docetaxel  (74) q21d     06/18/2023 PET scan   PSMA PET IMPRESSION: 1. 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 2. Persistent right-sided prostatic tracer affinity, suspicious for residual disease. 3. Relatively similar osseous metastasis, presumably having undergone interval  healing is evidenced by increased sclerosis.      PHYSICAL EXAMINATION: ECOG PERFORMANCE STATUS: 0 - Asymptomatic  Vitals:   09/30/23 0839  BP: 135/71  Pulse: (!) 56  Resp: 20  Temp: 97.7 F (36.5 C)  SpO2: 100%   Filed Weights   09/30/23 0839  Weight: 172 lb (78 kg)   No acute distress. Skin color normal. No paleness or jaundice  Relevant data reviewed during this visit included labs

## 2023-09-30 ENCOUNTER — Inpatient Hospital Stay: Payer: Medicare Other

## 2023-09-30 ENCOUNTER — Ambulatory Visit
Admission: RE | Admit: 2023-09-30 | Discharge: 2023-09-30 | Disposition: A | Discharge status: Home / Self Care | Source: Ambulatory Visit | Attending: Radiation Oncology | Admitting: Radiation Oncology

## 2023-09-30 ENCOUNTER — Inpatient Hospital Stay (HOSPITAL_BASED_OUTPATIENT_CLINIC_OR_DEPARTMENT_OTHER): Payer: Medicare Other

## 2023-09-30 ENCOUNTER — Other Ambulatory Visit: Payer: Self-pay

## 2023-09-30 ENCOUNTER — Ambulatory Visit

## 2023-09-30 ENCOUNTER — Other Ambulatory Visit: Payer: Medicare Other

## 2023-09-30 VITALS — BP 135/71 | HR 56 | Temp 97.7°F | Resp 20 | Wt 172.0 lb

## 2023-09-30 DIAGNOSIS — Z79899 Other long term (current) drug therapy: Secondary | ICD-10-CM | POA: Insufficient documentation

## 2023-09-30 DIAGNOSIS — Z923 Personal history of irradiation: Secondary | ICD-10-CM | POA: Insufficient documentation

## 2023-09-30 DIAGNOSIS — C61 Malignant neoplasm of prostate: Secondary | ICD-10-CM | POA: Diagnosis not present

## 2023-09-30 DIAGNOSIS — Z510 Encounter for antineoplastic radiation therapy: Secondary | ICD-10-CM | POA: Diagnosis not present

## 2023-09-30 DIAGNOSIS — C7951 Secondary malignant neoplasm of bone: Secondary | ICD-10-CM | POA: Insufficient documentation

## 2023-09-30 DIAGNOSIS — Z191 Hormone sensitive malignancy status: Secondary | ICD-10-CM | POA: Diagnosis not present

## 2023-09-30 DIAGNOSIS — E7849 Other hyperlipidemia: Secondary | ICD-10-CM | POA: Diagnosis not present

## 2023-09-30 DIAGNOSIS — D6481 Anemia due to antineoplastic chemotherapy: Secondary | ICD-10-CM

## 2023-09-30 DIAGNOSIS — I10 Essential (primary) hypertension: Secondary | ICD-10-CM | POA: Insufficient documentation

## 2023-09-30 DIAGNOSIS — Z9189 Other specified personal risk factors, not elsewhere classified: Secondary | ICD-10-CM

## 2023-09-30 LAB — RAD ONC ARIA SESSION SUMMARY
Course Elapsed Days: 40
Plan Fractions Treated to Date: 3
Plan Prescribed Dose Per Fraction: 2 Gy
Plan Total Fractions Prescribed: 15
Plan Total Prescribed Dose: 30 Gy
Reference Point Dosage Given to Date: 6 Gy
Reference Point Session Dosage Given: 2 Gy
Session Number: 28

## 2023-09-30 LAB — CBC WITH DIFFERENTIAL (CANCER CENTER ONLY)
Abs Immature Granulocytes: 0.01 10*3/uL (ref 0.00–0.07)
Basophils Absolute: 0 10*3/uL (ref 0.0–0.1)
Basophils Relative: 1 %
Eosinophils Absolute: 0.1 10*3/uL (ref 0.0–0.5)
Eosinophils Relative: 3 %
HCT: 30.1 % — ABNORMAL LOW (ref 39.0–52.0)
Hemoglobin: 10.9 g/dL — ABNORMAL LOW (ref 13.0–17.0)
Immature Granulocytes: 0 %
Lymphocytes Relative: 11 %
Lymphs Abs: 0.4 10*3/uL — ABNORMAL LOW (ref 0.7–4.0)
MCH: 30.8 pg (ref 26.0–34.0)
MCHC: 36.2 g/dL — ABNORMAL HIGH (ref 30.0–36.0)
MCV: 85 fL (ref 80.0–100.0)
Monocytes Absolute: 0.4 10*3/uL (ref 0.1–1.0)
Monocytes Relative: 12 %
Neutro Abs: 2.7 10*3/uL (ref 1.7–7.7)
Neutrophils Relative %: 73 %
Platelet Count: 133 10*3/uL — ABNORMAL LOW (ref 150–400)
RBC: 3.54 MIL/uL — ABNORMAL LOW (ref 4.22–5.81)
RDW: 14.1 % (ref 11.5–15.5)
WBC Count: 3.7 10*3/uL — ABNORMAL LOW (ref 4.0–10.5)
nRBC: 0 % (ref 0.0–0.2)

## 2023-09-30 LAB — CMP (CANCER CENTER ONLY)
ALT: 13 U/L (ref 0–44)
AST: 15 U/L (ref 15–41)
Albumin: 4.2 g/dL (ref 3.5–5.0)
Alkaline Phosphatase: 44 U/L (ref 38–126)
Anion gap: 5 (ref 5–15)
BUN: 15 mg/dL (ref 8–23)
CO2: 29 mmol/L (ref 22–32)
Calcium: 9.4 mg/dL (ref 8.9–10.3)
Chloride: 107 mmol/L (ref 98–111)
Creatinine: 0.79 mg/dL (ref 0.61–1.24)
GFR, Estimated: >60 mL/min (ref >60)
Glucose, Bld: 112 mg/dL — ABNORMAL HIGH (ref 70–99)
Potassium: 4.4 mmol/L (ref 3.5–5.1)
Sodium: 141 mmol/L (ref 135–145)
Total Bilirubin: 1.9 mg/dL — ABNORMAL HIGH (ref 0.0–1.2)
Total Protein: 6.8 g/dL (ref 6.5–8.1)

## 2023-09-30 LAB — SAMPLE TO BLOOD BANK

## 2023-09-30 NOTE — Assessment & Plan Note (Addendum)
 Continue Orgovyx and darolutamide Follow up to complete radiation  Follow up in about 4-5 weeks with labs.

## 2023-09-30 NOTE — Assessment & Plan Note (Addendum)
 Currently on amlodipine , hydrochlorothiazide  and benazepril.  Monitor BP at home. Mostly <140.

## 2023-09-30 NOTE — Assessment & Plan Note (Addendum)
 03/2023 normal bone mineral density study calcium  (1000-1200 mg daily from food and supplements) and vitamin D3 (1000 IU daily) Healthy diet to prevent diabetes Control HTN, HLD. Weight-bearing exercises (30 minutes per day) Limit alcohol consumption and avoid smoking He should continue to see his cardiology team to optimize his cardiac condition while on treatment for active prostate cancer CBC, CMP, T&S next months

## 2023-09-30 NOTE — Assessment & Plan Note (Addendum)
 On atorvastatin No signs of SE Continue monitor for toxicity with ARPI

## 2023-10-01 ENCOUNTER — Ambulatory Visit
Admission: RE | Admit: 2023-10-01 | Discharge: 2023-10-01 | Disposition: A | Discharge status: Home / Self Care | Source: Ambulatory Visit | Attending: Radiation Oncology | Admitting: Radiation Oncology

## 2023-10-01 ENCOUNTER — Other Ambulatory Visit: Payer: Self-pay

## 2023-10-01 ENCOUNTER — Ambulatory Visit

## 2023-10-01 DIAGNOSIS — C7951 Secondary malignant neoplasm of bone: Secondary | ICD-10-CM | POA: Diagnosis not present

## 2023-10-01 DIAGNOSIS — Z191 Hormone sensitive malignancy status: Secondary | ICD-10-CM | POA: Diagnosis not present

## 2023-10-01 DIAGNOSIS — Z510 Encounter for antineoplastic radiation therapy: Secondary | ICD-10-CM | POA: Diagnosis not present

## 2023-10-01 DIAGNOSIS — C61 Malignant neoplasm of prostate: Secondary | ICD-10-CM | POA: Diagnosis not present

## 2023-10-01 LAB — RAD ONC ARIA SESSION SUMMARY
Course Elapsed Days: 41
Plan Fractions Treated to Date: 4
Plan Prescribed Dose Per Fraction: 2 Gy
Plan Total Fractions Prescribed: 15
Plan Total Prescribed Dose: 30 Gy
Reference Point Dosage Given to Date: 8 Gy
Reference Point Session Dosage Given: 2 Gy
Session Number: 29

## 2023-10-01 LAB — PROSTATE-SPECIFIC AG, SERUM (LABCORP): Prostate Specific Ag, Serum: <0.1 ng/mL (ref 0.0–4.0)

## 2023-10-01 LAB — TESTOSTERONE: Testosterone: <3 ng/dL — ABNORMAL LOW (ref 264–916)

## 2023-10-02 ENCOUNTER — Other Ambulatory Visit: Payer: Self-pay

## 2023-10-02 ENCOUNTER — Ambulatory Visit
Admission: RE | Admit: 2023-10-02 | Discharge: 2023-10-02 | Disposition: A | Discharge status: Home / Self Care | Source: Ambulatory Visit | Attending: Radiation Oncology | Admitting: Radiation Oncology

## 2023-10-02 ENCOUNTER — Ambulatory Visit

## 2023-10-02 DIAGNOSIS — C61 Malignant neoplasm of prostate: Secondary | ICD-10-CM | POA: Diagnosis not present

## 2023-10-02 DIAGNOSIS — Z510 Encounter for antineoplastic radiation therapy: Secondary | ICD-10-CM | POA: Diagnosis not present

## 2023-10-02 DIAGNOSIS — C7951 Secondary malignant neoplasm of bone: Secondary | ICD-10-CM | POA: Diagnosis not present

## 2023-10-02 DIAGNOSIS — Z191 Hormone sensitive malignancy status: Secondary | ICD-10-CM | POA: Diagnosis not present

## 2023-10-02 LAB — RAD ONC ARIA SESSION SUMMARY
Course Elapsed Days: 42
Plan Fractions Treated to Date: 5
Plan Prescribed Dose Per Fraction: 2 Gy
Plan Total Fractions Prescribed: 15
Plan Total Prescribed Dose: 30 Gy
Reference Point Dosage Given to Date: 10 Gy
Reference Point Session Dosage Given: 2 Gy
Session Number: 30

## 2023-10-03 ENCOUNTER — Ambulatory Visit

## 2023-10-03 ENCOUNTER — Ambulatory Visit
Admission: RE | Admit: 2023-10-03 | Discharge: 2023-10-03 | Disposition: A | Discharge status: Home / Self Care | Source: Ambulatory Visit | Attending: Radiation Oncology | Admitting: Radiation Oncology

## 2023-10-03 ENCOUNTER — Other Ambulatory Visit: Payer: Self-pay

## 2023-10-03 DIAGNOSIS — Z510 Encounter for antineoplastic radiation therapy: Secondary | ICD-10-CM | POA: Diagnosis not present

## 2023-10-03 DIAGNOSIS — C61 Malignant neoplasm of prostate: Secondary | ICD-10-CM | POA: Diagnosis not present

## 2023-10-03 DIAGNOSIS — C7951 Secondary malignant neoplasm of bone: Secondary | ICD-10-CM | POA: Diagnosis not present

## 2023-10-03 DIAGNOSIS — Z191 Hormone sensitive malignancy status: Secondary | ICD-10-CM | POA: Diagnosis not present

## 2023-10-03 LAB — RAD ONC ARIA SESSION SUMMARY
Course Elapsed Days: 43
Plan Fractions Treated to Date: 6
Plan Prescribed Dose Per Fraction: 2 Gy
Plan Total Fractions Prescribed: 15
Plan Total Prescribed Dose: 30 Gy
Reference Point Dosage Given to Date: 12 Gy
Reference Point Session Dosage Given: 2 Gy
Session Number: 31

## 2023-10-04 ENCOUNTER — Other Ambulatory Visit: Payer: Self-pay

## 2023-10-04 ENCOUNTER — Ambulatory Visit
Admission: RE | Admit: 2023-10-04 | Discharge: 2023-10-04 | Disposition: A | Discharge status: Home / Self Care | Source: Ambulatory Visit | Attending: Radiation Oncology | Admitting: Radiation Oncology

## 2023-10-04 ENCOUNTER — Ambulatory Visit

## 2023-10-04 DIAGNOSIS — Z191 Hormone sensitive malignancy status: Secondary | ICD-10-CM | POA: Diagnosis not present

## 2023-10-04 DIAGNOSIS — C7951 Secondary malignant neoplasm of bone: Secondary | ICD-10-CM | POA: Diagnosis not present

## 2023-10-04 DIAGNOSIS — C61 Malignant neoplasm of prostate: Secondary | ICD-10-CM | POA: Diagnosis not present

## 2023-10-04 DIAGNOSIS — Z510 Encounter for antineoplastic radiation therapy: Secondary | ICD-10-CM | POA: Diagnosis not present

## 2023-10-04 LAB — RAD ONC ARIA SESSION SUMMARY
Course Elapsed Days: 44
Plan Fractions Treated to Date: 7
Plan Prescribed Dose Per Fraction: 2 Gy
Plan Total Fractions Prescribed: 15
Plan Total Prescribed Dose: 30 Gy
Reference Point Dosage Given to Date: 14 Gy
Reference Point Session Dosage Given: 2 Gy
Session Number: 32

## 2023-10-08 ENCOUNTER — Ambulatory Visit

## 2023-10-08 ENCOUNTER — Ambulatory Visit
Admission: RE | Admit: 2023-10-08 | Discharge: 2023-10-08 | Disposition: A | Discharge status: Home / Self Care | Source: Ambulatory Visit | Attending: Radiation Oncology

## 2023-10-08 ENCOUNTER — Other Ambulatory Visit: Payer: Self-pay

## 2023-10-08 DIAGNOSIS — Z191 Hormone sensitive malignancy status: Secondary | ICD-10-CM | POA: Diagnosis not present

## 2023-10-08 DIAGNOSIS — C7951 Secondary malignant neoplasm of bone: Secondary | ICD-10-CM | POA: Diagnosis not present

## 2023-10-08 DIAGNOSIS — Z510 Encounter for antineoplastic radiation therapy: Secondary | ICD-10-CM | POA: Diagnosis not present

## 2023-10-08 DIAGNOSIS — C61 Malignant neoplasm of prostate: Secondary | ICD-10-CM | POA: Diagnosis not present

## 2023-10-08 LAB — RAD ONC ARIA SESSION SUMMARY
Course Elapsed Days: 48
Plan Fractions Treated to Date: 8
Plan Prescribed Dose Per Fraction: 2 Gy
Plan Total Fractions Prescribed: 15
Plan Total Prescribed Dose: 30 Gy
Reference Point Dosage Given to Date: 16 Gy
Reference Point Session Dosage Given: 2 Gy
Session Number: 33

## 2023-10-09 ENCOUNTER — Ambulatory Visit
Admission: RE | Admit: 2023-10-09 | Discharge: 2023-10-09 | Disposition: A | Discharge status: Home / Self Care | Source: Ambulatory Visit | Attending: Radiation Oncology

## 2023-10-09 ENCOUNTER — Ambulatory Visit

## 2023-10-09 ENCOUNTER — Other Ambulatory Visit: Payer: Self-pay

## 2023-10-09 DIAGNOSIS — C7951 Secondary malignant neoplasm of bone: Secondary | ICD-10-CM | POA: Diagnosis not present

## 2023-10-09 DIAGNOSIS — C61 Malignant neoplasm of prostate: Secondary | ICD-10-CM | POA: Diagnosis not present

## 2023-10-09 DIAGNOSIS — Z191 Hormone sensitive malignancy status: Secondary | ICD-10-CM | POA: Diagnosis not present

## 2023-10-09 DIAGNOSIS — Z510 Encounter for antineoplastic radiation therapy: Secondary | ICD-10-CM | POA: Diagnosis not present

## 2023-10-09 LAB — RAD ONC ARIA SESSION SUMMARY
Course Elapsed Days: 49
Plan Fractions Treated to Date: 9
Plan Prescribed Dose Per Fraction: 2 Gy
Plan Total Fractions Prescribed: 15
Plan Total Prescribed Dose: 30 Gy
Reference Point Dosage Given to Date: 18 Gy
Reference Point Session Dosage Given: 2 Gy
Session Number: 34

## 2023-10-10 ENCOUNTER — Other Ambulatory Visit: Payer: Self-pay

## 2023-10-10 ENCOUNTER — Ambulatory Visit
Admission: RE | Admit: 2023-10-10 | Discharge: 2023-10-10 | Disposition: A | Discharge status: Home / Self Care | Source: Ambulatory Visit | Attending: Radiation Oncology

## 2023-10-10 ENCOUNTER — Ambulatory Visit

## 2023-10-10 DIAGNOSIS — C61 Malignant neoplasm of prostate: Secondary | ICD-10-CM | POA: Diagnosis not present

## 2023-10-10 DIAGNOSIS — Z510 Encounter for antineoplastic radiation therapy: Secondary | ICD-10-CM | POA: Diagnosis not present

## 2023-10-10 DIAGNOSIS — C7951 Secondary malignant neoplasm of bone: Secondary | ICD-10-CM | POA: Diagnosis not present

## 2023-10-10 DIAGNOSIS — Z191 Hormone sensitive malignancy status: Secondary | ICD-10-CM | POA: Diagnosis not present

## 2023-10-10 LAB — RAD ONC ARIA SESSION SUMMARY
Course Elapsed Days: 50
Plan Fractions Treated to Date: 10
Plan Prescribed Dose Per Fraction: 2 Gy
Plan Total Fractions Prescribed: 15
Plan Total Prescribed Dose: 30 Gy
Reference Point Dosage Given to Date: 20 Gy
Reference Point Session Dosage Given: 2 Gy
Session Number: 35

## 2023-10-11 ENCOUNTER — Ambulatory Visit

## 2023-10-11 ENCOUNTER — Ambulatory Visit
Admission: RE | Admit: 2023-10-11 | Discharge: 2023-10-11 | Disposition: A | Discharge status: Home / Self Care | Source: Ambulatory Visit | Attending: Radiation Oncology | Admitting: Radiation Oncology

## 2023-10-11 ENCOUNTER — Other Ambulatory Visit: Payer: Self-pay

## 2023-10-11 DIAGNOSIS — Z510 Encounter for antineoplastic radiation therapy: Secondary | ICD-10-CM | POA: Diagnosis not present

## 2023-10-11 DIAGNOSIS — C61 Malignant neoplasm of prostate: Secondary | ICD-10-CM | POA: Diagnosis not present

## 2023-10-11 DIAGNOSIS — C7951 Secondary malignant neoplasm of bone: Secondary | ICD-10-CM | POA: Diagnosis not present

## 2023-10-11 DIAGNOSIS — Z191 Hormone sensitive malignancy status: Secondary | ICD-10-CM | POA: Diagnosis not present

## 2023-10-11 LAB — RAD ONC ARIA SESSION SUMMARY
Course Elapsed Days: 51
Plan Fractions Treated to Date: 11
Plan Prescribed Dose Per Fraction: 2 Gy
Plan Total Fractions Prescribed: 15
Plan Total Prescribed Dose: 30 Gy
Reference Point Dosage Given to Date: 22 Gy
Reference Point Session Dosage Given: 2 Gy
Session Number: 36

## 2023-10-14 ENCOUNTER — Other Ambulatory Visit: Payer: Self-pay

## 2023-10-14 ENCOUNTER — Ambulatory Visit

## 2023-10-14 ENCOUNTER — Encounter: Payer: Self-pay | Admitting: Internal Medicine

## 2023-10-14 ENCOUNTER — Ambulatory Visit
Admission: RE | Admit: 2023-10-14 | Discharge: 2023-10-14 | Disposition: A | Discharge status: Home / Self Care | Source: Ambulatory Visit | Attending: Radiation Oncology | Admitting: Radiation Oncology

## 2023-10-14 DIAGNOSIS — C7951 Secondary malignant neoplasm of bone: Secondary | ICD-10-CM | POA: Diagnosis not present

## 2023-10-14 DIAGNOSIS — C778 Secondary and unspecified malignant neoplasm of lymph nodes of multiple regions: Secondary | ICD-10-CM | POA: Diagnosis present

## 2023-10-14 DIAGNOSIS — Z510 Encounter for antineoplastic radiation therapy: Secondary | ICD-10-CM | POA: Diagnosis not present

## 2023-10-14 DIAGNOSIS — Z191 Hormone sensitive malignancy status: Secondary | ICD-10-CM | POA: Diagnosis not present

## 2023-10-14 DIAGNOSIS — C61 Malignant neoplasm of prostate: Secondary | ICD-10-CM | POA: Insufficient documentation

## 2023-10-14 LAB — RAD ONC ARIA SESSION SUMMARY
Course Elapsed Days: 54
Plan Fractions Treated to Date: 12
Plan Prescribed Dose Per Fraction: 2 Gy
Plan Total Fractions Prescribed: 15
Plan Total Prescribed Dose: 30 Gy
Reference Point Dosage Given to Date: 24 Gy
Reference Point Session Dosage Given: 2 Gy
Session Number: 37

## 2023-10-14 NOTE — Patient Instructions (Addendum)
      Blood work was ordered.       Medications changes include :   None     Return in about 1 year (around 10/14/2024) for follow up.

## 2023-10-14 NOTE — Progress Notes (Signed)
 Subjective:    Patient ID: Nicholas Paul, male    DOB: [birth year], 73 y.o.   MRN: [id number]     HPI Nicholas Paul is here for follow up of his chronic medical problems.  Metastatic prostate ca - following with oncology.  Has mets to regional and nonregional lymph nodes and 4 sites of bone metastases.  On docetaxel .  Doing radiation treatments.  He is getting darolutamide and relugolix from [facility] monthly.   Completes radiation this Thursday.   Playing golf.  Appetite good.  Goes to [employer] [place]/ week. Still flying.  Gets tired more easily.      Medications and allergies reviewed with patient and updated if appropriate.  Current Outpatient Medications on File Prior to Visit  Medication Sig Dispense Refill   amLODipine  (NORVASC ) 5 MG tablet TAKE 1 TABLET (5 MG TOTAL) BY MOUTH DAILY. 90 tablet 3   Ascorbic Acid (VITAMIN C PO) Take 1 tablet by mouth daily.     aspirin  EC 81 MG tablet Take 81 mg by mouth in the morning.     atorvastatin  (LIPITOR) 10 MG tablet TAKE 1 TABLET BY MOUTH EVERY DAY 90 tablet 3   B Complex-C (B-COMPLEX WITH VITAMIN C) tablet Take 1 tablet by mouth daily.     benazepril (LOTENSIN) 40 MG tablet TAKE 1 TABLET BY MOUTH EVERY DAY (Patient taking differently: Take 40 mg by mouth every evening.) 90 tablet 2   Cholecalciferol (VITAMIN D ) 125 MCG (5000 UT) CAPS Take 125 mcg by mouth daily.      Coenzyme Q10 (CO Q 10 PO) Take 1 capsule by mouth daily.     darolutamide (NUBEQA) 300 MG tablet Take 600 mg by mouth 2 (two) times daily with a meal.     famotidine (PEPCID) 10 MG tablet Take 10 mg by mouth as needed for heartburn or indigestion.     hydrochlorothiazide  (MICROZIDE ) 12.5 MG capsule TAKE 1 CAPSULE BY MOUTH EVERY DAY 90 capsule 3   Naproxen  Sod-diphenhydrAMINE (ALEVE  PM) 220-25 MG TABS Take 1 tablet by mouth at bedtime as needed (sleep/pain.).     Omega-3 Fatty Acids (OMEGA 3 PO) Take 1 capsule by mouth daily.      psyllium (METAMUCIL) 58.6 % packet Take 2  packets by mouth 2 (two) times daily.      relugolix (ORGOVYX) 120 MG tablet Take 120 mg by mouth in the morning.     No current facility-administered medications on file prior to visit.     Review of Systems  Constitutional:  Positive for fatigue. Negative for fever.  Respiratory:  Negative for cough, shortness of breath and wheezing.   Cardiovascular:  Negative for chest pain, palpitations and leg swelling.  Gastrointestinal:  Negative for abdominal pain.       Occ gerd  Neurological:  Negative for dizziness, light-headedness and headaches.  Psychiatric/Behavioral:  Negative for dysphoric mood and sleep disturbance. The patient is not nervous/anxious.        Objective:   Vitals:   10/15/23 1348  BP: 108/60  Pulse: (!) 55  Temp: 98 F (36.7 C)  SpO2: 98%   BP Readings from Last 3 Encounters:  10/15/23 108/60  09/30/23 135/71  08/09/23 (!) 127/96   Wt Readings from Last 3 Encounters:  10/15/23 173 lb (78.5 kg)  09/30/23 172 lb (78 kg)  08/09/23 175 lb (79.4 kg)   Body mass index is 25.55 kg/m.    Physical Exam Constitutional:      General:  He is not in acute distress.    Appearance: Normal appearance. He is not ill-appearing.  HENT:     Head: Normocephalic and atraumatic.  Eyes:     Conjunctiva/sclera: Conjunctivae normal.  Cardiovascular:     Rate and Rhythm: Normal rate and regular rhythm.     Heart sounds: Normal heart sounds.  Pulmonary:     Effort: Pulmonary effort is normal. No respiratory distress.     Breath sounds: Normal breath sounds. No wheezing or rales.  Musculoskeletal:     Right lower leg: No edema.     Left lower leg: No edema.  Skin:    General: Skin is warm and dry.     Findings: No rash.  Neurological:     Mental Status: He is alert. Mental status is at baseline.  Psychiatric:        Mood and Affect: Mood normal.        Lab Results  Component Value Date   WBC 3.7 (L) 09/30/2023   HGB 10.9 (L) 09/30/2023   HCT 30.1 (L)  09/30/2023   PLT 133 (L) 09/30/2023   GLUCOSE 112 (H) 09/30/2023   CHOL 140 11/05/2022   TRIG 55.0 11/05/2022   HDL 64.70 11/05/2022   LDLCALC 64 11/05/2022   ALT 13 09/30/2023   AST 15 09/30/2023   NA 141 09/30/2023   K 4.4 09/30/2023   CL 107 09/30/2023   CREATININE 0.79 09/30/2023   BUN 15 09/30/2023   CO2 29 09/30/2023   TSH 0.69 11/06/2018   PSA 3.04 11/06/2018   HGBA1C 5.5 11/05/2022     Assessment & Plan:    See Problem List for Assessment and Plan of chronic medical problems.

## 2023-10-15 ENCOUNTER — Other Ambulatory Visit: Payer: Self-pay

## 2023-10-15 ENCOUNTER — Ambulatory Visit (INDEPENDENT_AMBULATORY_CARE_PROVIDER_SITE_OTHER): Admitting: Internal Medicine

## 2023-10-15 ENCOUNTER — Ambulatory Visit
Admission: RE | Admit: 2023-10-15 | Discharge: 2023-10-15 | Disposition: A | Discharge status: Home / Self Care | Source: Ambulatory Visit | Attending: Radiation Oncology | Admitting: Radiation Oncology

## 2023-10-15 ENCOUNTER — Ambulatory Visit

## 2023-10-15 ENCOUNTER — Ambulatory Visit: Payer: Self-pay | Admitting: Internal Medicine

## 2023-10-15 VITALS — BP 108/60 | HR 55 | Temp 98.0°F | Ht 69.0 in | Wt 173.0 lb

## 2023-10-15 DIAGNOSIS — I10 Essential (primary) hypertension: Secondary | ICD-10-CM

## 2023-10-15 DIAGNOSIS — Z191 Hormone sensitive malignancy status: Secondary | ICD-10-CM | POA: Diagnosis not present

## 2023-10-15 DIAGNOSIS — C7951 Secondary malignant neoplasm of bone: Secondary | ICD-10-CM | POA: Diagnosis not present

## 2023-10-15 DIAGNOSIS — R7303 Prediabetes: Secondary | ICD-10-CM | POA: Diagnosis not present

## 2023-10-15 DIAGNOSIS — E7849 Other hyperlipidemia: Secondary | ICD-10-CM

## 2023-10-15 DIAGNOSIS — C61 Malignant neoplasm of prostate: Secondary | ICD-10-CM | POA: Diagnosis not present

## 2023-10-15 DIAGNOSIS — Z510 Encounter for antineoplastic radiation therapy: Secondary | ICD-10-CM | POA: Diagnosis not present

## 2023-10-15 DIAGNOSIS — C778 Secondary and unspecified malignant neoplasm of lymph nodes of multiple regions: Secondary | ICD-10-CM | POA: Diagnosis not present

## 2023-10-15 LAB — LIPID PANEL
Cholesterol: 131 mg/dL (ref 0–200)
HDL: 61.1 mg/dL (ref >39.00)
LDL Cholesterol: 40 mg/dL (ref 0–99)
NonHDL: 69.46
Total CHOL/HDL Ratio: 2
Triglycerides: 147 mg/dL (ref 0.0–149.0)
VLDL: 29.4 mg/dL (ref 0.0–40.0)

## 2023-10-15 LAB — RAD ONC ARIA SESSION SUMMARY
Course Elapsed Days: 55
Plan Fractions Treated to Date: 13
Plan Prescribed Dose Per Fraction: 2 Gy
Plan Total Fractions Prescribed: 15
Plan Total Prescribed Dose: 30 Gy
Reference Point Dosage Given to Date: 26 Gy
Reference Point Session Dosage Given: 2 Gy
Session Number: 38

## 2023-10-15 LAB — TSH: TSH: 0.61 u[IU]/mL (ref 0.35–5.50)

## 2023-10-15 LAB — HEMOGLOBIN A1C: Hgb A1c MFr Bld: 5.7 % (ref 4.6–6.5)

## 2023-10-15 NOTE — Assessment & Plan Note (Signed)
Chronic Check a1c Low sugar / carb diet Stressed regular exercise  

## 2023-10-15 NOTE — Assessment & Plan Note (Signed)
 Chronic Check lipid panel, TSH Continue atorvastatin  10 mg daily Continue regular exercise and healthy diet

## 2023-10-15 NOTE — Assessment & Plan Note (Signed)
 Chronic Blood pressure well controlled Recent CMP reviewed Continue amlodipine  5 mg daily, benazepril 40 mg daily, HCTZ 12.5 mg daily

## 2023-10-16 ENCOUNTER — Other Ambulatory Visit: Payer: Self-pay

## 2023-10-16 ENCOUNTER — Ambulatory Visit
Admission: RE | Admit: 2023-10-16 | Discharge: 2023-10-16 | Disposition: A | Discharge status: Home / Self Care | Source: Ambulatory Visit | Attending: Radiation Oncology | Admitting: Radiation Oncology

## 2023-10-16 ENCOUNTER — Ambulatory Visit

## 2023-10-16 DIAGNOSIS — Z510 Encounter for antineoplastic radiation therapy: Secondary | ICD-10-CM | POA: Diagnosis not present

## 2023-10-16 DIAGNOSIS — Z191 Hormone sensitive malignancy status: Secondary | ICD-10-CM | POA: Diagnosis not present

## 2023-10-16 DIAGNOSIS — C778 Secondary and unspecified malignant neoplasm of lymph nodes of multiple regions: Secondary | ICD-10-CM | POA: Diagnosis not present

## 2023-10-16 DIAGNOSIS — C61 Malignant neoplasm of prostate: Secondary | ICD-10-CM | POA: Diagnosis not present

## 2023-10-16 DIAGNOSIS — C7951 Secondary malignant neoplasm of bone: Secondary | ICD-10-CM | POA: Diagnosis not present

## 2023-10-16 LAB — RAD ONC ARIA SESSION SUMMARY
Course Elapsed Days: 56
Plan Fractions Treated to Date: 14
Plan Prescribed Dose Per Fraction: 2 Gy
Plan Total Fractions Prescribed: 15
Plan Total Prescribed Dose: 30 Gy
Reference Point Dosage Given to Date: 28 Gy
Reference Point Session Dosage Given: 2 Gy
Session Number: 39

## 2023-10-17 ENCOUNTER — Other Ambulatory Visit: Payer: Self-pay

## 2023-10-17 ENCOUNTER — Ambulatory Visit
Admission: RE | Admit: 2023-10-17 | Discharge: 2023-10-17 | Disposition: A | Discharge status: Home / Self Care | Source: Ambulatory Visit | Attending: Radiation Oncology | Admitting: Radiation Oncology

## 2023-10-17 DIAGNOSIS — C778 Secondary and unspecified malignant neoplasm of lymph nodes of multiple regions: Secondary | ICD-10-CM | POA: Diagnosis not present

## 2023-10-17 DIAGNOSIS — C61 Malignant neoplasm of prostate: Secondary | ICD-10-CM

## 2023-10-17 DIAGNOSIS — C7951 Secondary malignant neoplasm of bone: Secondary | ICD-10-CM | POA: Diagnosis not present

## 2023-10-17 DIAGNOSIS — Z510 Encounter for antineoplastic radiation therapy: Secondary | ICD-10-CM | POA: Diagnosis not present

## 2023-10-17 DIAGNOSIS — Z191 Hormone sensitive malignancy status: Secondary | ICD-10-CM | POA: Diagnosis not present

## 2023-10-17 LAB — RAD ONC ARIA SESSION SUMMARY
Course Elapsed Days: 57
Plan Fractions Treated to Date: 15
Plan Prescribed Dose Per Fraction: 2 Gy
Plan Total Fractions Prescribed: 15
Plan Total Prescribed Dose: 30 Gy
Reference Point Dosage Given to Date: 30 Gy
Reference Point Session Dosage Given: 2 Gy
Session Number: 40

## 2023-10-18 NOTE — Radiation Completion Notes (Addendum)
  Radiation Oncology         [phone] ________________________________  Name: Nicholas Paul MRN: [id number]  Date: 10/17/2023  DOB: 01-Aug-1950  Referring Physician: DONNICE BROOKS, M.D. Date of Service: 2023-10-18 Radiation Oncologist: Adina Barge, M.D. [facility]     RADIATION ONCOLOGY END OF TREATMENT NOTE     Diagnosis: 73 y.o. man with oligometastatic, Gleason 4+5, adenocarcinoma of the prostate involving abdominopelvic lymph nodes and 3 bony lesions with a pretreatment PSA of 13.7.   Intent: Curative     ==========DELIVERED PLANS==========  First Treatment Date: 2023-08-21 Last Treatment Date: 2023-10-17   Plan Name: Prostate_Pelv Site: Prostate Technique: IMRT Mode: Photon Dose Per Fraction: 1.8 Gy Prescribed Dose (Delivered / Prescribed): 45 Gy / 45 Gy Prescribed Fxs (Delivered / Prescribed): 25 / 25   Plan Name: Prostate_Bst Site: Prostate Technique: IMRT Mode: Photon Dose Per Fraction: 2 Gy Prescribed Dose (Delivered / Prescribed): 30 Gy / 30 Gy Prescribed Fxs (Delivered / Prescribed): 15 / 15   Plan Name: Chest_R_SBRT Site: Thorax Technique: SBRT/SRT-IMRT Mode: Photon Dose Per Fraction: 8 Gy Prescribed Dose (Delivered / Prescribed): 40 Gy / 40 Gy Prescribed Fxs (Delivered / Prescribed): 5 / 5     ==========ON TREATMENT VISIT DATES========== 2023-08-23, 2023-08-30, 2023-09-06, 2023-09-09, 2023-09-11, 2023-09-13, 2023-09-13, 2023-09-17, 2023-09-19, 2023-09-20, 2023-10-01, 2023-10-04, 2023-10-11     See weekly On Treatment Notes in Epic for details in the Media tab (listed as Progress notes on the On Treatment Visit Dates listed above).  He tolerated the daily treatments well with only mild increased LUTS and modest fatigue.  The patient will receive a call in about one month from the radiation oncology department. He will continue follow up with his medical oncologist, Dr. Tina, and the [facility] at [facility] as  well.  ------------------------------------------------   DONNICE Barge, MD [facility]  Radiation Oncology Direct Dial: [phone]  Fax: [phone] [email or web address]  Skype  LinkedIn

## 2023-11-04 ENCOUNTER — Ambulatory Visit
Admission: RE | Admit: 2023-11-04 | Discharge: 2023-11-04 | Disposition: A | Discharge status: Home / Self Care | Source: Ambulatory Visit | Attending: Family Medicine | Admitting: Family Medicine

## 2023-11-04 ENCOUNTER — Telehealth: Payer: Self-pay | Admitting: *Deleted

## 2023-11-04 ENCOUNTER — Other Ambulatory Visit: Payer: Self-pay

## 2023-11-04 VITALS — BP 126/74 | HR 95 | Temp 98.8°F | Resp 16

## 2023-11-04 DIAGNOSIS — Z20822 Contact with and (suspected) exposure to covid-19: Secondary | ICD-10-CM

## 2023-11-04 DIAGNOSIS — U071 COVID-19: Secondary | ICD-10-CM

## 2023-11-04 LAB — POC SARS CORONAVIRUS 2 AG -  ED: SARS Coronavirus 2 Ag: POSITIVE — AB

## 2023-11-04 MED ORDER — MOLNUPIRAVIR EUA 200MG CAPSULE: 4.0000 | ORAL_CAPSULE | Freq: Two times a day (BID) | ORAL | 0 refills | Status: AC

## 2023-11-04 MED ORDER — PROMETHAZINE-DM 6.25-15 MG/5ML PO SYRP: 5.0000 mL | ORAL_SOLUTION | Freq: Three times a day (TID) | ORAL | 0 refills | Status: DC | PRN

## 2023-11-04 NOTE — Discharge Instructions (Addendum)
 You have tested positive for COVID-19.  You may take molnupiravir which is an antiviral medication to help reduce complication risk of COVID as well as severity of symptoms.  Start Promethazine DM as needed for your cough.  Please note this medication will make you drowsy.  Do not drink alcohol or drive while on this medication.  Lots of rest and fluids.  Please follow-up with your PCP in 2 days for recheck.  Please go to the ER if you develop any worsening symptoms.  Hope you feel better soon!

## 2023-11-04 NOTE — ED Provider Notes (Signed)
 [facility]    CSN: [id number] Arrival date & time: 11/04/23  9048      History   Chief Complaint Chief Complaint  Patient presents with   Headache    Congestion - Entered by patient    HPI Nicholas Paul is a 73 y.o. male  presents for evaluation of URI symptoms for 1 days.  Patient has a complex medical history including BPH, metastatic prostate cancer currently on immunotherapy, CAD, GERD, hyperlipidemia, hypertension, IBS. patient reports associated symptoms of cough, congestion, headache. Denies N/V/D, fevers, sore throat, ear pain, body aches, shortness of breath. Patient does not have a hx of asthma. Patient is not an active smoker.   Reports wife has COVID.  Pt has taken tylenol  OTC for symptoms. Pt has no other concerns at this time.    Headache Associated symptoms: congestion and cough     Past Medical History:  Diagnosis Date   BPH (benign prostatic hyperplasia)    urologist--- dr nieves   Cancer [facility])    Coronary artery disease cardiologist--- katheryn gerhardt NP   last CTA 02-17-2018  nonobstructive proximal and mid LAD, calcium  score 15;   nuclear stress test 06-09-2003 in epic,  no ischemia w/ ef 59%   Family history of bladder cancer    Family history of prostate cancer    GERD (gastroesophageal reflux disease)    H/O: rheumatic fever    noted after tonsillectomy   Head injury, closed, initial encounter 03/26/2019   Hyperlipidemia    Hypertension    followed by cardiology   IBS (irritable bowel syndrome)    Dr Avram   Internal hemorrhoids    Pulmonary nodule    4 mm RML nodule on CT scan in 12/12. Needs follow up in one year.       Patient Active Problem List   Diagnosis Date Noted   Malignant neoplasm of prostate metastatic to lymph nodes of multiple sites (HCC) 07/01/2023   Port-A-Cath in place 04/18/2023   Anemia due to antineoplastic chemotherapy 03/29/2023   Genetic testing 03/04/2023   Family history of bladder cancer     Family history of prostate cancer    Prostate cancer metastatic to bone (HCC) 01/17/2023   At risk for side effect of medication 01/17/2023   Leg cramping 11/05/2022   COVID 07/24/2021   Herpes zoster without complication 03/11/2017   Prediabetes 09/03/2015   Gilbert syndrome 07/30/2011   Pulmonary nodule 05/21/2011   Pain in joint 07/14/2010   Irritable bowel syndrome 04/04/2010   Actinic keratosis 10/20/2009   History of colonic polyps 10/20/2009   HEMATURIA, MICROSCOPIC, HX OF 10/20/2009   Hyperlipidemia 07/31/2007   Essential hypertension 07/31/2007    Past Surgical History:  Procedure Laterality Date   COLONOSCOPY W/ POLYPECTOMY  2006   internal hemorrhoids 2011   GOLD SEED IMPLANT N/A 08/09/2023   Procedure: INSERTION, GOLD SEEDS;  Surgeon: Devere Lonni Righter, MD;  Location: [facility] OR;  Service: Urology;  Laterality: N/A;   IR IMAGING GUIDED PORT INSERTION  03/05/2023   IR REMOVAL TUN ACCESS W/ PORT W/O FL MOD SED  07/15/2023   PROSTATE BIOPSY     SHOULDER ARTHROSCOPY WITH OPEN ROTATOR CUFF REPAIR Right 2022   SPACE OAR INSTILLATION N/A 08/09/2023   Procedure: INJECTION, HYDROGEL SPACER;  Surgeon: Devere Lonni Righter, MD;  Location: [facility] OR;  Service: Urology;  Laterality: N/A;  SPACE OAR   THULIUM LASER TURP (TRANSURETHRAL RESECTION OF PROSTATE) N/A 07/14/2019   Procedure: JULIE LASER  TURP (TRANSURETHRAL RESECTION OF PROSTATE);  Surgeon: Nieves Cough, MD;  Location: [facility];  Service: Urology;  Laterality: N/A;   TONSILLECTOMY AND ADENOIDECTOMY         Home Medications    Prior to Admission medications   Medication Sig Start Date End Date Taking? Authorizing Provider  molnupiravir EUA (LAGEVRIO) 200 mg CAPS capsule Take 4 capsules (800 mg total) by mouth 2 (two) times daily for 5 days. 11/04/23 11/09/23 Yes Shafin Pollio, Jodi R, NP  promethazine-dextromethorphan (PROMETHAZINE-DM) 6.25-15 MG/5ML syrup Take 5 mLs by mouth 3 (three) times daily as  needed for cough. 11/04/23  Yes Aaleah Hirsch, Jodi R, NP  amLODipine  (NORVASC ) 5 MG tablet TAKE 1 TABLET (5 MG TOTAL) BY MOUTH DAILY. 04/17/23   Daneen Damien BROCKS, NP  Ascorbic Acid (VITAMIN C PO) Take 1 tablet by mouth daily.    [provider]  aspirin  EC 81 MG tablet Take 81 mg by mouth in the morning.    [provider]  atorvastatin  (LIPITOR) 10 MG tablet TAKE 1 TABLET BY MOUTH EVERY DAY 03/04/23   Pietro Redell RAMAN, MD  B Complex-C (B-COMPLEX WITH VITAMIN C) tablet Take 1 tablet by mouth daily.    [provider]  benazepril (LOTENSIN) 40 MG tablet TAKE 1 TABLET BY MOUTH EVERY DAY Patient taking differently: Take 40 mg by mouth every evening. 06/13/23   Pietro Redell RAMAN, MD  Cholecalciferol (VITAMIN D ) 125 MCG (5000 UT) CAPS Take 125 mcg by mouth daily.  08/25/18   [provider]  Coenzyme Q10 (CO Q 10 PO) Take 1 capsule by mouth daily.    [provider]  darolutamide (NUBEQA) 300 MG tablet Take 600 mg by mouth 2 (two) times daily with a meal.    [provider]  famotidine (PEPCID) 10 MG tablet Take 10 mg by mouth as needed for heartburn or indigestion.    [provider]  hydrochlorothiazide  (MICROZIDE ) 12.5 MG capsule TAKE 1 CAPSULE BY MOUTH EVERY DAY 05/31/23   Pietro Redell RAMAN, MD  Naproxen  Sod-diphenhydrAMINE (ALEVE  PM) 220-25 MG TABS Take 1 tablet by mouth at bedtime as needed (sleep/pain.).    [provider]  Omega-3 Fatty Acids (OMEGA 3 PO) Take 1 capsule by mouth daily.     [provider]  psyllium (METAMUCIL) 58.6 % packet Take 2 packets by mouth 2 (two) times daily.     [provider]  relugolix (ORGOVYX) 120 MG tablet Take 120 mg by mouth in the morning.    [provider]    Family History Family History  Problem Relation Age of Onset   Hyperlipidemia Mother    Hypertension Mother    Bladder Cancer Father 86       heavy smoker   Bladder Cancer Brother        dx. 30s, non-smoker    Hypertension Brother    Brain cancer Maternal Uncle 37       glioblastoma   Bladder Cancer Paternal Aunt    Prostate cancer Paternal Grandfather        d. 62s   Prostate cancer Cousin        mat first cousin   Stroke Neg Hx    Diabetes Neg Hx    Heart attack Neg Hx    Colon cancer Neg Hx    Colon polyps Neg Hx    Esophageal cancer Neg Hx    Rectal cancer Neg Hx    Stomach cancer Neg Hx  Social History Social History   Tobacco Use   Smoking status: Never    Passive exposure: Never   Smokeless tobacco: Never  Vaping Use   Vaping status: Never Used  Substance Use Topics   Alcohol use: Yes    Alcohol/week: 5.0 standard drinks of alcohol    Types: 5 Glasses of wine per week   Drug use: Never     Allergies   Doxazosin mesylate and Crestor  [rosuvastatin ]   Review of Systems Review of Systems  HENT:  Positive for congestion.   Respiratory:  Positive for cough.   Neurological:  Positive for headaches.     Physical Exam Triage Vital Signs ED Triage Vitals  Encounter Vitals Group     BP 11/04/23 1001 126/74     Girls Systolic BP Percentile --      Girls Diastolic BP Percentile --      Boys Systolic BP Percentile --      Boys Diastolic BP Percentile --      Pulse Rate 11/04/23 1001 95     Resp 11/04/23 1001 16     Temp 11/04/23 1001 98.8 F (37.1 C)     Temp Source 11/04/23 1001 Oral     SpO2 11/04/23 1001 98 %     Weight --      Height --      Head Circumference --      Peak Flow --      Pain Score 11/04/23 0957 0     Pain Loc --      Pain Education --      Exclude from Growth Chart --    No data found.  Updated Vital Signs BP 126/74   Pulse 95   Temp 98.8 F (37.1 C) (Oral)   Resp 16   SpO2 98%   Visual Acuity Right Eye Distance:   Left Eye Distance:   Bilateral Distance:    Right Eye Near:   Left Eye Near:    Bilateral Near:     Physical Exam Vitals and nursing note reviewed.  Constitutional:      General: He is not in acute  distress.    Appearance: Normal appearance. He is not ill-appearing or toxic-appearing.  HENT:     Head: Normocephalic and atraumatic.     Right Ear: Tympanic membrane and ear canal normal.     Left Ear: Tympanic membrane and ear canal normal.     Nose: Congestion present.     Mouth/Throat:     Mouth: Mucous membranes are moist.     Pharynx: No posterior oropharyngeal erythema.   Eyes:     Pupils: Pupils are equal, round, and reactive to light.    Cardiovascular:     Rate and Rhythm: Normal rate and regular rhythm.     Heart sounds: Normal heart sounds.  Pulmonary:     Effort: Pulmonary effort is normal.     Breath sounds: Normal breath sounds. No wheezing or rhonchi.   Musculoskeletal:     Cervical back: Normal range of motion and neck supple.  Lymphadenopathy:     Cervical: No cervical adenopathy.   Skin:    General: Skin is warm and dry.   Neurological:     General: No focal deficit present.     Mental Status: He is alert and oriented to person, place, and time.   Psychiatric:        Mood and Affect: Mood normal.        Behavior:  Behavior normal.      UC Treatments / Results  Labs (all labs ordered are listed, but only abnormal results are displayed) Labs Reviewed  POC SARS CORONAVIRUS 2 AG -  ED - Abnormal; Notable for the following components:      Result Value   SARS Coronavirus 2 Ag Positive (*)    All other components within normal limits   CMP (Cancer Center only) Order: [id number]  Status: Final result     Next appt: 11/05/2023 at 08:00 AM in Oncology ([facility])     Dx: Prostate cancer metastatic to bone [facility])   Test Result Released: Yes (seen)   0 Result Notes          Component Ref Range & Units (hover) 1 mo ago 2 mo ago 4 mo ago 5 mo ago 6 mo ago 7 mo ago 8 mo ago  Sodium 141 139 137 135 135 135 137  Potassium 4.4 3.8 4.2 4.0 3.6 3.8 4.4  Chloride 107 104 102 101 100 103 102  CO2 29 25 29 27 30 24 31   Glucose, Bld 112 High  111 High   CM 105 High  CM 296 High  CM 127 High  CM 212 High  CM 102 High  CM  Comment: Glucose reference range applies only to samples taken after fasting for at least 8 hours.  BUN 15 14 15 21 14 18 12   Creatinine 0.79 0.88 0.85 0.95 0.79 0.87 0.90  Calcium  9.4 9.7 9.5 9.2 9.3 9.2 9.7  Total Protein 6.8  6.6 7.0 6.6 6.7 7.4  Albumin 4.2  4.1 4.1 3.9 4.0 4.3  AST 15  19 19 15 24 27   ALT 13  21 22 17  53 High  36  Alkaline Phosphatase 44  61 87 104 87 77  Total Bilirubin 1.9 High   1.2 1.0 1.3 High  R 1.0 R 1.6 High  R  GFR, Estimated >60  >60 CM >60 CM >60 CM >60 CM >60 CM  Comment: (NOTE) Calculated using the CKD-EPI Creatinine Equation (2021)  Anion gap 5 10 CM 6 CM 7 CM 5 CM 8 CM 4 Low  CM  Comment: Performed at [facility], [address] [place], [place] [address]  Resulting Agency [facility] CL[facility] LAB        Specimen Collected: 09/30/23 08:19 Last Resulted: 09/30/23 09:06    EKG   Radiology No results found.  Procedures Procedures (including critical care time)  Medications Ordered in UC Medications - No data to display  Initial Impression / Assessment and Plan / UC Course  I have reviewed the triage vital signs and the nursing notes.  Pertinent labs & imaging results that were available during my care of the patient were reviewed by me and considered in my medical decision making (see chart for details).     Reviewed exam and symptoms with patient.  No red flags.  He is afebrile in clinic and well-appearing with no acute distress.  No signs of severe COVID on exam.  He did test positive for COVID-19.  Given his medical history does qualify for Paxlovid, however, after medication review unable to do Paxlovid due to contraindication with one of his immunotherapy agents relugolix.  Given this we will do molnupiravir twice daily for 5 days.  Promethazine DM as needed for cough, side effect  profile reviewed.  Discussed CDC guidelines for quarantine.  Reviewed rest fluids and OTC Tylenol  as needed.  Advise follow-up with his PCP/oncologist in 2 days for recheck.  Strict ER precautions reviewed and patient verbalized understanding Final Clinical Impressions(s) / UC Diagnoses   Final diagnoses:  Exposure to COVID-19 virus  COVID-19     Discharge Instructions      You have tested positive for COVID-19.  You may take molnupiravir which is an antiviral medication to help reduce complication risk of COVID as well as severity of symptoms.  Start Promethazine DM as needed for your cough.  Please note this medication will make you drowsy.  Do not drink alcohol or drive while on this medication.  Lots of rest and fluids.  Please follow-up with your PCP in 2 days for recheck.  Please go to the ER if you develop any worsening symptoms.  Hope you feel better soon!     ED Prescriptions     Medication Sig Dispense Auth. Provider   molnupiravir EUA (LAGEVRIO) 200 mg CAPS capsule Take 4 capsules (800 mg total) by mouth 2 (two) times daily for 5 days. 40 capsule Danasia Baker, Jodi R, NP   promethazine-dextromethorphan (PROMETHAZINE-DM) 6.25-15 MG/5ML syrup Take 5 mLs by mouth 3 (three) times daily as needed for cough. 118 mL Lindzie Boxx, Jodi R, NP      PDMP not reviewed this encounter.   Loreda Myla SAUNDERS, NP 11/04/23 1050

## 2023-11-04 NOTE — ED Triage Notes (Signed)
 Pt c/o productive cough w/green mucous, HA started yesterday. Pt states wife tested positive for COVID last Friday

## 2023-11-04 NOTE — Telephone Encounter (Signed)
 Pt called and states he has tested positive for Covid. Is being treated with molnupiravir twice daily for 5 days   He has rescheduled his appts

## 2023-11-05 ENCOUNTER — Other Ambulatory Visit

## 2023-11-05 ENCOUNTER — Ambulatory Visit

## 2023-11-19 ENCOUNTER — Ambulatory Visit
Admission: RE | Admit: 2023-11-19 | Discharge: 2023-11-19 | Disposition: A | Discharge status: Home / Self Care | Source: Ambulatory Visit

## 2023-11-19 DIAGNOSIS — C61 Malignant neoplasm of prostate: Secondary | ICD-10-CM | POA: Insufficient documentation

## 2023-11-19 DIAGNOSIS — C7951 Secondary malignant neoplasm of bone: Secondary | ICD-10-CM | POA: Insufficient documentation

## 2023-11-19 DIAGNOSIS — C778 Secondary and unspecified malignant neoplasm of lymph nodes of multiple regions: Secondary | ICD-10-CM | POA: Insufficient documentation

## 2023-11-19 NOTE — Progress Notes (Addendum)
  Radiation Oncology         [phone] ________________________________  Name: Nicholas Paul MRN: [id number]  Date of Service: 11/19/2023  DOB: 1950-09-15  Post Treatment Telephone Note  Diagnosis: Oligometastatic, Gleason 4+5, adenocarcinoma of the prostate involving abdominopelvic lymph nodes and 3 bony lesions with a pretreatment PSA of 13.7. (as documented in provider EOT note)  Pre Treatment IPSS Score: 5 (as documented in the provider consult note)  The patient was not available for call today. Unable to reach patient for today's appointment. 2 calls. No answer. Message was left w/ my extension [phone]. Call complete.  Patient has a scheduled follow up visit with his urologist, Dr. Nieves, on 12/25/2023 for ongoing surveillance. He was counseled that PSA levels will be drawn in the urology office, and was reassured that additional time is expected to improve bowel and bladder symptoms. He was encouraged to call back with concerns or questions regarding radiation.   This concludes the interaction.  Rosaline Minerva, LPN

## 2023-11-28 ENCOUNTER — Other Ambulatory Visit: Payer: Self-pay

## 2023-11-28 DIAGNOSIS — C61 Malignant neoplasm of prostate: Secondary | ICD-10-CM

## 2023-11-28 NOTE — Progress Notes (Unsigned)
 [facility] OFFICE PROGRESS NOTE  Patient Care Team: Geofm Glade PARAS, MD as PCP - General (Internal Medicine) Pietro Redell RAMAN, MD as PCP - Cardiology (Cardiology) Terence Deward MICAEL JULIANNA., MD as Consulting Physician (Urology) [place] [facility] [place] as Consulting Physician (Optometry) Vertell Pont, RN as Oncology Nurse Navigator Tina Pauletta BROCKS, MD as Consulting Physician (Oncology) Abigail Maude POUR [facility])  Assessment & Plan Prostate cancer metastatic to bone [facility]) Continue Orgovyx and darolutamide Follow up with labs in about   No orders of the defined types were placed in this encounter.    Pauletta BROCKS Tina, MD  INTERVAL HISTORY: Patient returns for follow-up.  Oncology History  Prostate cancer metastatic to bone (HCC)  07/15/2017 Tumor Marker   PSA 1.89   09/27/2017 Tumor Marker   PSA 2.26   11/06/2018 Tumor Marker   PSA 3.04   04/22/2019 Tumor Marker   PSA 2.97   12/23/2019 Tumor Marker   PSA 1.76   12/15/2020 Tumor Marker   PSA 2.43   12/18/2021 Tumor Marker   PSA 3.76   11/27/2022 Surgery   TURP for BPH with Lower urinary tract symptoms. High risk T1b GG5 (Gleason 4+5=9) in 30% of resected tissue.   12/24/2022 Tumor Marker   PSA 12.4   01/01/2023 PET scan   PSMA PET Radiotracer avid glandular noted on the right aspect of the prostate.  Potential more central activity difficult to separate from the urine activity. Focal radiotracer activity within the right seminal vesicle consistent with prostate adenocarcinoma metastases Radiotracer avid right and left iliac lymph nodes consistent with nodal metastases.  Radiotracer avid periaortic retroperitoneal lymph node consistent with nodal metastasis Skeletal metastases including sternum, L3 vertebral body, and left iliac bone.   01/09/2023 -  Chemotherapy   Started Orgovyx 01/17/23 darolutamide   01/17/2023 Initial Diagnosis   Prostate cancer metastatic to bone (HCC)   01/18/2023 Cancer Staging   Staging  form: Prostate, AJCC 8th Edition - Clinical: Stage IVB (cT1b, cN1, cM1b, PSA: 12.4, Grade Group: 5) - Signed by Tina Pauletta BROCKS, MD on 01/18/2023 Prostate specific antigen (PSA) range: 10 to 19 Gleason primary pattern: 4 Gleason secondary pattern: 5 Histologic grading system: 5 grade system   02/13/2023 Genetic Testing   [employer] Negative for pathologic variant   03/01/2023 Genetic Testing   Negative genetic testing on the CancerNext-Expanded+RNAinsight panel.  The report date is 03/01/2023.  The CancerNext-Expanded gene panel offered by [facility] and includes sequencing and rearrangement analysis for the following 77 genes: AIP, ALK, APC*, ATM*, AXIN2, BAP1, BARD1, BMPR1A, BRCA1*, BRCA2*, BRIP1*, CDC73, CDH1*, CDK4, CDKN1B, CDKN2A, CHEK2*, CTNNA1, DICER1, FH, FLCN, KIF1B, LZTR1, MAX, MEN1, MET, MLH1*, MSH2*, MSH3, MSH6*, MUTYH*, NF1*, NF2, NTHL1, PALB2*, PHOX2B, PMS2*, POT1, PRKAR1A, PTCH1, PTEN*, RAD51C*, RAD51D*, RB1, RET, SDHA, SDHAF2, SDHB, SDHC, SDHD, SMAD4, SMARCA4, SMARCB1, SMARCE1, STK11, SUFU, TMEM127, TP53*, TSC1, TSC2, and VHL (sequencing and deletion/duplication); EGFR, EGLN1, HOXB13, KIT, MITF, PDGFRA, POLD1, and POLE (sequencing only); EPCAM and GREM1 (deletion/duplication only). DNA and RNA analyses performed for * genes.    03/04/2023 Genetic Testing   Patient has genetic testing done for prostate cancer. Results were negative for pathologic mutations from [employer] 71 genes panel.   03/08/2023 - 05/20/2023 Chemotherapy   Patient is on Treatment Plan : PROSTATE Docetaxel  (75) q21d     06/18/2023 PET scan   PSMA PET IMPRESSION: 1. 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 2. Persistent right-sided prostatic tracer affinity,  suspicious for residual disease. 3. Relatively similar osseous metastasis, presumably having undergone interval healing is evidenced by increased sclerosis.      PHYSICAL  EXAMINATION: ECOG PERFORMANCE STATUS: {CHL ONC ECOG PS:[phone]}  There were no vitals filed for this visit. There were no vitals filed for this visit.  GENERAL: alert, no distress and comfortable SKIN: skin color normal and no bruising or petechiae or jaundice on exposed skin EYES: normal, sclera clear OROPHARYNX: no exudate  NECK: No palpable mass LYMPH:  no palpable cervical, axillary lymphadenopathy  LUNGS: clear to auscultation and percussion with normal breathing effort HEART: regular rate & rhythm  ABDOMEN: abdomen soft, non-tender and nondistended. Musculoskeletal: no edema NEURO: no focal motor/sensory deficits  Relevant data reviewed during this visit included ***

## 2023-11-28 NOTE — Assessment & Plan Note (Addendum)
 Continue Orgovyx and darolutamide Follow up with labs in about 3-4 months

## 2023-11-29 ENCOUNTER — Inpatient Hospital Stay

## 2023-11-29 ENCOUNTER — Inpatient Hospital Stay (HOSPITAL_BASED_OUTPATIENT_CLINIC_OR_DEPARTMENT_OTHER)

## 2023-11-29 VITALS — BP 123/81 | HR 52 | Temp 97.3°F | Resp 17 | Ht 69.0 in | Wt 173.0 lb

## 2023-11-29 DIAGNOSIS — D72810 Lymphocytopenia: Secondary | ICD-10-CM

## 2023-11-29 DIAGNOSIS — C61 Malignant neoplasm of prostate: Secondary | ICD-10-CM | POA: Insufficient documentation

## 2023-11-29 DIAGNOSIS — D6481 Anemia due to antineoplastic chemotherapy: Secondary | ICD-10-CM

## 2023-11-29 DIAGNOSIS — E895 Postprocedural testicular hypofunction: Secondary | ICD-10-CM | POA: Diagnosis not present

## 2023-11-29 DIAGNOSIS — T451X5A Adverse effect of antineoplastic and immunosuppressive drugs, initial encounter: Secondary | ICD-10-CM | POA: Diagnosis not present

## 2023-11-29 DIAGNOSIS — C7951 Secondary malignant neoplasm of bone: Secondary | ICD-10-CM

## 2023-11-29 LAB — CBC WITH DIFFERENTIAL (CANCER CENTER ONLY)
Abs Immature Granulocytes: 0.01 K/uL (ref 0.00–0.07)
Basophils Absolute: 0 K/uL (ref 0.0–0.1)
Basophils Relative: 1 %
Eosinophils Absolute: 0.1 K/uL (ref 0.0–0.5)
Eosinophils Relative: 2 %
HCT: 31.5 % — ABNORMAL LOW (ref 39.0–52.0)
Hemoglobin: 11.4 g/dL — ABNORMAL LOW (ref 13.0–17.0)
Immature Granulocytes: 0 %
Lymphocytes Relative: 13 %
Lymphs Abs: 0.5 K/uL — ABNORMAL LOW (ref 0.7–4.0)
MCH: 32.9 pg (ref 26.0–34.0)
MCHC: 36.2 g/dL — ABNORMAL HIGH (ref 30.0–36.0)
MCV: 91 fL (ref 80.0–100.0)
Monocytes Absolute: 0.4 K/uL (ref 0.1–1.0)
Monocytes Relative: 10 %
Neutro Abs: 2.8 K/uL (ref 1.7–7.7)
Neutrophils Relative %: 74 %
Platelet Count: 179 K/uL (ref 150–400)
RBC: 3.46 MIL/uL — ABNORMAL LOW (ref 4.22–5.81)
RDW: 13.1 % (ref 11.5–15.5)
WBC Count: 3.8 K/uL — ABNORMAL LOW (ref 4.0–10.5)
nRBC: 0 % (ref 0.0–0.2)

## 2023-11-29 LAB — CMP (CANCER CENTER ONLY)
ALT: 15 U/L (ref 0–44)
AST: 15 U/L (ref 15–41)
Albumin: 4 g/dL (ref 3.5–5.0)
Alkaline Phosphatase: 60 U/L (ref 38–126)
Anion gap: 4 — ABNORMAL LOW (ref 5–15)
BUN: 11 mg/dL (ref 8–23)
CO2: 29 mmol/L (ref 22–32)
Calcium: 9.5 mg/dL (ref 8.9–10.3)
Chloride: 104 mmol/L (ref 98–111)
Creatinine: 0.91 mg/dL (ref 0.61–1.24)
GFR, Estimated: >60 mL/min (ref >60)
Glucose, Bld: 107 mg/dL — ABNORMAL HIGH (ref 70–99)
Potassium: 4.3 mmol/L (ref 3.5–5.1)
Sodium: 137 mmol/L (ref 135–145)
Total Bilirubin: 1.1 mg/dL (ref 0.0–1.2)
Total Protein: 6.7 g/dL (ref 6.5–8.1)

## 2023-11-29 NOTE — Assessment & Plan Note (Addendum)
 Recent radiation and chemotherapy

## 2023-11-29 NOTE — Progress Notes (Signed)
 Patient is established with a treatment plan and is actively engaged in care. Nurse Navigator services not currently indicated at this time. Will re-evaluate if needs change or if additional support is requested.

## 2023-11-29 NOTE — Assessment & Plan Note (Addendum)
 Improving Repeat in about 3-4 months

## 2023-11-30 LAB — PROSTATE-SPECIFIC AG, SERUM (LABCORP): Prostate Specific Ag, Serum: <0.1 ng/mL (ref 0.0–4.0)

## 2023-11-30 LAB — TESTOSTERONE: Testosterone: 4 ng/dL — ABNORMAL LOW (ref 264–916)

## 2023-12-02 ENCOUNTER — Ambulatory Visit: Payer: Self-pay

## 2023-12-25 DIAGNOSIS — C7951 Secondary malignant neoplasm of bone: Secondary | ICD-10-CM | POA: Diagnosis not present

## 2023-12-25 DIAGNOSIS — C61 Malignant neoplasm of prostate: Secondary | ICD-10-CM | POA: Diagnosis not present

## 2023-12-30 ENCOUNTER — Ambulatory Visit
Admission: RE | Admit: 2023-12-30 | Discharge: 2023-12-30 | Disposition: A | Discharge status: Home / Self Care | Source: Ambulatory Visit | Attending: Family Medicine | Admitting: Family Medicine

## 2023-12-30 VITALS — BP 118/69 | HR 83 | Temp 98.1°F | Resp 17

## 2023-12-30 DIAGNOSIS — J209 Acute bronchitis, unspecified: Secondary | ICD-10-CM | POA: Diagnosis not present

## 2023-12-30 MED ORDER — AZITHROMYCIN 250 MG PO TABS: 250.0000 mg | ORAL_TABLET | Freq: Every day | ORAL | 0 refills | Status: DC

## 2023-12-30 NOTE — ED Triage Notes (Addendum)
 Pt present with c/o sore throat and nasal congestion x one week. Pt states he has taken Mucinex for relief. Denies fever. Pt reports cough. States he does not want testing, he wants something to give him some relief.

## 2023-12-30 NOTE — ED Provider Notes (Signed)
 [facility]    CSN: [id number] Arrival date & time: 12/30/23  0849      History   Chief Complaint Chief Complaint  Patient presents with   Nasal Congestion    Sore throat - Entered by patient    HPI Nicholas Paul is a 73 y.o. male  presents for evaluation of URI symptoms for 8-9 days. Patient reports associated symptoms of productive cough with congestion and sore throat. Denies N/V/D, fevers, ear pain, body aches, shortness of breath. Patient does not have a hx of asthma. Patient is not an active smoker.   Patient is currently undergoing radiation and chemotherapy for metastatic prostate cancer.  Pt has taken Robitussin and Mucinex OTC for symptoms. Pt has no other concerns at this time.   HPI  Past Medical History:  Diagnosis Date   BPH (benign prostatic hyperplasia)    urologist--- dr nieves   Cancer [facility])    Coronary artery disease cardiologist--- katheryn gerhardt NP   last CTA 02-17-2018  nonobstructive proximal and mid LAD, calcium  score 15;   nuclear stress test 06-09-2003 in epic,  no ischemia w/ ef 59%   Family history of bladder cancer    Family history of prostate cancer    GERD (gastroesophageal reflux disease)    H/O: rheumatic fever    noted after tonsillectomy   Head injury, closed, initial encounter 03/26/2019   Hyperlipidemia    Hypertension    followed by cardiology   IBS (irritable bowel syndrome)    Dr Avram   Internal hemorrhoids    Pulmonary nodule    4 mm RML nodule on CT scan in 12/12. Needs follow up in one year.       Patient Active Problem List   Diagnosis Date Noted   Lymphopenia 11/29/2023   Malignant neoplasm of prostate metastatic to lymph nodes of multiple sites (HCC) 07/01/2023   Port-A-Cath in place 04/18/2023   Anemia due to antineoplastic chemotherapy 03/29/2023   Genetic testing 03/04/2023   Family history of bladder cancer    Family history of prostate cancer    Prostate cancer metastatic to bone (HCC)  01/17/2023   At risk for side effect of medication 01/17/2023   Leg cramping 11/05/2022   COVID 07/24/2021   Herpes zoster without complication 03/11/2017   Prediabetes 09/03/2015   Gilbert syndrome 07/30/2011   Pulmonary nodule 05/21/2011   Pain in joint 07/14/2010   Irritable bowel syndrome 04/04/2010   Actinic keratosis 10/20/2009   History of colonic polyps 10/20/2009   HEMATURIA, MICROSCOPIC, HX OF 10/20/2009   Hyperlipidemia 07/31/2007   Essential hypertension 07/31/2007    Past Surgical History:  Procedure Laterality Date   COLONOSCOPY W/ POLYPECTOMY  2006   internal hemorrhoids 2011   GOLD SEED IMPLANT N/A 08/09/2023   Procedure: INSERTION, GOLD SEEDS;  Surgeon: Devere Lonni Righter, MD;  Location: [facility] OR;  Service: Urology;  Laterality: N/A;   IR IMAGING GUIDED PORT INSERTION  03/05/2023   IR REMOVAL TUN ACCESS W/ PORT W/O FL MOD SED  07/15/2023   PROSTATE BIOPSY     SHOULDER ARTHROSCOPY WITH OPEN ROTATOR CUFF REPAIR Right 2022   SPACE OAR INSTILLATION N/A 08/09/2023   Procedure: INJECTION, HYDROGEL SPACER;  Surgeon: Devere Lonni Righter, MD;  Location: [facility] OR;  Service: Urology;  Laterality: N/A;  SPACE OAR   THULIUM LASER TURP (TRANSURETHRAL RESECTION OF PROSTATE) N/A 07/14/2019   Procedure: THULIUM LASER TURP (TRANSURETHRAL RESECTION OF PROSTATE);  Surgeon: nieves Cough, MD;  Location:  [facility];  Service: Urology;  Laterality: N/A;   TONSILLECTOMY AND ADENOIDECTOMY         Home Medications    Prior to Admission medications   Medication Sig Start Date End Date Taking? Authorizing Provider  azithromycin  (ZITHROMAX ) 250 MG tablet Take 1 tablet (250 mg total) by mouth daily. Take first 2 tablets together, then 1 every day until finished. 12/30/23  Yes Helina Hullum, Jodi R, NP  amLODipine  (NORVASC ) 5 MG tablet TAKE 1 TABLET (5 MG TOTAL) BY MOUTH DAILY. 04/17/23   Daneen Damien BROCKS, NP  Ascorbic Acid (VITAMIN C PO) Take 1 tablet by mouth daily.     [provider]  aspirin  EC 81 MG tablet Take 81 mg by mouth in the morning.    [provider]  atorvastatin  (LIPITOR) 10 MG tablet TAKE 1 TABLET BY MOUTH EVERY DAY 03/04/23   Pietro Redell RAMAN, MD  B Complex-C (B-COMPLEX WITH VITAMIN C) tablet Take 1 tablet by mouth daily.    [provider]  benazepril (LOTENSIN) 40 MG tablet TAKE 1 TABLET BY MOUTH EVERY DAY Patient taking differently: Take 40 mg by mouth every evening. 06/13/23   Pietro Redell RAMAN, MD  Cholecalciferol (VITAMIN D ) 125 MCG (5000 UT) CAPS Take 125 mcg by mouth daily.  08/25/18   [provider]  Coenzyme Q10 (CO Q 10 PO) Take 1 capsule by mouth daily.    [provider]  darolutamide (NUBEQA) 300 MG tablet Take 600 mg by mouth 2 (two) times daily with a meal.    [provider]  famotidine (PEPCID) 10 MG tablet Take 10 mg by mouth as needed for heartburn or indigestion.    [provider]  hydrochlorothiazide  (MICROZIDE ) 12.5 MG capsule TAKE 1 CAPSULE BY MOUTH EVERY DAY 05/31/23   Pietro Redell RAMAN, MD  Naproxen  Sod-diphenhydrAMINE (ALEVE  PM) 220-25 MG TABS Take 1 tablet by mouth at bedtime as needed (sleep/pain.).    [provider]  Omega-3 Fatty Acids (OMEGA 3 PO) Take 1 capsule by mouth daily.     [provider]  promethazine -dextromethorphan (PROMETHAZINE -DM) 6.25-15 MG/5ML syrup Take 5 mLs by mouth 3 (three) times daily as needed for cough. 11/04/23   Charnel Giles, Jodi R, NP  psyllium (METAMUCIL) 58.6 % packet Take 2 packets by mouth 2 (two) times daily.     [provider]  relugolix (ORGOVYX) 120 MG tablet Take 120 mg by mouth in the morning.    [provider]    Family History Family History  Problem Relation Age of Onset   Hyperlipidemia Mother    Hypertension Mother    Bladder Cancer Father 64       heavy smoker   Bladder Cancer Brother        dx. 30s, non-smoker   Hypertension Brother    Brain cancer Maternal Uncle 47        glioblastoma   Bladder Cancer Paternal Aunt    Prostate cancer Paternal Grandfather        d. 2s   Prostate cancer Cousin        mat first cousin   Stroke Neg Hx    Diabetes Neg Hx    Heart attack Neg Hx    Colon cancer Neg Hx    Colon polyps Neg Hx    Esophageal cancer Neg Hx    Rectal cancer Neg Hx    Stomach cancer Neg Hx     Social History Social History   Tobacco  Use   Smoking status: Never    Passive exposure: Never   Smokeless tobacco: Never  Vaping Use   Vaping status: Never Used  Substance Use Topics   Alcohol use: Yes    Alcohol/week: 5.0 standard drinks of alcohol    Types: 5 Glasses of wine per week   Drug use: Never     Allergies   Doxazosin mesylate and Crestor  [rosuvastatin ]   Review of Systems Review of Systems  HENT:  Positive for congestion and sore throat.   Respiratory:  Positive for cough.      Physical Exam Triage Vital Signs ED Triage Vitals [12/30/23 0857]  Encounter Vitals Group     BP 118/69     Girls Systolic BP Percentile      Girls Diastolic BP Percentile      Boys Systolic BP Percentile      Boys Diastolic BP Percentile      Pulse Rate 83     Resp 17     Temp 98.1 F (36.7 C)     Temp Source Oral     SpO2 98 %     Weight      Height      Head Circumference      Peak Flow      Pain Score 0     Pain Loc      Pain Education      Exclude from Growth Chart    No data found.  Updated Vital Signs BP 118/69 (BP Location: Right Arm)   Pulse 83   Temp 98.1 F (36.7 C) (Oral)   Resp 17   SpO2 98%   Visual Acuity Right Eye Distance:   Left Eye Distance:   Bilateral Distance:    Right Eye Near:   Left Eye Near:    Bilateral Near:     Physical Exam Vitals and nursing note reviewed.  Constitutional:      General: He is not in acute distress.    Appearance: Normal appearance. He is not ill-appearing or toxic-appearing.  HENT:     Head: Normocephalic and atraumatic.     Right Ear: Tympanic membrane and  ear canal normal.     Left Ear: Tympanic membrane and ear canal normal.     Nose: Congestion present.     Mouth/Throat:     Mouth: Mucous membranes are moist.     Pharynx: No oropharyngeal exudate or posterior oropharyngeal erythema.  Eyes:     Pupils: Pupils are equal, round, and reactive to light.  Cardiovascular:     Rate and Rhythm: Normal rate and regular rhythm.     Heart sounds: Normal heart sounds.  Pulmonary:     Effort: Pulmonary effort is normal.     Breath sounds: Normal breath sounds. No wheezing or rhonchi.  Musculoskeletal:     Cervical back: Normal range of motion and neck supple.  Lymphadenopathy:     Cervical: No cervical adenopathy.  Skin:    General: Skin is warm and dry.  Neurological:     General: No focal deficit present.     Mental Status: He is alert and oriented to person, place, and time.  Psychiatric:        Mood and Affect: Mood normal.        Behavior: Behavior normal.      UC Treatments / Results  Labs (all labs ordered are listed, but only abnormal results are displayed) Labs Reviewed - No data to display CMP (Cancer  Center only) Order: [id number]  Status: Final result     Next appt: 03/05/2024 at 08:30 AM in Oncology ([facility])     Dx: Prostate cancer metastatic to bone [facility])   Test Result Released: Yes (seen)   1 Result Note     View Follow-Up Encounter          Component Ref Range & Units (hover) 1 mo ago 3 mo ago 4 mo ago 6 mo ago 7 mo ago 8 mo ago 9 mo ago  Sodium 137 141 139 137 135 135 135  Potassium 4.3 4.4 3.8 4.2 4.0 3.6 3.8  Chloride 104 107 104 102 101 100 103  CO2 29 29 25 29 27 30 24   Glucose, Bld 107 High  112 High  CM 111 High  CM 105 High  CM 296 High  CM 127 High  CM 212 High  CM  Comment: Glucose reference range applies only to samples taken after fasting for at least 8 hours.  BUN 11 15 14 15 21 14 18   Creatinine 0.91 0.79 0.88 0.85 0.95 0.79 0.87  Calcium  9.5 9.4 9.7 9.5 9.2 9.3 9.2  Total Protein 6.7  6.8  6.6 7.0 6.6 6.7  Albumin 4.0 4.2  4.1 4.1 3.9 4.0  AST 15 15  19 19 15 24   ALT 15 13  21 22 17  53 High   Alkaline Phosphatase 60 44  61 87 104 87  Total Bilirubin 1.1 1.9 High   1.2 1.0 1.3 High  R 1.0 R  GFR, Estimated >60 >60 CM  >60 CM >60 CM >60 CM >60 CM  Comment: (NOTE) Calculated using the CKD-EPI Creatinine Equation (2021)  Anion gap 4 Low  5 CM 10 CM 6 CM 7 CM 5 CM 8 CM  Comment: Performed at [facility], [address], [place], [place] [address]  Resulting Agency [facility]        Specimen Collected: 11/29/23 08:45 Last Resulted: 11/29/23 09:42    EKG   Radiology No results found.  Procedures Procedures (including critical care time)  Medications Ordered in UC Medications - No data to display  Initial Impression / Assessment and Plan / UC Course  I have reviewed the triage vital signs and the nursing notes.  Pertinent labs & imaging results that were available during my care of the patient were reviewed by me and considered in my medical decision making (see chart for details).     Reviewed exam and symptoms with patient.  No red flags.  Given patient immunocompromised state and symptoms will start Zithromycin.  He will continue OTC cough medicine as needed.  Encouraged rest fluids and PCP follow-up if symptoms do not improve.  ER precautions reviewed and patient verbalized understanding Final Clinical Impressions(s) / UC Diagnoses   Final diagnoses:  Acute bronchitis, unspecified organism     Discharge Instructions      Start azithromycin  as prescribed.  You may continue over-the-counter cough medicine as needed.  Lots of rest and fluids.  Please follow-up with your PCP if your symptoms are not improving.  Please go to the ER for any worsening symptoms.  Hope you feel better soon!    ED Prescriptions     Medication Sig Dispense Auth. Provider    azithromycin  (ZITHROMAX ) 250 MG tablet Take 1 tablet (250 mg total) by mouth daily. Take  first 2 tablets together, then 1 every day until finished. 6 tablet Marguerita Stapp, Jodi R, NP      PDMP not reviewed this encounter.   Loreda Myla SAUNDERS, NP 12/30/23 [phone]

## 2023-12-30 NOTE — Discharge Instructions (Addendum)
 Start azithromycin  as prescribed.  You may continue over-the-counter cough medicine as needed.  Lots of rest and fluids.  Please follow-up with your PCP if your symptoms are not improving.  Please go to the ER for any worsening symptoms.  Hope you feel better soon!

## 2024-01-03 ENCOUNTER — Ambulatory Visit: Admitting: Internal Medicine

## 2024-02-16 ENCOUNTER — Other Ambulatory Visit: Payer: Self-pay

## 2024-02-16 ENCOUNTER — Ambulatory Visit
Admission: RE | Admit: 2024-02-16 | Discharge: 2024-02-16 | Disposition: A | Discharge status: Home / Self Care | Source: Ambulatory Visit | Attending: Family Medicine | Admitting: Family Medicine

## 2024-02-16 VITALS — BP 114/65 | HR 79 | Temp 99.0°F | Resp 17

## 2024-02-16 DIAGNOSIS — J0100 Acute maxillary sinusitis, unspecified: Secondary | ICD-10-CM

## 2024-02-16 MED ORDER — AMOXICILLIN-POT CLAVULANATE 875-125 MG PO TABS: 1.0000 | ORAL_TABLET | Freq: Two times a day (BID) | ORAL | 0 refills | Status: DC

## 2024-02-16 MED ORDER — FLUTICASONE PROPIONATE 50 MCG/ACT NA SUSP: 1.0000 | Freq: Every day | NASAL | 0 refills | Status: AC

## 2024-02-16 NOTE — ED Triage Notes (Addendum)
 Pt c/o sinus pressure, nasal drainage, post nasal drip, productive cough w/green/yellow mucousx a little over a week. Pt took a home COVID test yesterday and it was neg

## 2024-02-16 NOTE — ED Provider Notes (Signed)
 [facility]    CSN: [id number] Arrival date & time: 02/16/24  1227      History   Chief Complaint Chief Complaint  Patient presents with   Nasal Congestion    Chest congestion - Entered by patient    HPI Nicholas Paul is a 73 y.o. male  presents for evaluation of URI symptoms for 7 days. Patient reports associated symptoms of sinus pressure/pain with postnasal drip, cough with purulent sputum. Denies N/V/D, fevers, ear pain, body aches, shortness of breath. Patient does not have a hx of asthma. Patient is not an active smoker.  Reports a negative home COVID test.  Pt has no other concerns at this time.   HPI  Past Medical History:  Diagnosis Date   BPH (benign prostatic hyperplasia)    urologist--- dr nieves   Cancer [facility])    Coronary artery disease cardiologist--- katheryn gerhardt NP   last CTA 02-17-2018  nonobstructive proximal and mid LAD, calcium  score 15;   nuclear stress test 06-09-2003 in epic,  no ischemia w/ ef 59%   Family history of bladder cancer    Family history of prostate cancer    GERD (gastroesophageal reflux disease)    H/O: rheumatic fever    noted after tonsillectomy   Head injury, closed, initial encounter 03/26/2019   Hyperlipidemia    Hypertension    followed by cardiology   IBS (irritable bowel syndrome)    Dr Avram   Internal hemorrhoids    Pulmonary nodule    4 mm RML nodule on CT scan in 12/12. Needs follow up in one year.       Patient Active Problem List   Diagnosis Date Noted   Lymphopenia 11/29/2023   Malignant neoplasm of prostate metastatic to lymph nodes of multiple sites (HCC) 07/01/2023   Port-A-Cath in place 04/18/2023   Anemia due to antineoplastic chemotherapy 03/29/2023   Genetic testing 03/04/2023   Family history of bladder cancer    Family history of prostate cancer    Prostate cancer metastatic to bone (HCC) 01/17/2023   At risk for side effect of medication 01/17/2023   Leg cramping 11/05/2022    COVID 07/24/2021   Herpes zoster without complication 03/11/2017   Prediabetes 09/03/2015   Gilbert syndrome 07/30/2011   Pulmonary nodule 05/21/2011   Pain in joint 07/14/2010   Irritable bowel syndrome 04/04/2010   Actinic keratosis 10/20/2009   History of colonic polyps 10/20/2009   HEMATURIA, MICROSCOPIC, HX OF 10/20/2009   Hyperlipidemia 07/31/2007   Essential hypertension 07/31/2007    Past Surgical History:  Procedure Laterality Date   COLONOSCOPY W/ POLYPECTOMY  2006   internal hemorrhoids 2011   GOLD SEED IMPLANT N/A 08/09/2023   Procedure: INSERTION, GOLD SEEDS;  Surgeon: Devere Lonni Righter, MD;  Location: [facility] OR;  Service: Urology;  Laterality: N/A;   IR IMAGING GUIDED PORT INSERTION  03/05/2023   IR REMOVAL TUN ACCESS W/ PORT W/O FL MOD SED  07/15/2023   PROSTATE BIOPSY     SHOULDER ARTHROSCOPY WITH OPEN ROTATOR CUFF REPAIR Right 2022   SPACE OAR INSTILLATION N/A 08/09/2023   Procedure: INJECTION, HYDROGEL SPACER;  Surgeon: Devere Lonni Righter, MD;  Location[facility] OR;  Service: Urology;  Laterality: N/A;  SPACE OAR   THULIUM LASER TURP (TRANSURETHRAL RESECTION OF PROSTATE) N/A 07/14/2019   Procedure: THULIUM LASER TURP (TRANSURETHRAL RESECTION OF PROSTATE);  Surgeon: nieves Cough, MD;  Location: [facility];  Service: Urology;  Laterality: N/A;   TONSILLECTOMY  AND ADENOIDECTOMY         Home Medications    Prior to Admission medications   Medication Sig Start Date End Date Taking? Authorizing Provider  amoxicillin -clavulanate (AUGMENTIN ) 875-125 MG tablet Take 1 tablet by mouth every 12 (twelve) hours. 02/16/24  Yes Jerline Linzy, Jodi R, NP  fluticasone  (FLONASE ) 50 MCG/ACT nasal spray Place 1 spray into both nostrils daily. 02/16/24  Yes Chijioke Lasser, Jodi R, NP  amLODipine  (NORVASC ) 5 MG tablet TAKE 1 TABLET (5 MG TOTAL) BY MOUTH DAILY. 04/17/23   Daneen Damien BROCKS, NP  Ascorbic Acid (VITAMIN C PO) Take 1 tablet by mouth daily.    [provider]   aspirin  EC 81 MG tablet Take 81 mg by mouth in the morning.    [provider]  atorvastatin  (LIPITOR) 10 MG tablet TAKE 1 TABLET BY MOUTH EVERY DAY 03/04/23   Pietro Redell RAMAN, MD  azithromycin  (ZITHROMAX ) 250 MG tablet Take 1 tablet (250 mg total) by mouth daily. Take first 2 tablets together, then 1 every day until finished. 12/30/23   Shatera Rennert, Jodi R, NP  B Complex-C (B-COMPLEX WITH VITAMIN C) tablet Take 1 tablet by mouth daily.    [provider]  benazepril (LOTENSIN) 40 MG tablet TAKE 1 TABLET BY MOUTH EVERY DAY Patient taking differently: Take 40 mg by mouth every evening. 06/13/23   Pietro Redell RAMAN, MD  Cholecalciferol (VITAMIN D ) 125 MCG (5000 UT) CAPS Take 125 mcg by mouth daily.  08/25/18   [provider]  Coenzyme Q10 (CO Q 10 PO) Take 1 capsule by mouth daily.    [provider]  darolutamide (NUBEQA) 300 MG tablet Take 600 mg by mouth 2 (two) times daily with a meal.    [provider]  famotidine (PEPCID) 10 MG tablet Take 10 mg by mouth as needed for heartburn or indigestion.    [provider]  hydrochlorothiazide  (MICROZIDE ) 12.5 MG capsule TAKE 1 CAPSULE BY MOUTH EVERY DAY 05/31/23   Pietro Redell RAMAN, MD  Naproxen  Sod-diphenhydrAMINE (ALEVE  PM) 220-25 MG TABS Take 1 tablet by mouth at bedtime as needed (sleep/pain.).    [provider]  Omega-3 Fatty Acids (OMEGA 3 PO) Take 1 capsule by mouth daily.     [provider]  promethazine -dextromethorphan (PROMETHAZINE -DM) 6.25-15 MG/5ML syrup Take 5 mLs by mouth 3 (three) times daily as needed for cough. 11/04/23   Uriyah Massimo, Jodi R, NP  psyllium (METAMUCIL) 58.6 % packet Take 2 packets by mouth 2 (two) times daily.     [provider]  relugolix (ORGOVYX) 120 MG tablet Take 120 mg by mouth in the morning.    [provider]    Family History Family History  Problem Relation Age of Onset   Hyperlipidemia Mother    Hypertension Mother     Bladder Cancer Father 63       heavy smoker   Bladder Cancer Brother        dx. 30s, non-smoker   Hypertension Brother    Brain cancer Maternal Uncle 15       glioblastoma   Bladder Cancer Paternal Aunt    Prostate cancer Paternal Grandfather        d. 2s   Prostate cancer Cousin        mat first cousin   Stroke Neg Hx    Diabetes Neg Hx    Heart attack Neg Hx    Colon cancer Neg Hx    Colon polyps Neg Hx  Esophageal cancer Neg Hx    Rectal cancer Neg Hx    Stomach cancer Neg Hx     Social History Social History   Tobacco Use   Smoking status: Never    Passive exposure: Never   Smokeless tobacco: Never  Vaping Use   Vaping status: Never Used  Substance Use Topics   Alcohol use: Yes    Alcohol/week: 3.0 standard drinks of alcohol    Types: 3 Glasses of wine per week   Drug use: Never     Allergies   Doxazosin mesylate and Crestor  [rosuvastatin ]   Review of Systems Review of Systems  HENT:  Positive for congestion, postnasal drip, sinus pressure and sinus pain.   Respiratory:  Positive for cough.      Physical Exam Triage Vital Signs ED Triage Vitals [02/16/24 1234]  Encounter Vitals Group     BP      Girls Systolic BP Percentile      Girls Diastolic BP Percentile      Boys Systolic BP Percentile      Boys Diastolic BP Percentile      Pulse      Resp      Temp      Temp src      SpO2      Weight      Height      Head Circumference      Peak Flow      Pain Score 5     Pain Loc      Pain Education      Exclude from Growth Chart    No data found.  Updated Vital Signs BP 114/65   Pulse 79   Temp 99 F (37.2 C) (Oral)   Resp 17   SpO2 96%   Visual Acuity Right Eye Distance:   Left Eye Distance:   Bilateral Distance:    Right Eye Near:   Left Eye Near:    Bilateral Near:     Physical Exam Vitals and nursing note reviewed.  Constitutional:      General: He is not in acute distress.    Appearance: Normal appearance. He is not  ill-appearing or toxic-appearing.  HENT:     Head: Normocephalic and atraumatic.     Right Ear: Tympanic membrane and ear canal normal.     Left Ear: Tympanic membrane and ear canal normal.     Nose: Congestion present.     Right Sinus: Maxillary sinus tenderness present. No frontal sinus tenderness.     Left Sinus: Maxillary sinus tenderness present. No frontal sinus tenderness.     Mouth/Throat:     Mouth: Mucous membranes are moist.     Pharynx: No oropharyngeal exudate or posterior oropharyngeal erythema.  Eyes:     Pupils: Pupils are equal, round, and reactive to light.  Cardiovascular:     Rate and Rhythm: Normal rate and regular rhythm.     Heart sounds: Normal heart sounds.  Pulmonary:     Effort: Pulmonary effort is normal.     Breath sounds: Normal breath sounds. No wheezing or rhonchi.  Musculoskeletal:     Cervical back: Normal range of motion and neck supple.  Lymphadenopathy:     Cervical: No cervical adenopathy.  Skin:    General: Skin is warm and dry.  Neurological:     General: No focal deficit present.     Mental Status: He is alert and oriented to person, place, and time.  Psychiatric:  Mood and Affect: Mood normal.        Behavior: Behavior normal.      UC Treatments / Results  Labs (all labs ordered are listed, but only abnormal results are displayed) Labs Reviewed - No data to display  Contains abnormal data CMP (Cancer Center only) Order: [id number]  Status: Final result     Next appt: 03/05/2024 at 08:30 AM in Oncology ([facility])     Dx: Prostate cancer metastatic to bone [facility])   Test Result Released: Yes (seen)   1 Result Note     View Follow-Up Encounter          Component Ref Range & Units (hover) 2 mo ago (11/29/23) 4 mo ago (09/30/23) 6 mo ago (08/09/23) 7 mo ago (07/01/23) 9 mo ago (05/16/23) 10 mo ago (04/18/23) 10 mo ago (03/29/23)  Sodium 137 141 139 137 135 135 135  Potassium 4.3 4.4 3.8 4.2 4.0 3.6 3.8  Chloride 104  107 104 102 101 100 103  CO2 29 29 25 29 27 30 24   Glucose, Bld 107 High  112 High  CM 111 High  CM 105 High  CM 296 High  CM 127 High  CM 212 High  CM  Comment: Glucose reference range applies only to samples taken after fasting for at least 8 hours.  BUN 11 15 14 15 21 14 18   Creatinine 0.91 0.79 0.88 0.85 0.95 0.79 0.87  Calcium  9.5 9.4 9.7 9.5 9.2 9.3 9.2  Total Protein 6.7 6.8  6.6 7.0 6.6 6.7  Albumin 4.0 4.2  4.1 4.1 3.9 4.0  AST 15 15  19 19 15 24   ALT 15 13  21 22 17  53 High   Alkaline Phosphatase 60 44  61 87 104 87  Total Bilirubin 1.1 1.9 High   1.2 1.0 1.3 High  R 1.0 R  GFR, Estimated >60 >60 CM  >60 CM >60 CM >60 CM >60 CM  Comment: (NOTE) Calculated using the CKD-EPI Creatinine Equation (2021)  Anion gap 4 Low  5 CM 10 CM 6 CM 7 CM 5 CM 8 CM  Comment: Performed at [facility], [address] [place], [place] [address]  Resulting Agency [facility] CLIN LAB [facility] LAB [facility] LAB CH CLIN LAB [facility] CLIN LAB        Specimen Collected: 11/29/23 08:45 Last Resulted: 11/29/23 09:42    EKG   Radiology No results found.  Procedures Procedures (including critical care time)  Medications Ordered in UC Medications - No data to display  Initial Impression / Assessment and Plan / UC Course  I have reviewed the triage vital signs and the nursing notes.  Pertinent labs & imaging results that were available during my care of the patient were reviewed by me and considered in my medical decision making (see chart for details).     Reviewed exam and symptoms with patient.  No red flags.  Will start Augmentin  for sinusitis.  Flonase  daily.  Advise rest fluids and PCP follow-up if symptoms do not improve.  ER precautions reviewed Final Clinical Impressions(s) / UC Diagnoses   Final diagnoses:  Acute maxillary sinusitis, recurrence not specified     Discharge Instructions      Start Augmentin  twice daily for 7 days.  Do Flonase   daily as well.  Lots of rest and fluids.  Please follow-up with your PCP if your symptoms do not improve.  Please  go to the ER for any worsening symptoms.  Hope you feel better soon!     ED Prescriptions     Medication Sig Dispense Auth. Provider   amoxicillin -clavulanate (AUGMENTIN ) 875-125 MG tablet Take 1 tablet by mouth every 12 (twelve) hours. 14 tablet Bernhardt Riemenschneider, Jodi R, NP   fluticasone  (FLONASE ) 50 MCG/ACT nasal spray Place 1 spray into both nostrils daily. 15.8 mL Lacee Grey, Jodi R, NP      PDMP not reviewed this encounter.   Loreda Myla SAUNDERS, NP 02/16/24 1246

## 2024-02-16 NOTE — Discharge Instructions (Signed)
 Start Augmentin  twice daily for 7 days.  Do Flonase  daily as well.  Lots of rest and fluids.  Please follow-up with your PCP if your symptoms do not improve.  Please go to the ER for any worsening symptoms.  Hope you feel better soon!

## 2024-03-03 DIAGNOSIS — H2513 Age-related nuclear cataract, bilateral: Secondary | ICD-10-CM | POA: Diagnosis not present

## 2024-03-03 DIAGNOSIS — H5203 Hypermetropia, bilateral: Secondary | ICD-10-CM | POA: Diagnosis not present

## 2024-03-03 DIAGNOSIS — H18593 Other hereditary corneal dystrophies, bilateral: Secondary | ICD-10-CM | POA: Diagnosis not present

## 2024-03-04 NOTE — Assessment & Plan Note (Addendum)
 03/2023 normal bone mineral density study calcium  (1000-1200 mg daily from food and supplements) and vitamin D3 (1000 IU daily) Healthy diet to prevent diabetes Control HTN, HLD. Weight-bearing exercises (30 minutes per day) Limit alcohol consumption and avoid smoking He should continue to see his cardiology team to optimize his cardiac condition while on treatment for active prostate cancer CBC, CMP, T&S next months

## 2024-03-04 NOTE — Assessment & Plan Note (Addendum)
 Continue Orgovyx and darolutamide Follow up with labs in about 3 months

## 2024-03-04 NOTE — Progress Notes (Unsigned)
 [facility] OFFICE PROGRESS NOTE  Patient Care Team: Geofm Glade PARAS, MD as PCP - General (Internal Medicine) Pietro Redell RAMAN, MD as PCP - Cardiology (Cardiology) Terence Deward MICAEL JULIANNA., MD as Consulting Physician (Urology) [facility] [place] as Consulting Physician (Optometry) Vertell Pont, RN as Oncology Nurse Navigator Tina Pauletta BROCKS, MD as Consulting Physician (Oncology) Abigail Maude POUR [facility])   73 y.o.man with h/o hypertension, hyperlipidemia, BPH here for follow up for metastatic prostate cancer.  He has excellent performance status despite his age.    Current diagnosis: mHSPC.  Initial diagnosis: mHSPC. PSA 12.4 with GG5 (Gleason 4+5=9). High volume disease with nonregional and regional lymph nodes, and 4 sites of bone metastases. Germline: Negative genetic testing on the CancerNext-Expanded+RNAinsight pane  Somatic: not yet. Will perform upon progression Treatment: 12/2022. Started relugolix and 01/17/23 darolutamide  03/08/23 C1D1 docetazel 03/29/23 C2D1 docetaxel  04/18/23 PSA <0.1 04/18/24 C3D1 docetaxel  05/17/23 C4 docetaxel  08/21/23-10/17/23 radiation IMRT to prostate plus SBRT to oligometastatic lesion in the sternum   Clinically he is to well. Assessment & Plan Prostate cancer metastatic to bone [facility]) Continue Orgovyx and darolutamide Follow up with labs in about 3 months At risk for side effect of medication 03/2023 normal bone mineral density study calcium  (1000-1200 mg daily from food and supplements) and vitamin D3 (1000 IU daily) Healthy diet to prevent diabetes Control HTN, HLD. Weight-bearing exercises (30 minutes per day) Limit alcohol consumption and avoid smoking He should continue to see his cardiology team to optimize his cardiac condition while on treatment for active prostate cancer CBC, CMP, T&S next months Essential hypertension On amlodipine  and hydrochlorothiazide  and benazepril.  Orders Placed This Encounter  Procedures    PSA    Standing Status:   Future    Number of Occurrences:   1    Expiration Date:   03/05/2025   CBC with Differential (Cancer Center Only)    Standing Status:   Future    Expiration Date:   03/05/2025   CMP (Cancer Center only)    Standing Status:   Future    Expiration Date:   03/05/2025   PSA    Standing Status:   Future    Expiration Date:   03/05/2025     Pauletta BROCKS Tina, MD  INTERVAL HISTORY: Patient returns for follow-up. He saw Dr. Renelda a few weeks ago and did not have labs.  He is still recovering well in general.  He has some side effects from radiation.  Report of constipation and help with suppositories and Preparation H.  Symptom has mostly resolved.  He is able to eat and drink well.  He has some pelvic pain and lower extremity pain intermittently after recovering from radiation.  This week he has been feeling well.  He is drinking enough fluids.  He denies any urinary difficulties.  He has good urine output.  No new bone pain or back pain.  He is able to be active and playing golf and going back to the gym now.  Oncology History  Prostate cancer metastatic to bone (HCC)  07/15/2017 Tumor Marker   PSA 1.89   09/27/2017 Tumor Marker   PSA 2.26   11/06/2018 Tumor Marker   PSA 3.04   04/22/2019 Tumor Marker   PSA 2.97   12/23/2019 Tumor Marker   PSA 1.76   12/15/2020 Tumor Marker   PSA 2.43   12/18/2021 Tumor Marker   PSA 3.76   11/27/2022 Surgery   TURP for  BPH with Lower urinary tract symptoms. High risk T1b GG5 (Gleason 4+5=9) in 30% of resected tissue.   12/24/2022 Tumor Marker   PSA 12.4   01/01/2023 PET scan   PSMA PET Radiotracer avid glandular noted on the right aspect of the prostate.  Potential more central activity difficult to separate from the urine activity. Focal radiotracer activity within the right seminal vesicle consistent with prostate adenocarcinoma metastases Radiotracer avid right and left iliac lymph nodes consistent with nodal  metastases.  Radiotracer avid periaortic retroperitoneal lymph node consistent with nodal metastasis Skeletal metastases including sternum, L3 vertebral body, and left iliac bone.   01/09/2023 -  Chemotherapy   Started Orgovyx 01/17/23 darolutamide   01/17/2023 Initial Diagnosis   Prostate cancer metastatic to bone (HCC)   01/18/2023 Cancer Staging   Staging form: Prostate, AJCC 8th Edition - Clinical: Stage IVB (cT1b, cN1, cM1b, PSA: 12.4, Grade Group: 5) - Signed by Tina Pauletta BROCKS, MD on 01/18/2023 Prostate specific antigen (PSA) range: 10 to 19 Gleason primary pattern: 4 Gleason secondary pattern: 5 Histologic grading system: 5 grade system   02/13/2023 Genetic Testing   [employer] Negative for pathologic variant   03/01/2023 Genetic Testing   Negative genetic testing on the CancerNext-Expanded+RNAinsight panel.  The report date is 03/01/2023.  The CancerNext-Expanded gene panel offered by [facility] and includes sequencing and rearrangement analysis for the following 77 genes: AIP, ALK, APC*, ATM*, AXIN2, BAP1, BARD1, BMPR1A, BRCA1*, BRCA2*, BRIP1*, CDC73, CDH1*, CDK4, CDKN1B, CDKN2A, CHEK2*, CTNNA1, DICER1, FH, FLCN, KIF1B, LZTR1, MAX, MEN1, MET, MLH1*, MSH2*, MSH3, MSH6*, MUTYH*, NF1*, NF2, NTHL1, PALB2*, PHOX2B, PMS2*, POT1, PRKAR1A, PTCH1, PTEN*, RAD51C*, RAD51D*, RB1, RET, SDHA, SDHAF2, SDHB, SDHC, SDHD, SMAD4, SMARCA4, SMARCB1, SMARCE1, STK11, SUFU, TMEM127, TP53*, TSC1, TSC2, and VHL (sequencing and deletion/duplication); EGFR, EGLN1, HOXB13, KIT, MITF, PDGFRA, POLD1, and POLE (sequencing only); EPCAM and GREM1 (deletion/duplication only). DNA and RNA analyses performed for * genes.    03/04/2023 Genetic Testing   Patient has genetic testing done for prostate cancer. Results were negative for pathologic mutations from [employer] 71 genes panel.   03/08/2023 - 05/20/2023 Chemotherapy   Treatment Plan : PROSTATE Docetaxel  (75) q21d x 4 cycles. PSA <0.1 after C2.   06/18/2023 PET  scan   PSMA PET IMPRESSION: 1. 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 2. Persistent right-sided prostatic tracer affinity, suspicious for residual disease. 3. Relatively similar osseous metastasis, presumably having undergone interval healing is evidenced by increased sclerosis.   08/21/2023 - 10/17/2023 Radiation Therapy   IMRT to prostate with boost and sternum.       PHYSICAL EXAMINATION: ECOG PERFORMANCE STATUS: 0 - Asymptomatic  Vitals:   03/05/24 0851  BP: 118/84  Pulse: 76  Resp: 18  Temp: 97.8 F (36.6 C)  SpO2: 99%   Filed Weights   03/05/24 0851  Weight: 175 lb (79.4 kg)    GENERAL: alert, no distress and comfortable SKIN: skin color normal and no jaundice or bruising or petechiae on exposed skin EYES: normal, sclera clear OROPHARYNX: no exudate  LUNGS: clear to auscultation and no wheeze or rales with normal breathing effort HEART: regular rate & rhythm  ABDOMEN: abdomen soft, non-tender and nondistended. Musculoskeletal: no point tenderness   Relevant data reviewed during this visit included labs.  New labs ordered.

## 2024-03-05 ENCOUNTER — Inpatient Hospital Stay (HOSPITAL_BASED_OUTPATIENT_CLINIC_OR_DEPARTMENT_OTHER)

## 2024-03-05 ENCOUNTER — Inpatient Hospital Stay

## 2024-03-05 ENCOUNTER — Other Ambulatory Visit: Payer: Self-pay | Admitting: Cardiology

## 2024-03-05 ENCOUNTER — Other Ambulatory Visit: Payer: Self-pay

## 2024-03-05 VITALS — BP 118/84 | HR 76 | Temp 97.8°F | Resp 18 | Ht 69.0 in | Wt 175.0 lb

## 2024-03-05 DIAGNOSIS — I10 Essential (primary) hypertension: Secondary | ICD-10-CM

## 2024-03-05 DIAGNOSIS — Z9189 Other specified personal risk factors, not elsewhere classified: Secondary | ICD-10-CM | POA: Diagnosis not present

## 2024-03-05 DIAGNOSIS — C61 Malignant neoplasm of prostate: Secondary | ICD-10-CM

## 2024-03-05 DIAGNOSIS — E7849 Other hyperlipidemia: Secondary | ICD-10-CM

## 2024-03-05 DIAGNOSIS — I2583 Coronary atherosclerosis due to lipid rich plaque: Secondary | ICD-10-CM

## 2024-03-05 DIAGNOSIS — C7951 Secondary malignant neoplasm of bone: Secondary | ICD-10-CM | POA: Insufficient documentation

## 2024-03-05 LAB — PSA: Prostatic Specific Antigen: 0.1 ng/mL (ref 0.00–4.00)

## 2024-03-05 LAB — CMP (CANCER CENTER ONLY)
ALT: 13 U/L (ref 0–44)
AST: 15 U/L (ref 15–41)
Albumin: 4 g/dL (ref 3.5–5.0)
Alkaline Phosphatase: 67 U/L (ref 38–126)
Anion gap: 3 — ABNORMAL LOW (ref 5–15)
BUN: 12 mg/dL (ref 8–23)
CO2: 31 mmol/L (ref 22–32)
Calcium: 9.8 mg/dL (ref 8.9–10.3)
Chloride: 105 mmol/L (ref 98–111)
Creatinine: 0.97 mg/dL (ref 0.61–1.24)
GFR, Estimated: >60 mL/min (ref >60)
Glucose, Bld: 117 mg/dL — ABNORMAL HIGH (ref 70–99)
Potassium: 4.9 mmol/L (ref 3.5–5.1)
Sodium: 139 mmol/L (ref 135–145)
Total Bilirubin: 1.1 mg/dL (ref 0.0–1.2)
Total Protein: 6.8 g/dL (ref 6.5–8.1)

## 2024-03-05 LAB — SAMPLE TO BLOOD BANK

## 2024-03-05 LAB — CBC WITH DIFFERENTIAL (CANCER CENTER ONLY)
Abs Immature Granulocytes: 0.02 K/uL (ref 0.00–0.07)
Basophils Absolute: 0 K/uL (ref 0.0–0.1)
Basophils Relative: 1 %
Eosinophils Absolute: 0.1 K/uL (ref 0.0–0.5)
Eosinophils Relative: 2 %
HCT: 33.5 % — ABNORMAL LOW (ref 39.0–52.0)
Hemoglobin: 11.9 g/dL — ABNORMAL LOW (ref 13.0–17.0)
Immature Granulocytes: 1 %
Lymphocytes Relative: 15 %
Lymphs Abs: 0.6 K/uL — ABNORMAL LOW (ref 0.7–4.0)
MCH: 31.5 pg (ref 26.0–34.0)
MCHC: 35.5 g/dL (ref 30.0–36.0)
MCV: 88.6 fL (ref 80.0–100.0)
Monocytes Absolute: 0.4 K/uL (ref 0.1–1.0)
Monocytes Relative: 10 %
Neutro Abs: 3.1 K/uL (ref 1.7–7.7)
Neutrophils Relative %: 71 %
Platelet Count: 184 K/uL (ref 150–400)
RBC: 3.78 MIL/uL — ABNORMAL LOW (ref 4.22–5.81)
RDW: 13 % (ref 11.5–15.5)
WBC Count: 4.3 K/uL (ref 4.0–10.5)
nRBC: 0 % (ref 0.0–0.2)

## 2024-03-05 NOTE — Assessment & Plan Note (Addendum)
 On amlodipine  and hydrochlorothiazide  and benazepril.

## 2024-03-07 ENCOUNTER — Other Ambulatory Visit: Payer: Self-pay | Admitting: Cardiology

## 2024-03-07 DIAGNOSIS — I10 Essential (primary) hypertension: Secondary | ICD-10-CM

## 2024-04-03 ENCOUNTER — Other Ambulatory Visit: Payer: Self-pay | Admitting: Cardiology

## 2024-04-03 DIAGNOSIS — E7849 Other hyperlipidemia: Secondary | ICD-10-CM

## 2024-04-03 DIAGNOSIS — I2583 Coronary atherosclerosis due to lipid rich plaque: Secondary | ICD-10-CM

## 2024-04-04 ENCOUNTER — Other Ambulatory Visit: Payer: Self-pay | Admitting: Cardiology

## 2024-04-04 DIAGNOSIS — I10 Essential (primary) hypertension: Secondary | ICD-10-CM

## 2024-04-19 ENCOUNTER — Other Ambulatory Visit: Payer: Self-pay | Admitting: Cardiology

## 2024-04-19 DIAGNOSIS — E7849 Other hyperlipidemia: Secondary | ICD-10-CM

## 2024-04-19 DIAGNOSIS — I10 Essential (primary) hypertension: Secondary | ICD-10-CM

## 2024-04-19 DIAGNOSIS — I2583 Coronary atherosclerosis due to lipid rich plaque: Secondary | ICD-10-CM

## 2024-04-20 NOTE — Progress Notes (Unsigned)
 Subjective:    Patient ID: Nicholas Paul, male    DOB: January 27, 1951, 73 y.o.   MRN: [id number]     HPI Nicholas Paul is here for follow up of his chronic medical problems.  Overall doing well.  Still working, but planning on retiring next year.  Still on treatment for metastatic prostate cancer which is working well.  Started sore throat last week,  has cough that is productive - yellow-green.   Taking mucinex.  He is not sure if it is bacterial or not-symptoms very by day.  Regular exercise - gym 2-3 times a week.      Medications and allergies reviewed with patient and updated if appropriate.  Current Outpatient Medications on File Prior to Visit  Medication Sig Dispense Refill   amLODipine  (NORVASC ) 5 MG tablet TAKE 1 TABLET (5 MG TOTAL) BY MOUTH DAILY. 90 tablet 3   Ascorbic Acid (VITAMIN C PO) Take 1 tablet by mouth daily.     aspirin  EC 81 MG tablet Take 81 mg by mouth in the morning.     atorvastatin  (LIPITOR) 10 MG tablet TAKE 1 TABLET BY MOUTH EVERY DAY 15 tablet 0   B Complex-C (B-COMPLEX WITH VITAMIN C) tablet Take 1 tablet by mouth daily.     benazepril (LOTENSIN) 40 MG tablet TAKE 1 TABLET BY MOUTH EVERY DAY 15 tablet 0   Cholecalciferol (VITAMIN D ) 125 MCG (5000 UT) CAPS Take 125 mcg by mouth daily.      Coenzyme Q10 (CO Q 10 PO) Take 1 capsule by mouth daily.     darolutamide (NUBEQA) 300 MG tablet Take 600 mg by mouth 2 (two) times daily with a meal.     famotidine (PEPCID) 10 MG tablet Take 10 mg by mouth as needed for heartburn or indigestion.     fluticasone  (FLONASE ) 50 MCG/ACT nasal spray Place 1 spray into both nostrils daily. 15.8 mL 0   hydrochlorothiazide  (MICROZIDE ) 12.5 MG capsule TAKE 1 CAPSULE BY MOUTH EVERY DAY 90 capsule 3   Naproxen  Sod-diphenhydrAMINE (ALEVE  PM) 220-25 MG TABS Take 1 tablet by mouth at bedtime as needed (sleep/pain.).     Omega-3 Fatty Acids (OMEGA 3 PO) Take 1 capsule by mouth daily.      psyllium (METAMUCIL) 58.6 % packet Take  2 packets by mouth 2 (two) times daily.      relugolix (ORGOVYX) 120 MG tablet Take 120 mg by mouth in the morning.     No current facility-administered medications on file prior to visit.     Review of Systems  Constitutional:  Positive for fatigue. Negative for appetite change, chills and fever.  HENT:  Positive for congestion, sinus pressure and sore throat. Negative for ear pain.   Respiratory:  Positive for cough (productive - yellow- green). Negative for shortness of breath and wheezing.   Cardiovascular:  Negative for chest pain, palpitations and leg swelling.  Gastrointestinal:  Negative for abdominal pain, constipation and diarrhea.       No gerd  Neurological:  Positive for headaches. Negative for dizziness and light-headedness.       Objective:   Vitals:   04/21/24 1022  BP: 130/82  Pulse: 85  Resp: 17  Temp: 98.5 F (36.9 C)   BP Readings from Last 3 Encounters:  04/21/24 130/82  03/05/24 118/84  02/16/24 114/65   Wt Readings from Last 3 Encounters:  04/21/24 175 lb (79.4 kg)  03/05/24 175 lb (79.4 kg)  11/29/23 173 lb (78.5  kg)   Body mass index is 25.84 kg/m.    Physical Exam Constitutional:      General: He is not in acute distress.    Appearance: Normal appearance. He is not ill-appearing.  HENT:     Head: Normocephalic and atraumatic.  Eyes:     Conjunctiva/sclera: Conjunctivae normal.  Cardiovascular:     Rate and Rhythm: Normal rate and regular rhythm.     Heart sounds: Normal heart sounds.  Pulmonary:     Effort: Pulmonary effort is normal. No respiratory distress.     Breath sounds: Normal breath sounds. No wheezing or rales.  Abdominal:     General: There is no distension.     Palpations: Abdomen is soft.     Tenderness: There is no abdominal tenderness.  Musculoskeletal:     Right lower leg: No edema.     Left lower leg: No edema.  Skin:    General: Skin is warm and dry.     Findings: No rash.  Neurological:     Mental Status:  He is alert. Mental status is at baseline.  Psychiatric:        Mood and Affect: Mood normal.        Lab Results  Component Value Date   WBC 4.3 03/05/2024   HGB 11.9 (L) 03/05/2024   HCT 33.5 (L) 03/05/2024   PLT 184 03/05/2024   GLUCOSE 117 (H) 03/05/2024   CHOL 131 10/15/2023   TRIG 147.0 10/15/2023   HDL 61.10 10/15/2023   LDLCALC 40 10/15/2023   ALT 13 03/05/2024   AST 15 03/05/2024   NA 139 03/05/2024   K 4.9 03/05/2024   CL 105 03/05/2024   CREATININE 0.97 03/05/2024   BUN 12 03/05/2024   CO2 31 03/05/2024   TSH 0.61 10/15/2023   PSA 3.04 11/06/2018   HGBA1C 5.7 10/15/2023     Assessment & Plan:    See Problem List for Assessment and Plan of chronic medical problems.

## 2024-04-20 NOTE — Patient Instructions (Incomplete)
      Medications changes include :   Augmentin  twice daily x 1 week      Return in about 1 year (around 04/21/2025) for follow up.

## 2024-04-21 ENCOUNTER — Ambulatory Visit: Admitting: Internal Medicine

## 2024-04-21 VITALS — BP 130/82 | HR 85 | Temp 98.5°F | Resp 17 | Ht 69.0 in | Wt 175.0 lb

## 2024-04-21 DIAGNOSIS — J209 Acute bronchitis, unspecified: Secondary | ICD-10-CM | POA: Insufficient documentation

## 2024-04-21 DIAGNOSIS — R7303 Prediabetes: Secondary | ICD-10-CM | POA: Diagnosis not present

## 2024-04-21 DIAGNOSIS — I10 Essential (primary) hypertension: Secondary | ICD-10-CM

## 2024-04-21 DIAGNOSIS — J0100 Acute maxillary sinusitis, unspecified: Secondary | ICD-10-CM | POA: Diagnosis not present

## 2024-04-21 DIAGNOSIS — E559 Vitamin D deficiency, unspecified: Secondary | ICD-10-CM | POA: Insufficient documentation

## 2024-04-21 DIAGNOSIS — E7800 Pure hypercholesterolemia, unspecified: Secondary | ICD-10-CM

## 2024-04-21 MED ORDER — AMOXICILLIN-POT CLAVULANATE 875-125 MG PO TABS: 1.0000 | ORAL_TABLET | Freq: Two times a day (BID) | ORAL | 0 refills | Status: AC

## 2024-04-21 NOTE — Assessment & Plan Note (Signed)
 Chronic Blood pressure well controlled Recent CMP reviewed Continue amlodipine  5 mg daily, benazepril 40 mg daily, HCTZ 12.5 mg daily

## 2024-04-21 NOTE — Assessment & Plan Note (Signed)
 Chronic Continue taking vitamin D 

## 2024-04-21 NOTE — Assessment & Plan Note (Signed)
 Acute Symptoms concerning for acute bacterial bronchitis Given immunocompromise status will start antibiotic-Augmentin  875-125 mg twice daily x 7 days Continue Mucinex Call return if no improvement

## 2024-04-21 NOTE — Assessment & Plan Note (Addendum)
 Chronic Lipids last year were well-controlled-Will defer blood work since he is getting blood work done constantly Continue atorvastatin  10 mg daily Continue regular exercise and healthy diet

## 2024-04-21 NOTE — Assessment & Plan Note (Addendum)
 Chronic Lab Results  Component Value Date   HGBA1C 5.7 10/15/2023   Low sugar / carb diet Stressed regular exercise

## 2024-05-21 ENCOUNTER — Other Ambulatory Visit: Payer: Self-pay

## 2024-05-21 DIAGNOSIS — C61 Malignant neoplasm of prostate: Secondary | ICD-10-CM

## 2024-05-21 NOTE — Progress Notes (Signed)
 Patient had PSA, CBC, and Testosterone  completed at [facility] on 05/20/24.  Copy will be scanned into system.   PSA 0.4 Testosterone  < 3  CMP will be collected on upcoming lab appointment.

## 2024-05-22 ENCOUNTER — Other Ambulatory Visit: Payer: Self-pay | Admitting: Cardiology

## 2024-05-22 MED ORDER — HYDROCHLOROTHIAZIDE 12.5 MG PO CAPS: 12.5000 mg | ORAL_CAPSULE | Freq: Every day | ORAL | 0 refills | Status: AC

## 2024-05-25 ENCOUNTER — Encounter: Payer: Self-pay | Admitting: Cardiology

## 2024-05-26 MED ORDER — AMLODIPINE BESYLATE 5 MG PO TABS: 5.0000 mg | ORAL_TABLET | Freq: Every day | ORAL | 0 refills | Status: DC

## 2024-05-27 ENCOUNTER — Encounter: Payer: Self-pay | Admitting: Urology

## 2024-06-03 NOTE — Progress Notes (Unsigned)
 [facility] OFFICE PROGRESS NOTE  Patient Care Team: Geofm Glade PARAS, MD as PCP - General (Internal Medicine) Pietro Redell RAMAN, MD as PCP - Cardiology (Cardiology) Terence Deward MICAEL JULIANNA., MD as Consulting Physician (Urology) [facility] [place] as Consulting Physician (Optometry) Vertell Pont, RN as Oncology Nurse Navigator Tina Pauletta BROCKS, MD as Consulting Physician (Oncology) Abigail Maude POUR [facility])  74 y.o.man with h/o hypertension, hyperlipidemia, BPH here for follow up for metastatic prostate cancer.  He has excellent performance status despite his age.    Primary Urologist: Dr. Nieves. Current diagnosis: mHSPC.  Initial diagnosis: mHSPC. PSA 12.4 with GG5 (Gleason 4+5=9). High volume disease with nonregional and regional lymph nodes, and 4 sites of bone metastases. Germline: Negative genetic testing on the CancerNext-Expanded+RNAinsight pane  Somatic: not yet. Will perform upon progression Treatment: 12/2022. Started relugolix and 01/17/23 darolutamide  03/08/23 C1D1 docetazel 03/29/23 C2D1 docetaxel  04/18/23 PSA <0.1 04/18/24 C3D1 docetaxel  05/17/23 C4 docetaxel  08/21/23-10/17/23 radiation IMRT to prostate plus SBRT to oligometastatic lesion in the sternum  11/2023 PSA <0.1 03/05/24 PSA 0.1  From [facility] on 05/20/24.    PSA 0.4 Testosterone  < 3 Hemoglobin 12.3  Overall feeling well.  Assessment & Plan Prostate cancer metastatic to bone [facility]) Continue Orgovyx and darolutamide Will obtain PET in March Follow up 3/19 at 3pm w/me to go over result.  At risk for side effect of medication 03/2023 normal bone mineral density study calcium  (1000-1200 mg daily from food and supplements) and vitamin D3 (1000 IU daily) Healthy diet to prevent diabetes Control HTN, HLD. Weight-bearing exercises (30 minutes per day) Limit alcohol consumption and avoid smoking He should continue to see his cardiology team to optimize his cardiac condition while on treatment  for active prostate cancer CBC, CMP, T&S next months Hypercholesterolemia No side effects with ARPI. Continue atorvastatin  10 mg daily Continue regular exercise and healthy diet   Orders Placed This Encounter  Procedures   NM PET (PSMA) SKULL TO MID THIGH    Standing Status:   Future    Expected Date:   07/17/2024    Expiration Date:   06/04/2025    If indicated for the ordered procedure, I authorize the administration of a radiopharmaceutical per Radiology protocol:   Yes    Preferred imaging location?:   [facility]ryle Darra Pauletta BROCKS Tina, MD  INTERVAL HISTORY: Patient returns for follow-up. He is feeling well. He saw Dr. Nieves. PSA borderline elevated.  No trouble urinating or new pain.   Oncology History  Prostate cancer metastatic to bone (HCC)  07/15/2017 Tumor Marker   PSA 1.89   09/27/2017 Tumor Marker   PSA 2.26   11/06/2018 Tumor Marker   PSA 3.04   04/22/2019 Tumor Marker   PSA 2.97   12/23/2019 Tumor Marker   PSA 1.76   12/15/2020 Tumor Marker   PSA 2.43   12/18/2021 Tumor Marker   PSA 3.76   11/27/2022 Surgery   TURP for BPH with Lower urinary tract symptoms. High risk T1b GG5 (Gleason 4+5=9) in 30% of resected tissue.   12/24/2022 Tumor Marker   PSA 12.4   01/01/2023 PET scan   PSMA PET Radiotracer avid glandular noted on the right aspect of the prostate.  Potential more central activity difficult to separate from the urine activity. Focal radiotracer activity within the right seminal vesicle consistent with prostate adenocarcinoma metastases Radiotracer avid right and left iliac lymph nodes consistent with nodal metastases.  Radiotracer avid periaortic  retroperitoneal lymph node consistent with nodal metastasis Skeletal metastases including sternum, L3 vertebral body, and left iliac bone.   01/09/2023 -  Chemotherapy   Started Orgovyx 01/17/23 darolutamide   01/17/2023 Initial Diagnosis   Prostate cancer metastatic to bone (HCC)   01/18/2023 Cancer Staging    Staging form: Prostate, AJCC 8th Edition - Clinical: Stage IVB (cT1b, cN1, cM1b, PSA: 12.4, Grade Group: 5) - Signed by Tina Pauletta BROCKS, MD on 01/18/2023 Prostate specific antigen (PSA) range: 10 to 19 Gleason primary pattern: 4 Gleason secondary pattern: 5 Histologic grading system: 5 grade system   02/13/2023 Genetic Testing   [employer] Negative for pathologic variant   03/01/2023 Genetic Testing   Negative genetic testing on the CancerNext-Expanded+RNAinsight panel.  The report date is 03/01/2023.  The CancerNext-Expanded gene panel offered by [facility] and includes sequencing and rearrangement analysis for the following 77 genes: AIP, ALK, APC*, ATM*, AXIN2, BAP1, BARD1, BMPR1A, BRCA1*, BRCA2*, BRIP1*, CDC73, CDH1*, CDK4, CDKN1B, CDKN2A, CHEK2*, CTNNA1, DICER1, FH, FLCN, KIF1B, LZTR1, MAX, MEN1, MET, MLH1*, MSH2*, MSH3, MSH6*, MUTYH*, NF1*, NF2, NTHL1, PALB2*, PHOX2B, PMS2*, POT1, PRKAR1A, PTCH1, PTEN*, RAD51C*, RAD51D*, RB1, RET, SDHA, SDHAF2, SDHB, SDHC, SDHD, SMAD4, SMARCA4, SMARCB1, SMARCE1, STK11, SUFU, TMEM127, TP53*, TSC1, TSC2, and VHL (sequencing and deletion/duplication); EGFR, EGLN1, HOXB13, KIT, MITF, PDGFRA, POLD1, and POLE (sequencing only); EPCAM and GREM1 (deletion/duplication only). DNA and RNA analyses performed for * genes.    03/04/2023 Genetic Testing   Patient has genetic testing done for prostate cancer. Results were negative for pathologic mutations from [employer] 71 genes panel.   03/08/2023 - 05/20/2023 Chemotherapy   Treatment Plan : PROSTATE Docetaxel  (75) q21d x 4 cycles. PSA <0.1 after C2.   06/18/2023 PET scan   PSMA PET IMPRESSION: 1. 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 2. Persistent right-sided prostatic tracer affinity, suspicious for residual disease. 3. Relatively similar osseous metastasis, presumably having undergone interval healing is evidenced by increased sclerosis.    08/21/2023 - 10/17/2023 Radiation Therapy   IMRT to prostate with boost and sternum.       PHYSICAL EXAMINATION: ECOG PERFORMANCE STATUS: 0  Vitals:   06/04/24 0941  BP: 119/67  Pulse: 64  Resp: 16  Temp: 97.7 F (36.5 C)  SpO2: 100%   Filed Weights   06/04/24 0941  Weight: 175 lb (79.4 kg)    GENERAL: alert, no distress and comfortable SKIN: skin color normal NECK: No palpable mass LYMPH:  no palpable cervical lymphadenopathy  LUNGS: clear to auscultation and no wheeze or rales with normal breathing effort HEART: regular rate & rhythm  ABDOMEN: abdomen soft, non-tender and nondistended. Musculoskeletal: no point tenderness   Relevant data reviewed during this visit included labs.  New labs ordered.

## 2024-06-03 NOTE — Assessment & Plan Note (Addendum)
 Continue Orgovyx and darolutamide Will obtain PET in March Follow up 3/19 at 3pm w/me to go over result.

## 2024-06-04 ENCOUNTER — Inpatient Hospital Stay (HOSPITAL_BASED_OUTPATIENT_CLINIC_OR_DEPARTMENT_OTHER)

## 2024-06-04 ENCOUNTER — Inpatient Hospital Stay

## 2024-06-04 VITALS — BP 119/67 | HR 64 | Temp 97.7°F | Resp 16 | Ht 69.0 in | Wt 175.0 lb

## 2024-06-04 DIAGNOSIS — Z9189 Other specified personal risk factors, not elsewhere classified: Secondary | ICD-10-CM

## 2024-06-04 DIAGNOSIS — C61 Malignant neoplasm of prostate: Secondary | ICD-10-CM

## 2024-06-04 DIAGNOSIS — C7951 Secondary malignant neoplasm of bone: Secondary | ICD-10-CM | POA: Diagnosis not present

## 2024-06-04 DIAGNOSIS — E7800 Pure hypercholesterolemia, unspecified: Secondary | ICD-10-CM | POA: Diagnosis not present

## 2024-06-04 LAB — CMP (CANCER CENTER ONLY)
ALT: 18 U/L (ref 0–44)
AST: 20 U/L (ref 15–41)
Albumin: 4.4 g/dL (ref 3.5–5.0)
Alkaline Phosphatase: 86 U/L (ref 38–126)
Anion gap: 10 (ref 5–15)
BUN: 13 mg/dL (ref 8–23)
CO2: 29 mmol/L (ref 22–32)
Calcium: 9.9 mg/dL (ref 8.9–10.3)
Chloride: 100 mmol/L (ref 98–111)
Creatinine: 1.03 mg/dL (ref 0.61–1.24)
GFR, Estimated: >60 mL/min
Glucose, Bld: 126 mg/dL — ABNORMAL HIGH (ref 70–99)
Potassium: 4.9 mmol/L (ref 3.5–5.1)
Sodium: 138 mmol/L (ref 135–145)
Total Bilirubin: 1.2 mg/dL (ref 0.0–1.2)
Total Protein: 7.3 g/dL (ref 6.5–8.1)

## 2024-06-04 NOTE — Assessment & Plan Note (Addendum)
 03/2023 normal bone mineral density study calcium  (1000-1200 mg daily from food and supplements) and vitamin D3 (1000 IU daily) Healthy diet to prevent diabetes Control HTN, HLD. Weight-bearing exercises (30 minutes per day) Limit alcohol consumption and avoid smoking He should continue to see his cardiology team to optimize his cardiac condition while on treatment for active prostate cancer CBC, CMP, T&S next months

## 2024-06-04 NOTE — Assessment & Plan Note (Addendum)
 No side effects with ARPI. Continue atorvastatin  10 mg daily Continue regular exercise and healthy diet

## 2024-06-15 NOTE — Progress Notes (Unsigned)
 "    HPI: Follow-up coronary artery disease. Echocardiogram February 2013 showed normal LV function, mild left ventricular hypertrophy, grade 1 diastolic dysfunction. Patient had a coronary CTA in October 2019 showing mixed plaque in the proximal and mid LAD without significant stenosis. Calcium  score 15. Since last seen   Current Outpatient Medications  Medication Sig Dispense Refill   amLODipine  (NORVASC ) 5 MG tablet Take 1 tablet (5 mg total) by mouth daily. 30 tablet 0   amoxicillin -clavulanate (AUGMENTIN ) 875-125 MG tablet Take 1 tablet by mouth every 12 (twelve) hours. 14 tablet 0   Ascorbic Acid (VITAMIN C PO) Take 1 tablet by mouth daily.     aspirin  EC 81 MG tablet Take 81 mg by mouth in the morning.     atorvastatin  (LIPITOR) 10 MG tablet Take 1 tablet (10 mg total) by mouth daily. Pt must keep upcoming followup appt with Cardiology in February 2026 for any more refills. Thank You 90 tablet 0   B Complex-C (B-COMPLEX WITH VITAMIN C) tablet Take 1 tablet by mouth daily.     benazepril (LOTENSIN) 40 MG tablet Take 1 tablet (40 mg total) by mouth daily. Pt must keep upcoming followup appt with Cardiology in February 2026 for any more refills. Thank You 90 tablet 0   Cholecalciferol (VITAMIN D ) 125 MCG (5000 UT) CAPS Take 125 mcg by mouth daily.      Coenzyme Q10 (CO Q 10 PO) Take 1 capsule by mouth daily.     darolutamide (NUBEQA) 300 MG tablet Take 600 mg by mouth 2 (two) times daily with a meal.     famotidine (PEPCID) 10 MG tablet Take 10 mg by mouth as needed for heartburn or indigestion.     fluticasone  (FLONASE ) 50 MCG/ACT nasal spray Place 1 spray into both nostrils daily. 15.8 mL 0   hydrochlorothiazide  (MICROZIDE ) 12.5 MG capsule Take 1 capsule (12.5 mg total) by mouth daily. 90 capsule 0   Naproxen  Sod-diphenhydrAMINE (ALEVE  PM) 220-25 MG TABS Take 1 tablet by mouth at bedtime as needed (sleep/pain.).     Omega-3 Fatty Acids (OMEGA 3 PO) Take 1 capsule by mouth daily.       psyllium (METAMUCIL) 58.6 % packet Take 2 packets by mouth 2 (two) times daily.      relugolix (ORGOVYX) 120 MG tablet Take 120 mg by mouth in the morning.     No current facility-administered medications for this visit.     Past Medical History:  Diagnosis Date   BPH (benign prostatic hyperplasia)    urologist--- dr nieves   Cancer [facility])    Coronary artery disease cardiologist--- katheryn gerhardt NP   last CTA 02-17-2018  nonobstructive proximal and mid LAD, calcium  score 15;   nuclear stress test 06-09-2003 in epic,  no ischemia w/ ef 59%   Family history of bladder cancer    Family history of prostate cancer    GERD (gastroesophageal reflux disease)    H/O: rheumatic fever    noted after tonsillectomy   Head injury, closed, initial encounter 03/26/2019   Hyperlipidemia    Hypertension    followed by cardiology   IBS (irritable bowel syndrome)    Dr Avram   Internal hemorrhoids    Pulmonary nodule    4 mm RML nodule on CT scan in 12/12. Needs follow up in one year.       Past Surgical History:  Procedure Laterality Date   COLONOSCOPY W/ POLYPECTOMY  2006   internal hemorrhoids 2011  GOLD SEED IMPLANT N/A 08/09/2023   Procedure: INSERTION, GOLD SEEDS;  Surgeon: Devere Lonni Righter, MD;  Location[facility] OR;  Service: Urology;  Laterality: N/A;   IR IMAGING GUIDED PORT INSERTION  03/05/2023   IR REMOVAL TUN ACCESS W/ PORT W/O FL MOD SED  07/15/2023   PROSTATE BIOPSY     SHOULDER ARTHROSCOPY WITH OPEN ROTATOR CUFF REPAIR Right 2022   SPACE OAR INSTILLATION N/A 08/09/2023   Procedure: INJECTION, HYDROGEL SPACER;  Surgeon: Devere Lonni Righter, MD;  Location: [facility] OR;  Service: Urology;  Laterality: N/A;  SPACE OAR   THULIUM LASER TURP (TRANSURETHRAL RESECTION OF PROSTATE) N/A 07/14/2019   Procedure: THULIUM LASER TURP (TRANSURETHRAL RESECTION OF PROSTATE);  Surgeon: Nieves Cough, MD;  Location: [facility];  Service: Urology;  Laterality: N/A;    TONSILLECTOMY AND ADENOIDECTOMY      Social History   Socioeconomic History   Marital status: Married    Spouse name: Olam   Number of children: 2   Years of education: Not on file   Highest education level: Some college, no degree  Occupational History    Employer: [employer]  Tobacco Use   Smoking status: Never    Passive exposure: Never   Smokeless tobacco: Never  Vaping Use   Vaping status: Never Used  Substance and Sexual Activity   Alcohol use: Yes    Alcohol/week: 3.0 standard drinks of alcohol    Types: 3 Glasses of wine per week   Drug use: Never   Sexual activity: Yes    Comment: vasectomy  Other Topics Concern   Not on file  Social History Narrative   ** Merged History Encounter **      Lives  at home with wife   Social Drivers of Health   Tobacco Use: Low Risk (04/20/2024)   Patient History    Smoking Tobacco Use: Never    Smokeless Tobacco Use: Never    Passive Exposure: Never  Financial Resource Strain: Low Risk (04/20/2024)   Overall Financial Resource Strain (CARDIA)    Difficulty of Paying Living Expenses: Not hard at all  Food Insecurity: No Food Insecurity (04/20/2024)   Epic    Worried About Radiation Protection Practitioner of Food in the Last Year: Never true    Ran Out of Food in the Last Year: Never true  Transportation Needs: No Transportation Needs (04/20/2024)   Epic    Lack of Transportation (Medical): No    Lack of Transportation (Non-Medical): No  Physical Activity: Insufficiently Active (04/20/2024)   Exercise Vital Sign    Days of Exercise per Week: 2 days    Minutes of Exercise per Session: 60 min  Stress: No Stress Concern Present (04/20/2024)   [employer] of Occupational Health - Occupational Stress Questionnaire    Feeling of Stress: Not at all  Social Connections: Socially Integrated (04/20/2024)   Social Connection and Isolation Panel    Frequency of Communication with Friends and Family: Three times a week    Frequency of Social  Gatherings with Friends and Family: Once a week    Attends Religious Services: More than 4 times per year    Active Member of [employer] or Organizations: Yes    Attends Engineer, Structural: More than 4 times per year    Marital Status: Married  Catering Manager Violence: Not At Risk (07/22/2023)   Humiliation, Afraid, Rape, and Kick questionnaire    Fear of Current or Ex-Partner: No    Emotionally Abused:  No    Physically Abused: No    Sexually Abused: No  Depression (PHQ2-9): Low Risk (10/15/2023)   Depression (PHQ2-9)    PHQ-2 Score: 0  Alcohol Screen: Low Risk (04/20/2024)   Alcohol Screen    Last Alcohol Screening Score (AUDIT): 2  Housing: Low Risk (04/20/2024)   Epic    Unable to Pay for Housing in the Last Year: No    Number of Times Moved in the Last Year: 0    Homeless in the Last Year: No  Utilities: Not At Risk (07/22/2023)   AHC Utilities    Threatened with loss of utilities: No  Health Literacy: Adequate Health Literacy (07/22/2023)   B1300 Health Literacy    Frequency of need for help with medical instructions: Never    Family History  Problem Relation Age of Onset   Hyperlipidemia Mother    Hypertension Mother    Bladder Cancer Father 20       heavy smoker   Bladder Cancer Brother        dx. 30s, non-smoker   Hypertension Brother    Brain cancer Maternal Uncle 15       glioblastoma   Bladder Cancer Paternal Aunt    Prostate cancer Paternal Grandfather        d. 38s   Prostate cancer Cousin        mat first cousin   Stroke Neg Hx    Diabetes Neg Hx    Heart attack Neg Hx    Colon cancer Neg Hx    Colon polyps Neg Hx    Esophageal cancer Neg Hx    Rectal cancer Neg Hx    Stomach cancer Neg Hx     ROS: no fevers or chills, productive cough, hemoptysis, dysphasia, odynophagia, melena, hematochezia, dysuria, hematuria, rash, seizure activity, orthopnea, PND, pedal edema, claudication. Remaining systems are negative.  Physical Exam: Well-developed  well-nourished in no acute distress.  Skin is warm and dry.  HEENT is normal.  Neck is supple.  Chest is clear to auscultation with normal expansion.  Cardiovascular exam is regular rate and rhythm.  Abdominal exam nontender or distended. No masses palpated. Extremities show no edema. neuro grossly intact  ECG- personally reviewed  A/P  1 coronary artery disease-nonobstructive on previous CTA.  He is not having chest pain.  Continue aspirin  and statin.  2 hyperlipidemia-continue statin.  Note he did not tolerate higher doses previously.  3 hypertension-blood pressure controlled.  Continue present medications.  Redell Shallow, MD    "

## 2024-06-17 ENCOUNTER — Other Ambulatory Visit: Payer: Self-pay | Admitting: Cardiology

## 2024-06-23 ENCOUNTER — Ambulatory Visit: Admitting: Cardiology

## 2024-07-17 ENCOUNTER — Encounter (HOSPITAL_COMMUNITY)

## 2024-07-21 ENCOUNTER — Ambulatory Visit

## 2024-07-22 ENCOUNTER — Ambulatory Visit

## 2024-07-30 ENCOUNTER — Inpatient Hospital Stay
